# Patient Record
Sex: Female | Born: 1940 | Race: White | Hispanic: No | Marital: Married | Smoking: Never smoker
Health system: Southern US, Community
[De-identification: ages and names within clinical notes are randomized; demographics above are authoritative.]

## PROBLEM LIST (undated history)

## (undated) DIAGNOSIS — I5020 Unspecified systolic (congestive) heart failure: Secondary | ICD-10-CM

## (undated) DIAGNOSIS — I350 Nonrheumatic aortic (valve) stenosis: Secondary | ICD-10-CM

## (undated) DIAGNOSIS — E46 Unspecified protein-calorie malnutrition: Secondary | ICD-10-CM

## (undated) DIAGNOSIS — E7800 Pure hypercholesterolemia, unspecified: Secondary | ICD-10-CM

## (undated) DIAGNOSIS — I340 Nonrheumatic mitral (valve) insufficiency: Secondary | ICD-10-CM

## (undated) DIAGNOSIS — I480 Paroxysmal atrial fibrillation: Secondary | ICD-10-CM

## (undated) DIAGNOSIS — C50919 Malignant neoplasm of unspecified site of unspecified female breast: Secondary | ICD-10-CM

## (undated) HISTORY — DX: Nonrheumatic aortic (valve) stenosis: I35.0

## (undated) HISTORY — DX: Pure hypercholesterolemia, unspecified: E78.00

## (undated) HISTORY — PX: NO PAST SURGERIES: SHX2092

---

## 1998-01-06 ENCOUNTER — Other Ambulatory Visit: Admission: RE | Admit: 1998-01-06 | Discharge: 1998-01-06 | Payer: Self-pay | Admitting: *Deleted

## 1998-03-12 ENCOUNTER — Ambulatory Visit: Admission: RE | Admit: 1998-03-12 | Discharge: 1998-03-12 | Payer: Self-pay | Admitting: General Surgery

## 1998-03-12 ENCOUNTER — Encounter: Payer: Self-pay | Admitting: General Surgery

## 1998-03-19 ENCOUNTER — Ambulatory Visit (HOSPITAL_COMMUNITY): Admission: RE | Admit: 1998-03-19 | Discharge: 1998-03-19 | Payer: Self-pay | Admitting: General Surgery

## 1999-01-07 ENCOUNTER — Other Ambulatory Visit: Admission: RE | Admit: 1999-01-07 | Discharge: 1999-01-07 | Payer: Self-pay | Admitting: *Deleted

## 2000-02-07 ENCOUNTER — Other Ambulatory Visit: Admission: RE | Admit: 2000-02-07 | Discharge: 2000-02-07 | Payer: Self-pay | Admitting: *Deleted

## 2001-03-21 ENCOUNTER — Other Ambulatory Visit: Admission: RE | Admit: 2001-03-21 | Discharge: 2001-03-21 | Payer: Self-pay | Admitting: *Deleted

## 2011-10-04 ENCOUNTER — Other Ambulatory Visit: Payer: Self-pay | Admitting: Family Medicine

## 2011-10-04 DIAGNOSIS — Z1211 Encounter for screening for malignant neoplasm of colon: Secondary | ICD-10-CM

## 2011-10-13 ENCOUNTER — Ambulatory Visit
Admission: RE | Admit: 2011-10-13 | Discharge: 2011-10-13 | Disposition: A | Discharge status: Home / Self Care | Payer: Federal, State, Local not specified - PPO | Source: Ambulatory Visit | Attending: Family Medicine | Admitting: Family Medicine

## 2011-10-13 DIAGNOSIS — Z1211 Encounter for screening for malignant neoplasm of colon: Secondary | ICD-10-CM

## 2017-05-26 ENCOUNTER — Other Ambulatory Visit: Payer: Self-pay | Admitting: Gastroenterology

## 2017-05-26 DIAGNOSIS — K921 Melena: Secondary | ICD-10-CM

## 2017-05-31 ENCOUNTER — Other Ambulatory Visit: Payer: Self-pay

## 2017-07-12 ENCOUNTER — Other Ambulatory Visit: Payer: Federal, State, Local not specified - PPO

## 2017-08-07 ENCOUNTER — Other Ambulatory Visit: Payer: Federal, State, Local not specified - PPO

## 2017-10-02 ENCOUNTER — Other Ambulatory Visit: Payer: Federal, State, Local not specified - PPO

## 2017-10-26 ENCOUNTER — Other Ambulatory Visit: Payer: Federal, State, Local not specified - PPO

## 2017-11-14 ENCOUNTER — Other Ambulatory Visit: Payer: Federal, State, Local not specified - PPO

## 2017-11-29 ENCOUNTER — Other Ambulatory Visit: Payer: Federal, State, Local not specified - PPO

## 2018-01-07 ENCOUNTER — Other Ambulatory Visit: Payer: Federal, State, Local not specified - PPO

## 2018-01-30 ENCOUNTER — Inpatient Hospital Stay
Admission: RE | Admit: 2018-01-30 | Discharge: 2018-01-30 | Disposition: A | Discharge status: Home / Self Care | Payer: Federal, State, Local not specified - PPO | Source: Ambulatory Visit | Attending: Gastroenterology | Admitting: Gastroenterology

## 2018-02-01 ENCOUNTER — Ambulatory Visit
Admission: RE | Admit: 2018-02-01 | Discharge: 2018-02-01 | Disposition: A | Discharge status: Home / Self Care | Payer: Federal, State, Local not specified - PPO | Source: Ambulatory Visit | Attending: Gastroenterology | Admitting: Gastroenterology

## 2018-02-01 DIAGNOSIS — K921 Melena: Secondary | ICD-10-CM

## 2019-05-29 IMAGING — CT CT VIRTUAL COLONOSCOPY DIAGNOSTIC
3 of 9 series · 15 of 46 positions shown, 17 images · non-contrast
Comparison: 10/13/2011

CLINICAL DATA: Blood in stool.

EXAM:
CT VIRTUAL COLONOSCOPY DIAGNOSTIC
TECHNIQUE: The patient was given a standard bowel preparation with Gastrografin
and barium for fluid and stool tagging respectively. The quality of
the bowel preparation is moderate. Automated CO2 insufflation of the
colon was performed prior to image acquisition and colonic
distention is moderate. Image post processing was used to generate a
3D endoluminal fly-through projection of the colon and to
electronically subtract stool/fluid as appropriate.

[Series 4: supine colon 1.50 br40 s3 supine thins · axial · 0.68mm/px · z∈[+1332,+1377]mm · 2 of 447 slices shown]
[im 45/447  soft-tissue]
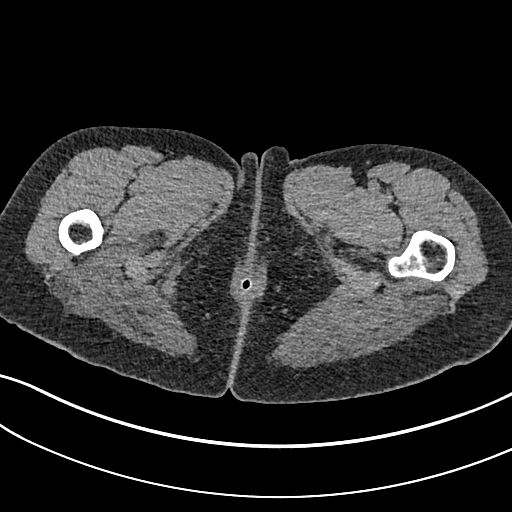
[im 90/447  soft-tissue]
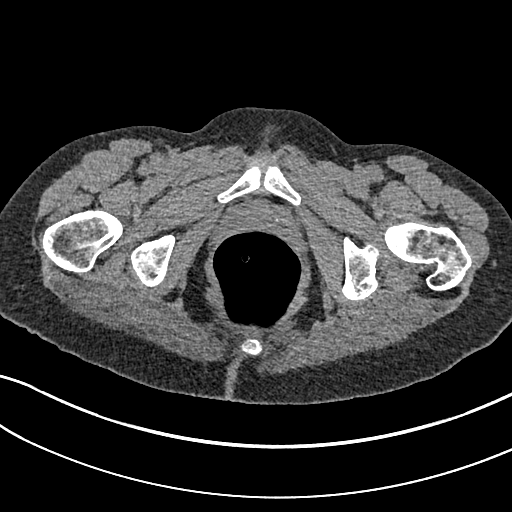

[Series 6: supine colon 3.00 br40 s3 cor cor supine · coronal · 0.68mm/px · 3 of 115 slices shown]
[im 29/115  soft-tissue]
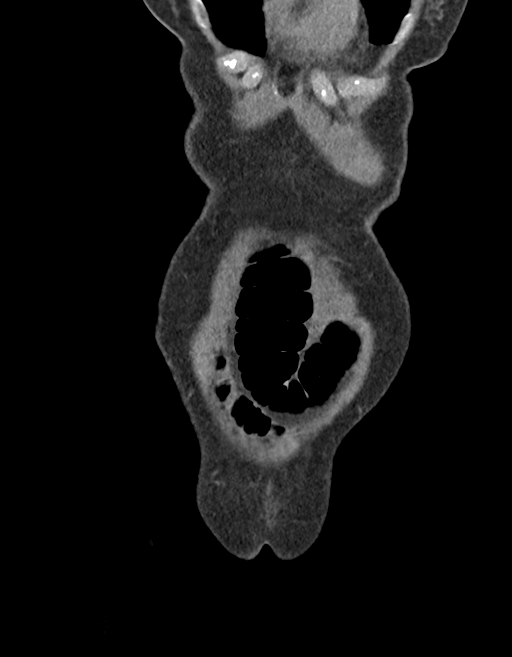
[im 58/115  soft-tissue]
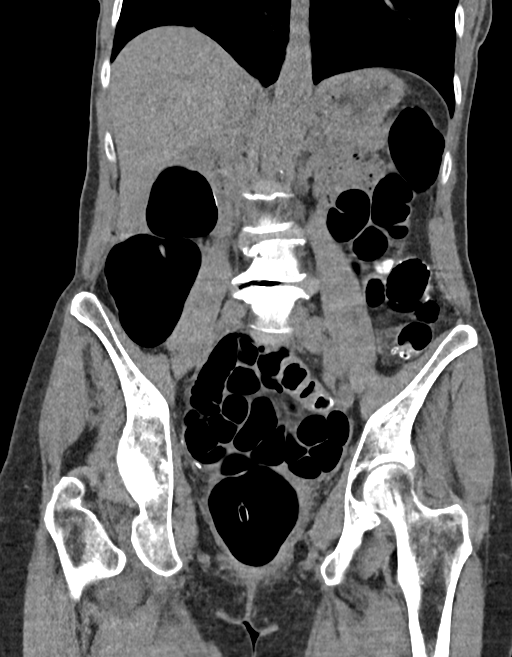
[im 86/115  soft-tissue]
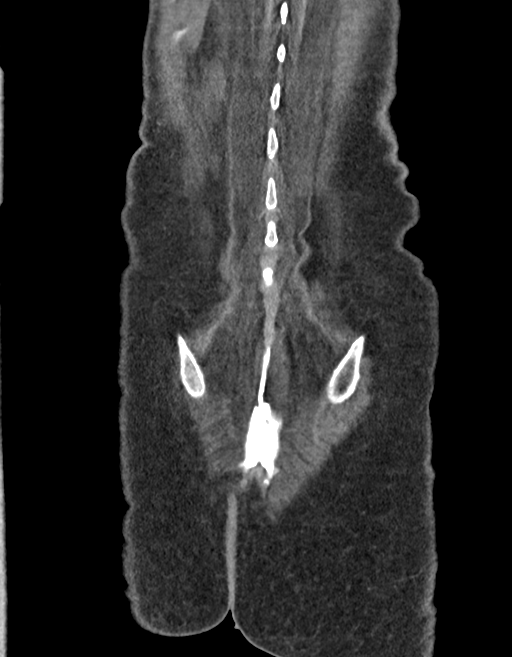

[Series 11: prone colon 1.50 br40 s3 prone thin · axial · 0.62mm/px · z∈[+1509,+1882]mm · 10 of 457 slices shown, 12 images]
[im 42/457  soft-tissue]
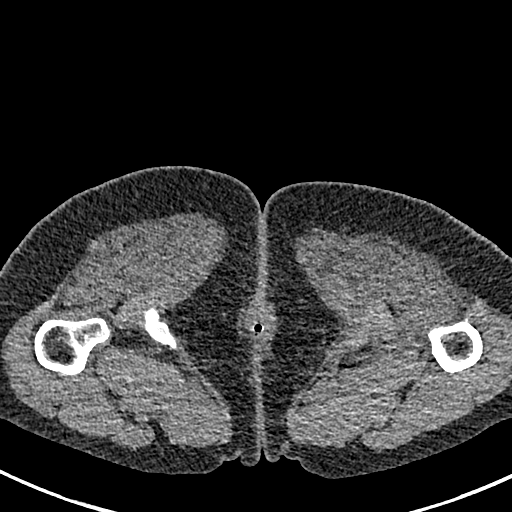
[im 42/457  bone]
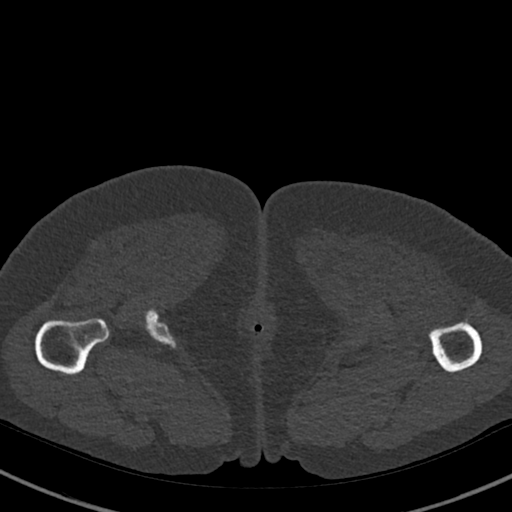
[im 83/457  soft-tissue]
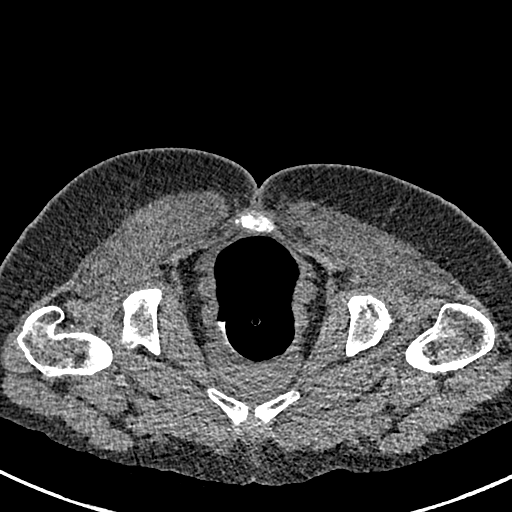
[im 125/457  soft-tissue]
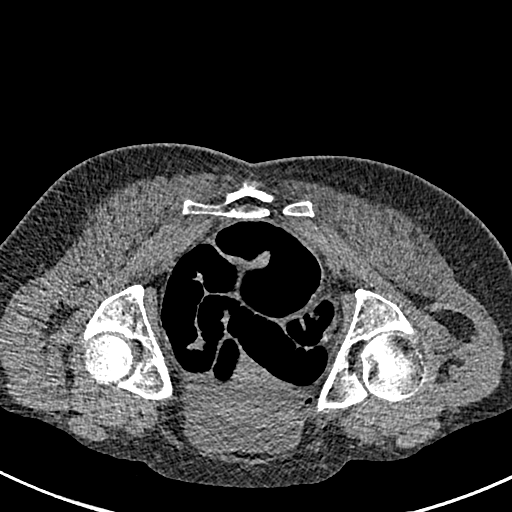
[im 166/457  soft-tissue]
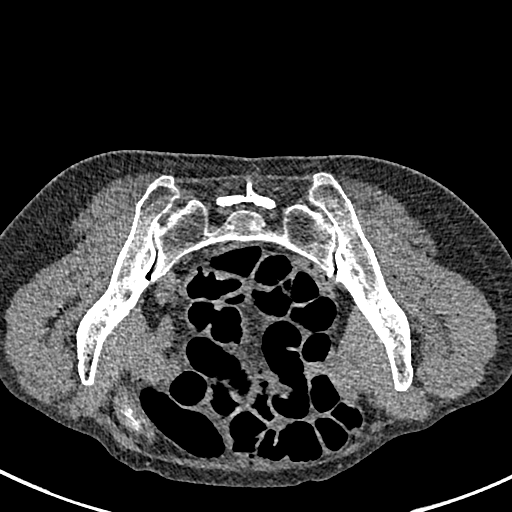
[im 208/457  soft-tissue]
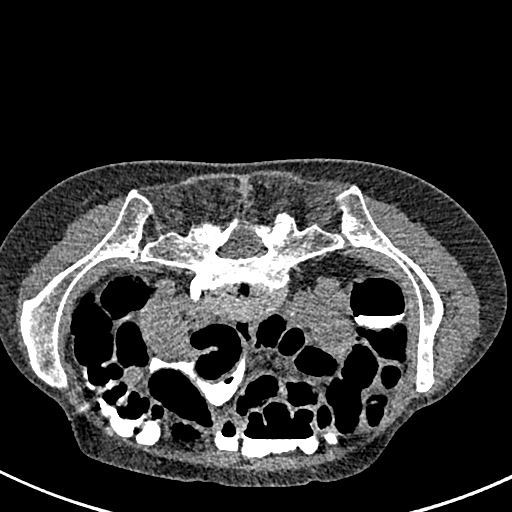
[im 249/457  soft-tissue]
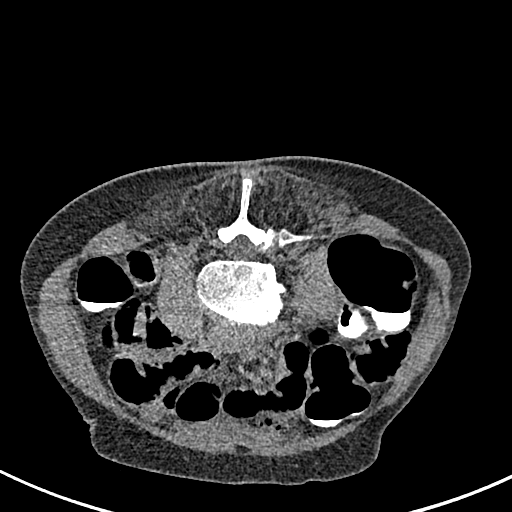
[im 291/457  soft-tissue]
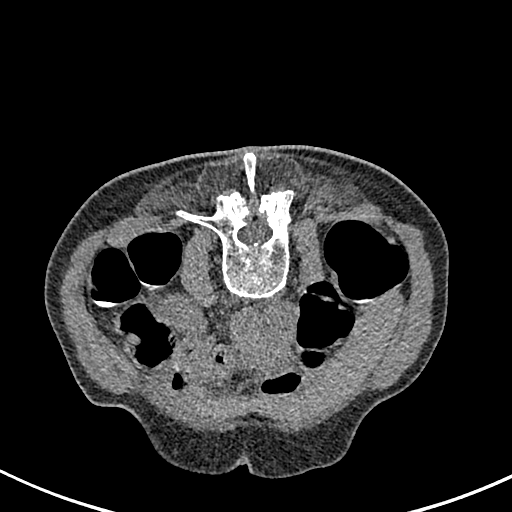
[im 332/457  soft-tissue]
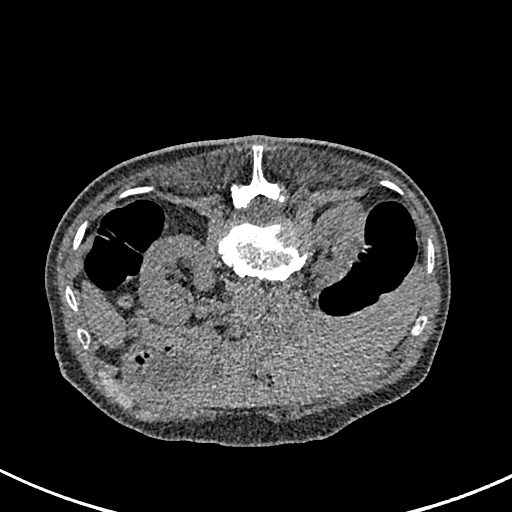
[im 374/457  soft-tissue]
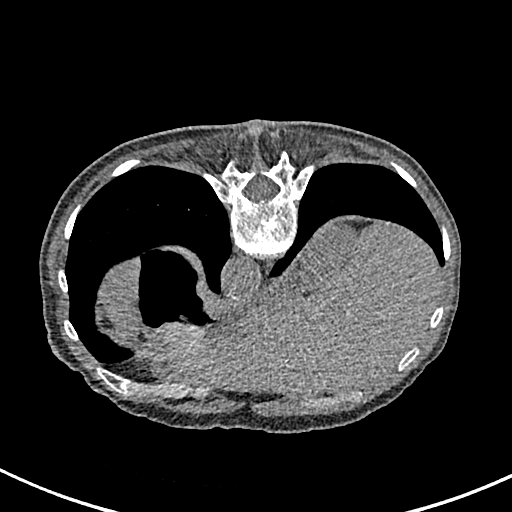
[im 374/457  bone]
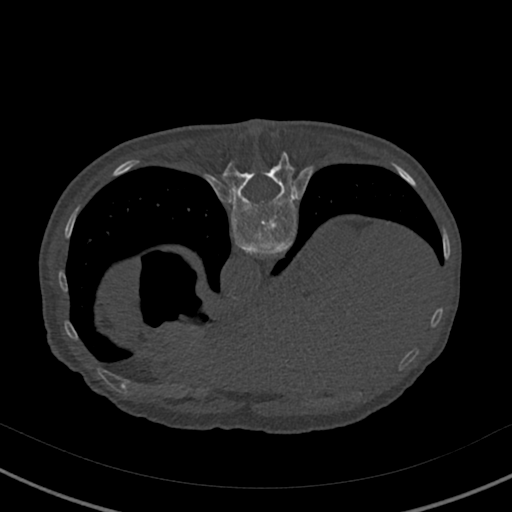
[im 415/457  soft-tissue]
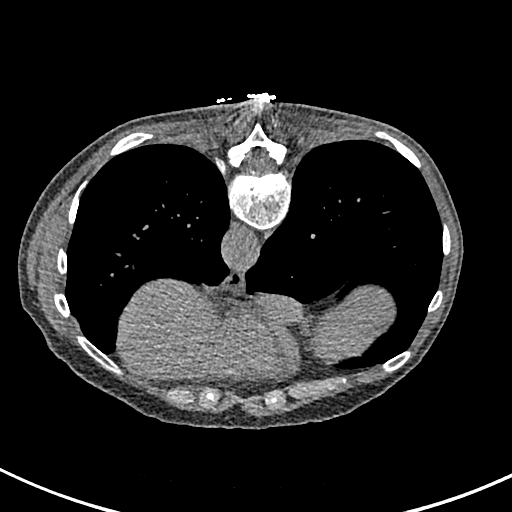

[15 of 46 positions shown; findings below may reference images not displayed]

FINDINGS: VIRTUAL COLONOSCOPY

On prone imaging, the sigmoid and distal most descending colon are
underdistended, without circumferential/annular mass. On prone
imaging, the similar segments are underdistended. In addition,
portions of the mid and distal transverse colon are underdistended
and fluid-filled. Given these limitations, no evidence of clinically
significant colonic polyp or mass. Scattered colonic diverticula.

Virtual colonoscopy is not designed to detect diminutive polyps
(i.e., less than or equal to 5 mm), the presence or absence of which
may not affect clinical management.

CT ABDOMEN AND PELVIS WITHOUT CONTRAST

Lower chest: Clear lung bases. Normal heart size without pericardial
or pleural effusion.

Hepatobiliary: Normal liver. Gallbladder poorly visualized, likely
identified on image 37/5. No biliary duct dilatation.

Pancreas: Normal, without mass or ductal dilatation.

Spleen: Normal in size, without focal abnormality.

Adrenals/Urinary Tract: Normal adrenal glands. No renal calculi or
hydronephrosis. No bladder calculi.

Stomach/Bowel: Normal stomach, without wall thickening. Normal
appendix. Normal small bowel caliber.

Vascular/Lymphatic: Aortic atherosclerosis. No abdominopelvic
adenopathy.

Reproductive: Hysterectomy versus uterine atrophy.  No adnexal mass.

Other: No significant free fluid.  No free intraperitoneal air.

Musculoskeletal: Advanced degenerate disc disease at L4-5. S-shaped
lumbar spine curvature.
IMPRESSION: 1. Suboptimal colonic distension, especially involving the sigmoid.
Given this limitation, no evidence of colonic polyp or mass.
2.  No acute process in the abdomen or pelvis.

## 2020-05-12 ENCOUNTER — Ambulatory Visit: Payer: Federal, State, Local not specified - PPO | Admitting: Cardiology

## 2020-05-28 ENCOUNTER — Ambulatory Visit: Payer: Federal, State, Local not specified - PPO | Admitting: Cardiology

## 2020-07-21 ENCOUNTER — Encounter: Payer: Self-pay | Admitting: Cardiology

## 2020-07-21 ENCOUNTER — Other Ambulatory Visit: Payer: Self-pay

## 2020-07-21 ENCOUNTER — Ambulatory Visit: Payer: Federal, State, Local not specified - PPO | Admitting: Cardiology

## 2020-07-21 DIAGNOSIS — I071 Rheumatic tricuspid insufficiency: Secondary | ICD-10-CM | POA: Insufficient documentation

## 2020-07-21 DIAGNOSIS — R0600 Dyspnea, unspecified: Secondary | ICD-10-CM

## 2020-07-21 DIAGNOSIS — E785 Hyperlipidemia, unspecified: Secondary | ICD-10-CM

## 2020-07-21 DIAGNOSIS — R0609 Other forms of dyspnea: Secondary | ICD-10-CM | POA: Insufficient documentation

## 2020-07-21 DIAGNOSIS — I361 Nonrheumatic tricuspid (valve) insufficiency: Secondary | ICD-10-CM

## 2020-07-21 HISTORY — DX: Dyspnea, unspecified: R06.00

## 2020-07-21 HISTORY — DX: Nonrheumatic tricuspid (valve) insufficiency: I36.1

## 2020-07-21 HISTORY — DX: Hyperlipidemia, unspecified: E78.5

## 2020-07-21 HISTORY — DX: Other forms of dyspnea: R06.09

## 2020-07-21 NOTE — Progress Notes (Signed)
Cardiology Consultation:    Date:  07/21/2020   ID:  Molli Posey, DOB 12-21-1940, MRN [id number]  PCP:  Carol Ada, MD  Cardiologist:  Jenne Campus, MD   Referring MD: Carol Ada, MD   Chief Complaint  Patient presents with  . Moderate to severe tricuspid regurgitation    History of Present Illness:    Tara Hardin is a 80 y.o. female who is being seen today for the evaluation of the cuspid regurgitation at the request of Carol Ada, MD.  She is a nice lady who was referred to [place] because of tricuspid regurgitation which being reported to be moderate to severe.  It is completely surprised by this findings and actually she tells me she is not sure exactly why she is here.  Overall she seems to be doing well.  She said she was always very athletic shoes to exercise and regular basis have no difficulty doing this but within last few years cannot get tired and taking care of her husband and that is what keep her occupied.  Denies having any swelling of lower extremities there is no proximal nocturnal dyspnea there is no distention or pain in the abdomen.  She still trying to be active and take activity of daily living with not much difficulties.  Past Medical History:  Diagnosis Date  . High cholesterol     Past Surgical History:  Procedure Laterality Date  . NO PAST SURGERIES      Current Medications: Current Meds  Medication Sig  . amoxicillin-clavulanate (AUGMENTIN) 875-125 MG tablet Take 1 tablet by mouth 2 (two) times daily. For 7 days  . aspirin 81 MG tablet Take 81 mg by mouth daily.  . Multiple Vitamin (MULTIVITAMIN) tablet Take 1 tablet by mouth daily.  . Omega-3 Fatty Acids (FISH OIL) 1000 MG CAPS Take by mouth.  . Progesterone Micronized (PROGESTERONE PO) Take by mouth.  . Red Yeast Rice Extract (RED YEAST RICE PO) Take by mouth.     Allergies:   Patient has no known allergies.   Social History   Socioeconomic History  . Marital status: Married     Spouse name: Not on file  . Number of children: Not on file  . Years of education: Not on file  . Highest education level: Not on file  Occupational History  . Not on file  Tobacco Use  . Smoking status: Never Smoker  . Smokeless tobacco: Never Used  Substance and Sexual Activity  . Alcohol use: Not on file  . Drug use: Not on file  . Sexual activity: Not on file  Other Topics Concern  . Not on file  Social History Narrative  . Not on file   Social Determinants of Health   Financial Resource Strain: Not on file  Food Insecurity: Not on file  Transportation Needs: Not on file  Physical Activity: Not on file  Stress: Not on file  Social Connections: Not on file     Family History: The patient's family history includes Atrial fibrillation in her brother; Heart attack in her brother and sister; Hypertension in her brother and sister; Irregular heart beat in her brother. ROS:   Please see the history of present illness.    All 14 point review of systems negative except as described per history of present illness.  EKGs/Labs/Other Studies Reviewed:    The following studies were reviewed today:   EKG:  EKG is  ordered today.  The ekg ordered today demonstrates normal sinus  rhythm first-degree AV block, nonspecific intraventricular conduction delay  Recent Labs: No results found for requested labs within last 8760 hours.  Recent Lipid Panel No results found for: CHOL, TRIG, HDL, CHOLHDL, VLDL, LDLCALC, LDLDIRECT  Physical Exam:    VS:  BP 106/78 (BP Location: Right Arm, Patient Position: Sitting)   Pulse 82   Ht 5\' 4"  (1.626 m)   Wt 119 lb (54 kg)   SpO2 96%   BMI 20.43 kg/m     Wt Readings from Last 3 Encounters:  07/21/20 119 lb (54 kg)     GEN:  Well nourished, well developed in no acute distress HEENT: Normal NECK: No JVD; No carotid bruits LYMPHATICS: No lymphadenopathy CARDIAC: RRR, holosystolic murmur grade 1/6 to 2/6 best heard right lower portion  of the sternum as well as apex with some radiation towards axilla., no rubs, no gallops RESPIRATORY:  Clear to auscultation without rales, wheezing or rhonchi  ABDOMEN: Soft, non-tender, non-distended MUSCULOSKELETAL:  No edema; No deformity  SKIN: Warm and dry NEUROLOGIC:  Alert and oriented x 3 PSYCHIATRIC:  Normal affect   ASSESSMENT:    1. Nonrheumatic tricuspid valve regurgitation   2. Dyslipidemia   3. Dyspnea on exertion    PLAN:    In order of problems listed above:  1. Nonrheumatic tricuspid valve regurgitation.  I do not have her echocardiogram I requested a copy of it.  It was done apparently in October.  I suspect we will have to repeat echocardiogram to reassess the degree of tricuspid regurgitation.  At least on the physical examination she does have any signs and symptoms of right side failure.  There is no swelling of lower extremities there is JVD only when she is lying flat.  There is hepatojugular reflux however when she is sitting 45 degree angle.  I do not see an indication for medications at this moment but again assessment of tricuspid valve need to happen I will start for reviewing echocardiogram done by primary care physician we may be forced to repeat echocardiogram or go to more advanced technique try to assess degree of tricuspid regurgitation. 2. Dyslipidemia she is taking red yeast rice.  Will call primary care physician to get her fasting lipid profile. 3. Dyspnea on exertion multifactorial.  Deconditioning may play some role here but again the key is to review her echocardiogram which I will do.  I explained to her we will try to get a copy of her echocardiogram based on that we will decide course of action which may include repeating echocardiogram.  I will see her back within next few weeks or sooner if she has a problem.   Medication Adjustments/Labs and Tests Ordered: Current medicines are reviewed at length with the patient today.  Concerns regarding  medicines are outlined above.  No orders of the defined types were placed in this encounter.  No orders of the defined types were placed in this encounter.   Signed, Park Liter, MD, [facility]. 07/21/2020 [place]

## 2020-07-21 NOTE — Patient Instructions (Signed)

## 2020-07-26 ENCOUNTER — Telehealth: Payer: Self-pay | Admitting: Cardiology

## 2020-07-26 NOTE — Telephone Encounter (Signed)
Patient states she had an ultrasound with Dr. Duwaine Maxin Smith's office in November. She states the results have been faxed to the office and she would like to ensure that we received them. She is hoping Dr. Agustin Cree can review them and return her call to discuss.

## 2020-07-26 NOTE — Telephone Encounter (Signed)
Called patient informed her we do not have she will request another copy be sent to [place].

## 2020-08-30 ENCOUNTER — Other Ambulatory Visit: Payer: Self-pay

## 2020-08-30 DIAGNOSIS — I358 Other nonrheumatic aortic valve disorders: Secondary | ICD-10-CM | POA: Insufficient documentation

## 2020-08-30 DIAGNOSIS — E785 Hyperlipidemia, unspecified: Secondary | ICD-10-CM | POA: Insufficient documentation

## 2020-08-30 DIAGNOSIS — N6042 Mammary duct ectasia of left breast: Secondary | ICD-10-CM | POA: Insufficient documentation

## 2020-08-30 DIAGNOSIS — R011 Cardiac murmur, unspecified: Secondary | ICD-10-CM | POA: Insufficient documentation

## 2020-08-30 DIAGNOSIS — K921 Melena: Secondary | ICD-10-CM

## 2020-08-30 DIAGNOSIS — R6883 Chills (without fever): Secondary | ICD-10-CM | POA: Insufficient documentation

## 2020-08-30 DIAGNOSIS — R7303 Prediabetes: Secondary | ICD-10-CM

## 2020-08-30 HISTORY — DX: Cardiac murmur, unspecified: R01.1

## 2020-08-30 HISTORY — DX: Mammary duct ectasia of left breast: N60.42

## 2020-08-30 HISTORY — DX: Prediabetes: R73.03

## 2020-08-30 HISTORY — DX: Other nonrheumatic aortic valve disorders: I35.8

## 2020-08-30 HISTORY — DX: Melena: K92.1

## 2020-08-30 HISTORY — DX: Chills (without fever): R68.83

## 2020-09-01 ENCOUNTER — Ambulatory Visit: Payer: Federal, State, Local not specified - PPO | Admitting: Cardiology

## 2020-09-15 ENCOUNTER — Telehealth: Payer: Self-pay | Admitting: Cardiology

## 2020-09-15 NOTE — Telephone Encounter (Signed)
Patient called and wants Transfer of Care from Dr. Agustin Cree to Dr. Angelena Form. Please confirm transfer

## 2020-09-16 NOTE — Telephone Encounter (Signed)
OK with me Chris  

## 2020-09-17 NOTE — Telephone Encounter (Signed)
I spoke with patient. She is feeling fine at current time.  I told her Dr Camillia Herter nurse would contact her next week regarding an appointment.

## 2020-09-17 NOTE — Telephone Encounter (Signed)
Patient is a previous patient of Dr. Agustin Cree. Got confirmation of Transfer of Care to be a patient of Dr. Angelena Form. Patient called to schedule appt for [facility] but no availability is current. Patient was made aware. Still would like for nurse to give her a call.

## 2020-09-22 NOTE — Telephone Encounter (Signed)
The patient was seen in 07/2020 w plan to see her back 10/06/20.  This appointment has been cancelled and the pt now plans to f/u with Dr. Angelena Form.  I called PCP office and requested copy of recent echo.  This was done 03/2020.  PCP office will fax copy to me at [phone].

## 2020-09-22 NOTE — Telephone Encounter (Signed)
Follow Up:    Pt was calling back to see what was decided about getting a sooner appointment with Dr Angelena Form.

## 2020-10-06 ENCOUNTER — Ambulatory Visit: Payer: Federal, State, Local not specified - PPO | Admitting: Cardiology

## 2020-12-09 ENCOUNTER — Ambulatory Visit: Payer: Federal, State, Local not specified - PPO | Admitting: Cardiovascular Disease

## 2020-12-09 ENCOUNTER — Other Ambulatory Visit: Payer: Self-pay

## 2020-12-09 ENCOUNTER — Encounter: Payer: Self-pay | Admitting: Cardiovascular Disease

## 2020-12-09 VITALS — BP 136/70 | HR 81 | Ht 64.0 in | Wt 120.0 lb

## 2020-12-09 DIAGNOSIS — I071 Rheumatic tricuspid insufficiency: Secondary | ICD-10-CM | POA: Diagnosis not present

## 2020-12-09 NOTE — Patient Instructions (Addendum)
Medication Instructions:  *If you need a refill on your cardiac medications before your next appointment, please call your pharmacy*  Lab Work: If you have labs (blood work) drawn today and your tests are completely normal, you will receive your results only by: [place] (if you have MyChart) OR A paper copy in the mail If you have any lab test that is abnormal or we need to change your treatment, we will call you to review the results.  Testing/Procedures: Your physician has requested that you have an echocardiogram. Echocardiography is a painless test that uses sound waves to create images of your heart. It provides your doctor with information about the size and shape of your heart and how well your heart's chambers and valves are working. This procedure takes approximately one hour. There are no restrictions for this procedure.  Follow-Up: At [facility] you and your health needs are our priority.  As part of our continuing mission to provide you with exceptional heart care, we have created designated Provider Care Teams.  These Care Teams include your primary Cardiologist (physician) and Advanced Practice Providers (APPs -  Physician Assistants and Nurse Practitioners) who all work together to provide you with the care you need, when you need it.  We recommend signing up for the patient portal called "MyChart".  Sign up information is provided on this After Visit Summary.  MyChart is used to connect with patients for Virtual Visits (Telemedicine).  Patients are able to view lab/test results, encounter notes, upcoming appointments, etc.  Non-urgent messages can be sent to your provider as well.   To learn more about what you can do with MyChart, go to [email or web address].    Your next appointment:   6 month(s)  The format for your next appointment:   In Person  Provider:   You may see Dr. Angelena Form or one of the following Advanced Practice Providers on your designated Care  Team:   Melina Copa, PA-C Ermalinda Barrios, PA-C

## 2020-12-09 NOTE — Progress Notes (Signed)
Chief Complaint  Patient presents with   Follow-up    Tricuspid regurgitation    History of Present Illness: 80 yo female with history of hyperlipidemia, tricuspid valve regurgitation here today for follow up. I am meeting her for the first time today. She has been evaluated in March 2022 by my partner in [place], [place] Dr. Agustin Cree. Echo in outside facility in October 2021 with moderate to severe TR. No echo in our system. She was asymptomatic when seen by Dr. Agustin Cree in March 2022.   She is here today for follow up. The patient denies any chest pain, dyspnea, palpitations, lower extremity edema, orthopnea, PND, dizziness, near syncope or syncope.    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     fluticasone (FLONASE) 50 MCG/ACT nasal spray Place 2 sprays into both nostrils as needed.     Multiple Vitamin (MULTIVITAMIN) tablet Take 1 tablet by mouth daily.     Omega-3 Fatty Acids (FISH OIL) 1000 MG CAPS Take by mouth.     Progesterone Micronized (PROGESTERONE PO) Take by mouth.     Red Yeast Rice Extract (RED YEAST RICE PO) Take by mouth.     triamcinolone ointment (KENALOG) 0.1 % Apply 1 application topically 2 (two) times daily.     rosuvastatin (CRESTOR) 5 MG tablet Take 1 tablet by mouth daily. (Patient not taking: Reported on 12/09/2020)     No current facility-administered medications for this visit.    No Known  Allergies  Social History   Socioeconomic History   Marital status: Married    Spouse name: Not on file   Number of children: 3   Years of education: Not on file   Highest education level: Not on file  Occupational History   Occupation: Retired Nurse, adult  Tobacco Use   Smoking status: Never   Smokeless tobacco: Never  Substance and Sexual Activity   Alcohol use: Not on file   Drug use: Not on file   Sexual activity: Not on file  Other Topics Concern   Not on file  Social History Narrative   Not on file   Social Determinants of Health   Financial Resource Strain: Not on file  Food Insecurity: Not on file  Transportation Needs: Not on file  Physical Activity: Not on file  Stress: Not on file  Social Connections: Not on file  Intimate Partner Violence: Not on file    Family History  Problem Relation Age of Onset   Heart attack Sister    Atrial fibrillation Brother    Hypertension Brother    Heart attack Brother    Irregular heart beat Brother    Hypertension Sister     Review of Systems:  As stated in the HPI and otherwise negative.   BP 136/70   Pulse 81   Ht '5\' 4"'$  (1.626 m)   Wt 120 lb (54.4 kg)  SpO2 97%   BMI 20.60 kg/m   Physical Examination: General: Well developed, well nourished, NAD  HEENT: OP clear, mucus membranes moist  SKIN: warm, dry. No rashes. Neuro: No focal deficits  Musculoskeletal: Muscle strength 5/5 all ext  Psychiatric: Mood and affect normal  Neck: No JVD, no carotid bruits, no thyromegaly, no lymphadenopathy.  Lungs:Clear bilaterally, no wheezes, rhonci, crackles Cardiovascular: Regular rate and rhythm. Systolic murmurs, gallops or rubs. Abdomen:Soft. Bowel sounds present. Non-tender.  Extremities: No lower extremity edema.   EKG:  EKG is not ordered today. The ekg ordered today demonstrates   Recent Labs: No results found for requested labs within last 8760 hours.   Lipid Panel No results found for: CHOL, TRIG, HDL,  CHOLHDL, VLDL, LDLCALC, LDLDIRECT   Wt Readings from Last 3 Encounters:  12/09/20 120 lb (54.4 kg)  07/21/20 119 lb (54 kg)    Assessment and Plan:   1. Tricuspid regurgitation: She has no evidence of right heart failure of volume overload. I will plan to repeat her echo. No other intervention at this time.   Current medicines are reviewed at length with the patient today.  The patient does not have concerns regarding medicines.  The following changes have been made:  no change  Labs/ tests ordered today include:   Orders Placed This Encounter  Procedures   ECHOCARDIOGRAM COMPLETE      Disposition:   F/U with me in 6 months.    Signed, Lauree Chandler, MD 12/09/2020 4:12 PM    [facility] [address], [place], [place]  [address] Phone: [phone]; Fax: [phone]

## 2020-12-23 ENCOUNTER — Ambulatory Visit (HOSPITAL_COMMUNITY): Payer: Federal, State, Local not specified - PPO | Attending: Cardiology

## 2020-12-23 ENCOUNTER — Encounter (HOSPITAL_COMMUNITY): Payer: Self-pay

## 2021-01-13 ENCOUNTER — Other Ambulatory Visit (HOSPITAL_COMMUNITY): Payer: Federal, State, Local not specified - PPO

## 2021-01-27 ENCOUNTER — Ambulatory Visit (HOSPITAL_COMMUNITY): Payer: Federal, State, Local not specified - PPO

## 2021-02-14 ENCOUNTER — Ambulatory Visit (HOSPITAL_COMMUNITY): Payer: Federal, State, Local not specified - PPO

## 2021-02-24 ENCOUNTER — Ambulatory Visit (HOSPITAL_COMMUNITY): Payer: Federal, State, Local not specified - PPO | Attending: Cardiology

## 2021-04-04 ENCOUNTER — Other Ambulatory Visit: Payer: Self-pay

## 2021-04-04 ENCOUNTER — Other Ambulatory Visit (HOSPITAL_COMMUNITY): Payer: Federal, State, Local not specified - PPO

## 2021-04-04 ENCOUNTER — Ambulatory Visit (HOSPITAL_BASED_OUTPATIENT_CLINIC_OR_DEPARTMENT_OTHER)
Admission: RE | Admit: 2021-04-04 | Discharge: 2021-04-04 | Disposition: A | Discharge status: Home / Self Care | Payer: Federal, State, Local not specified - PPO | Source: Ambulatory Visit | Attending: Cardiovascular Disease | Admitting: Cardiovascular Disease

## 2021-04-04 DIAGNOSIS — I071 Rheumatic tricuspid insufficiency: Secondary | ICD-10-CM | POA: Diagnosis not present

## 2021-04-04 LAB — ECHOCARDIOGRAM COMPLETE
AR max vel: 1.08 cm2
AV Area VTI: 0.93 cm2
AV Area mean vel: 0.94 cm2
AV Mean grad: 23.7 mmHg
AV Peak grad: 39.7 mmHg
Ao pk vel: 3.15 m/s
Area-P 1/2: 4.15 cm2
Calc EF: 59.4 %
P 1/2 time: 612 msec
S' Lateral: 3.2 cm
Single Plane A2C EF: 65.9 %
Single Plane A4C EF: 59.9 %

## 2021-04-11 ENCOUNTER — Other Ambulatory Visit (HOSPITAL_BASED_OUTPATIENT_CLINIC_OR_DEPARTMENT_OTHER): Payer: Federal, State, Local not specified - PPO

## 2021-06-01 ENCOUNTER — Other Ambulatory Visit: Payer: Self-pay

## 2021-06-01 ENCOUNTER — Encounter: Payer: Self-pay | Admitting: Cardiovascular Disease

## 2021-06-01 ENCOUNTER — Ambulatory Visit: Payer: Federal, State, Local not specified - PPO | Admitting: Cardiovascular Disease

## 2021-06-01 VITALS — BP 142/72 | HR 89 | Ht 64.0 in | Wt 118.2 lb

## 2021-06-01 DIAGNOSIS — I350 Nonrheumatic aortic (valve) stenosis: Secondary | ICD-10-CM

## 2021-06-01 NOTE — Patient Instructions (Signed)
Medication Instructions:  No changes *If you need a refill on your cardiac medications before your next appointment, please call your pharmacy*   Lab Work: none If you have labs (blood work) drawn today and your tests are completely normal, you will receive your results only by: [place] (if you have MyChart) OR A paper copy in the mail If you have any lab test that is abnormal or we need to change your treatment, we will call you to review the results.   Testing/Procedures: ECHO DUE NOV 2023 Your physician has requested that you have an echocardiogram. Echocardiography is a painless test that uses sound waves to create images of your heart. It provides your doctor with information about the size and shape of your heart and how well your hearts chambers and valves are working. This procedure takes approximately one hour. There are no restrictions for this procedure.   Follow-Up: At [facility], you and your health needs are our priority.  As part of our continuing mission to provide you with exceptional heart care, we have created designated Provider Care Teams.  These Care Teams include your primary Cardiologist (physician) and Advanced Practice Providers (APPs -  Physician Assistants and Nurse Practitioners) who all work together to provide you with the care you need, when you need it.  We recommend signing up for the patient portal called "MyChart".  Sign up information is provided on this After Visit Summary.  MyChart is used to connect with patients for Virtual Visits (Telemedicine).  Patients are able to view lab/test results, encounter notes, upcoming appointments, etc.  Non-urgent messages can be sent to your provider as well.   To learn more about what you can do with MyChart, go to [email or web address].    Your next appointment:   12 month(s)  The format for your next appointment:   In Person  Provider:   Lauree Chandler, MD

## 2021-06-01 NOTE — Progress Notes (Signed)
Chief Complaint  Patient presents with   Follow-up    Aortic valve disease     History of Present Illness: 81 yo female with history of hyperlipidemia, aortic stenosis and tricuspid valve regurgitation here today for follow up. I met her in July 2022.  She had been evaluated in March 2022 by Dr. Agustin Cree. Echo in outside facility in October 2021 with mention of moderate to severe TR. I could not see these images. She was asymptomatic when I saw her in July 2022. Echo November 2022 with LVEF=60-65%, mild AI, moderate aortic stenosis (mean gradient 23.7 mmHg). Mild tricuspid regurgitation.   She is here today for follow up. The patient denies any chest pain, dyspnea, palpitations, lower extremity edema, orthopnea, PND, dizziness, near syncope or syncope.     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     fluticasone (FLONASE) 50 MCG/ACT nasal spray Place 2 sprays into both nostrils as needed.     Multiple Vitamin (MULTIVITAMIN) tablet Take 1 tablet by mouth daily.     Omega-3 Fatty Acids (FISH OIL) 1000 MG CAPS Take by mouth.     Progesterone Micronized (PROGESTERONE PO) Take by mouth.     Red Yeast Rice Extract (RED YEAST RICE PO) Take by mouth.     triamcinolone ointment (KENALOG) 0.1 % Apply 1 application topically 2 (two) times daily.     No current facility-administered medications for this visit.    No Known  Allergies  Social History   Socioeconomic History   Marital status: Married    Spouse name: Not on file   Number of children: 3   Years of education: Not on file   Highest education level: Not on file  Occupational History   Occupation: Retired Nurse, adult  Tobacco Use   Smoking status: Never   Smokeless tobacco: Never  Substance and Sexual Activity   Alcohol use: Not on file   Drug use: Not on file   Sexual activity: Not on file  Other Topics Concern   Not on file  Social History Narrative   Not on file   Social Determinants of Health   Financial Resource Strain: Not on file  Food Insecurity: Not on file  Transportation Needs: Not on file  Physical Activity: Not on file  Stress: Not on file  Social Connections: Not on file  Intimate Partner Violence: Not on file    Family History  Problem Relation Age of Onset   Heart attack Sister    Atrial fibrillation Brother    Hypertension Brother    Heart attack Brother    Irregular heart beat Brother    Hypertension Sister     Review of Systems:  As stated in the HPI and otherwise negative.   BP (!) 142/72    Pulse 89    Ht $R'5\' 4"'gL$  (1.626 m)    Wt 118 lb 3.2 oz (  53.6 kg)    SpO2 95%    BMI 20.29 kg/m   Physical Examination: General: Well developed, well nourished, NAD  HEENT: OP clear, mucus membranes moist  SKIN: warm, dry. No rashes. Neuro: No focal deficits  Musculoskeletal: Muscle strength 5/5 all ext  Psychiatric: Mood and affect normal  Neck: No JVD, no carotid bruits, no thyromegaly, no lymphadenopathy.  Lungs:Clear bilaterally, no wheezes, rhonci, crackles Cardiovascular: Regular rate and rhythm. Systolic murmur.  Abdomen:Soft. Bowel sounds present. Non-tender.  Extremities: No lower extremity edema. Pulses are 2 + in the bilateral DP/PT.    EKG:  EKG is ordered today. The ekg ordered today demonstrates sinus with PACs, PVCs  Echo 04/04/21:  1. Left ventricular ejection fraction, by estimation, is 60  to 65%. The  left ventricle has normal function. The left ventricle has no regional  wall motion abnormalities. Left ventricular diastolic parameters are  consistent with Grade I diastolic  dysfunction (impaired relaxation).   2. Right ventricular systolic function is normal. The right ventricular  size is normal.   3. Left atrial size was moderately dilated.   4. The mitral valve is normal in structure. No evidence of mitral valve  regurgitation. No evidence of mitral stenosis.   5. The aortic valve is normal in structure. Aortic valve regurgitation is  mild. Moderate aortic valve stenosis.   6. The inferior vena cava is normal in size with greater than 50%  respiratory variability, suggesting right atrial pressure of 3 mmHg.   Recent Labs: No results found for requested labs within last 8760 hours.   Lipid Panel No results found for: CHOL, TRIG, HDL, CHOLHDL, VLDL, LDLCALC, LDLDIRECT   Wt Readings from Last 3 Encounters:  06/01/21 118 lb 3.2 oz (53.6 kg)  12/09/20 120 lb (54.4 kg)  07/21/20 119 lb (54 kg)    Assessment and Plan:   1. Tricuspid regurgitation: Mild TR by echo November 2022  2. Aortic stenosis: Moderate by echo November 2022. Repeat echo November 2023.   3. PVCs/PACs: Noted on EKG today  Current medicines are reviewed at length with the patient today.  The patient does not have concerns regarding medicines.  The following changes have been made:  no change  Labs/ tests ordered today include:   Orders Placed This Encounter  Procedures   EKG 12-Lead   ECHOCARDIOGRAM COMPLETE    Disposition:   F/U with me in 12 months.    Signed, Lauree Chandler, MD 06/02/2021 7:27 AM    [facility] [address], [place], [place]  [address] Phone: [phone]; Fax: [phone]

## 2021-06-15 DIAGNOSIS — C801 Malignant (primary) neoplasm, unspecified: Secondary | ICD-10-CM

## 2021-06-15 HISTORY — DX: Malignant (primary) neoplasm, unspecified: C80.1

## 2021-07-13 ENCOUNTER — Encounter: Payer: Self-pay | Admitting: Family Medicine

## 2021-07-13 ENCOUNTER — Telehealth: Payer: Self-pay | Admitting: Hematology and Oncology

## 2021-07-13 NOTE — Telephone Encounter (Signed)
Patient returned call to confirm afternoon clinic appointment for 3/8, [facility] will send packet ?

## 2021-07-15 ENCOUNTER — Encounter: Payer: Self-pay | Admitting: *Deleted

## 2021-07-18 ENCOUNTER — Other Ambulatory Visit: Payer: Self-pay | Admitting: *Deleted

## 2021-07-18 DIAGNOSIS — Z170 Estrogen receptor positive status [ER+]: Secondary | ICD-10-CM

## 2021-07-18 DIAGNOSIS — C50212 Malignant neoplasm of upper-inner quadrant of left female breast: Secondary | ICD-10-CM | POA: Insufficient documentation

## 2021-07-19 NOTE — Progress Notes (Signed)
?[facility] ?CONSULT NOTE ? ?Patient Care Team: ?Carol Ada, MD as PCP - General (Family Medicine) ?Mauro Kaufmann, RN as Oncology Nurse Navigator ?Rockwell Germany, RN as Oncology Nurse Navigator ?Jovita Kussmaul, MD as Consulting Physician (General Surgery) ?Nicholas Lose, MD as Consulting Physician (Hematology and Oncology) ?Kyung Rudd, MD as Consulting Physician (Radiation Oncology) ? ?CHIEF COMPLAINTS/PURPOSE OF CONSULTATION:  ?Newly diagnosed left breast mass ? ?HISTORY OF PRESENTING ILLNESS:  ?Tara Hardin 81 y.o. female is here because of recent diagnosis of mass of the left breast. Screening mammogram on 02/21/2021 showed 7 mm possible asymmetry at 8:00 in the left breast. Diagnostic mammogram and [place] on 05/11/2021 showed 0.6 x 0.7 cm round mass at 8:00 in the left breast. She presents to the clinic today for initial evaluation and discussion of treatment options.  ? ?I reviewed her records extensively and collaborated the history with the patient. ? ?SUMMARY OF ONCOLOGIC HISTORY: ?Oncology History  ?Malignant neoplasm of upper-inner quadrant of left breast in female, estrogen receptor positive ([place])  ?07/18/2021 Initial Diagnosis  ? Screening mammogram detected mass lower medial quadrant, 2 adjacent masses 1 cm and 0.9 cm (total 1.9 cm) at 9 o'clock position: Biopsy: Grade 2-3 IDC with DCIS, ER 50%, PR 50%, HER2 equivocal, FISH pending, Ki-67 1% ?  ?07/20/2021 Cancer Staging  ? Staging form: Breast, AJCC 8th Edition ?- Clinical stage from 07/20/2021: Stage IA (cT1b, cN0, cM0, G3, ER+, PR+, HER2-) - Signed by Hayden Pedro, PA-C on 07/20/2021 ?Stage prefix: Initial diagnosis ?Method of lymph node assessment: Clinical ?Histologic grading system: 3 grade system ? ?  ? ? ?MEDICAL HISTORY:  ?Past Medical History:  ?Diagnosis Date  ? 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 Cardiac murmur, unspecified 08/30/2020  ? Chill 08/30/2020  ? Dyslipidemia 07/21/2020  ? Dyspnea on exertion 07/21/2020  ?  Hematochezia 08/30/2020  ? High cholesterol   ? 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 Prediabetes 08/30/2020  ? ? ?SURGICAL HISTORY: ?Past Surgical History:  ?Procedure Laterality Date  ? NO PAST SURGERIES    ? ? ?SOCIAL HISTORY: ?Social History  ? ?Socioeconomic History  ? Marital status: Married  ?  Spouse name: Not on file  ? Number of children: 3  ? Years of education: Not on file  ? Highest education level: Not on file  ?Occupational History  ? Occupation: Retired Nurse, adult  ?Tobacco Use  ? Smoking status: Never  ? Smokeless tobacco: Never  ?Substance and Sexual Activity  ? Alcohol use: Not Currently  ? Drug use: Not on file  ? Sexual activity: Not on file  ?Other Topics Concern  ? Not on file  ?Social History Narrative  ? Not on file  ? ?Social Determinants of Health  ? ?Financial Resource Strain: Not on file  ?Food Insecurity: Not on file  ?Transportation Needs: Not on file  ?Physical Activity: Not on file  ?Stress: Not on file  ?Social Connections: Not on file  ?Intimate Partner Violence: Not on file  ? ? ?FAMILY HISTORY: ?Family History  ?Problem Relation Age of Onset  ? Heart attack Sister   ? Atrial fibrillation Brother   ? Hypertension Brother   ? Heart attack Brother   ? Irregular heart beat Brother   ? Hypertension Sister   ? ? ?ALLERGIES:  has No Known Allergies. ? ?MEDICATIONS:  ?Current Outpatient Medications  ?Medication Sig Dispense Refill  ? fluticasone (FLONASE) 50 MCG/ACT nasal  spray Place 2 sprays into both nostrils as needed.    ? Multiple Vitamin (MULTIVITAMIN) tablet Take 1 tablet by mouth daily.    ? Omega-3 Fatty Acids (FISH OIL) 1000 MG CAPS Take by mouth.    ? OVER THE COUNTER MEDICATION Take 5,000 mcg by mouth. Garlinase    ? Progesterone Micronized (PROGESTERONE PO) Take by mouth.    ? Red Yeast Rice Extract (RED YEAST RICE PO) Take by mouth.    ? triamcinolone ointment (KENALOG) 0.1 % Apply 1 application topically 2  (two) times daily.    ? ?No current facility-administered medications for this visit.  ? ? ?REVIEW OF SYSTEMS:   ?Constitutional: Denies fevers, chills or abnormal night sweats ?  ?All other systems were reviewed with the patient and are negative. ? ?PHYSICAL EXAMINATION: ?ECOG PERFORMANCE STATUS: 1 - Symptomatic but completely ambulatory ? ?Vitals:  ? 07/20/21 1317  ?BP: (!) 142/52  ?Pulse: 89  ?Resp: 18  ?Temp: 97.7 ?F (36.5 ?C)  ?SpO2: 100%  ? ?Filed Weights  ? 07/20/21 1317  ?Weight: 119 lb 6.4 oz (54.2 kg)  ?  ? ?LABORATORY DATA:  ?I have reviewed the data as listed ?Lab Results  ?Component Value Date  ? WBC 6.1 07/20/2021  ? HGB 12.1 07/20/2021  ? HCT 37.4 07/20/2021  ? MCV 94.0 07/20/2021  ? PLT 226 07/20/2021  ? ?Lab Results  ?Component Value Date  ? NA 142 07/20/2021  ? K 4.0 07/20/2021  ? CL 106 07/20/2021  ? CO2 29 07/20/2021  ? ? ?RADIOGRAPHIC STUDIES: ?I have personally reviewed the radiological reports and agreed with the findings in the report. ? ?ASSESSMENT AND PLAN:  ?Malignant neoplasm of upper-inner quadrant of left breast in female, estrogen receptor positive ([place]) ?Screening mammogram detected mass lower medial quadrant, 2 adjacent masses 1 cm and 0.9 cm (total 1.9 cm) at 9 o'clock position: Biopsy: Grade 2-3 IDC with DCIS, ER 50%, PR 50%, HER2 equivocal, FISH pending, Ki-67 1% ? ?Pathology and radiology counseling:Discussed with the patient, the details of pathology including the type of breast cancer,the clinical staging, the significance of ER, PR and HER-2/neu receptors and the implications for treatment. After reviewing the pathology in detail, we proceeded to discuss the different treatment options between surgery, radiation, chemotherapy, antiestrogen therapies. ? ?Recommendations: ?1. Breast conserving surgery followed by ?2. Adjuvant radiation therapy followed by ?3. Adjuvant antiestrogen therapy ? ?Return to clinic after surgery to discuss final pathology report  ? ?All questions were  answered. The patient knows to call the clinic with any problems, questions or concerns. ?  ?Rulon Eisenmenger, MD, MPH ?07/20/2021  ? ? I, Thana Ates, am acting as scribe for Nicholas Lose, MD. ? ?I have reviewed the above documentation for accuracy and completeness, and I agree with the above. ? ? ? ? ?

## 2021-07-20 ENCOUNTER — Encounter: Payer: Self-pay | Admitting: *Deleted

## 2021-07-20 ENCOUNTER — Ambulatory Visit
Admission: RE | Admit: 2021-07-20 | Discharge: 2021-07-20 | Disposition: A | Discharge status: Home / Self Care | Payer: Federal, State, Local not specified - PPO | Source: Ambulatory Visit | Attending: Radiation Oncology | Admitting: Radiation Oncology

## 2021-07-20 ENCOUNTER — Inpatient Hospital Stay (HOSPITAL_BASED_OUTPATIENT_CLINIC_OR_DEPARTMENT_OTHER): Payer: Federal, State, Local not specified - PPO | Admitting: Hematology and Oncology

## 2021-07-20 ENCOUNTER — Other Ambulatory Visit: Payer: Self-pay

## 2021-07-20 ENCOUNTER — Inpatient Hospital Stay: Payer: Federal, State, Local not specified - PPO | Attending: Hematology and Oncology

## 2021-07-20 ENCOUNTER — Ambulatory Visit (HOSPITAL_BASED_OUTPATIENT_CLINIC_OR_DEPARTMENT_OTHER): Payer: Federal, State, Local not specified - PPO | Admitting: Genetic Counselor

## 2021-07-20 ENCOUNTER — Encounter: Payer: Self-pay | Admitting: General Practice

## 2021-07-20 ENCOUNTER — Ambulatory Visit: Payer: Self-pay | Admitting: General Surgery

## 2021-07-20 DIAGNOSIS — C50212 Malignant neoplasm of upper-inner quadrant of left female breast: Secondary | ICD-10-CM

## 2021-07-20 DIAGNOSIS — Z170 Estrogen receptor positive status [ER+]: Secondary | ICD-10-CM | POA: Diagnosis present

## 2021-07-20 DIAGNOSIS — Z803 Family history of malignant neoplasm of breast: Secondary | ICD-10-CM

## 2021-07-20 LAB — CBC WITH DIFFERENTIAL (CANCER CENTER ONLY)
Abs Immature Granulocytes: 0.01 K/uL (ref 0.00–0.07)
Basophils Absolute: 0.1 K/uL (ref 0.0–0.1)
Basophils Relative: 1 %
Eosinophils Absolute: 0.1 K/uL (ref 0.0–0.5)
Eosinophils Relative: 2 %
HCT: 37.4 % (ref 36.0–46.0)
Hemoglobin: 12.1 g/dL (ref 12.0–15.0)
Immature Granulocytes: 0 %
Lymphocytes Relative: 22 %
Lymphs Abs: 1.3 K/uL (ref 0.7–4.0)
MCH: 30.4 pg (ref 26.0–34.0)
MCHC: 32.4 g/dL (ref 30.0–36.0)
MCV: 94 fL (ref 80.0–100.0)
Monocytes Absolute: 0.6 K/uL (ref 0.1–1.0)
Monocytes Relative: 10 %
Neutro Abs: 3.9 K/uL (ref 1.7–7.7)
Neutrophils Relative %: 65 %
Platelet Count: 226 K/uL (ref 150–400)
RBC: 3.98 MIL/uL (ref 3.87–5.11)
RDW: 14.4 % (ref 11.5–15.5)
WBC Count: 6.1 K/uL (ref 4.0–10.5)
nRBC: 0 % (ref 0.0–0.2)

## 2021-07-20 LAB — CMP (CANCER CENTER ONLY)
ALT: 18 U/L (ref 0–44)
AST: 24 U/L (ref 15–41)
Albumin: 4 g/dL (ref 3.5–5.0)
Alkaline Phosphatase: 79 U/L (ref 38–126)
Anion gap: 7 (ref 5–15)
BUN: 14 mg/dL (ref 8–23)
CO2: 29 mmol/L (ref 22–32)
Calcium: 9.3 mg/dL (ref 8.9–10.3)
Chloride: 106 mmol/L (ref 98–111)
Creatinine: 0.82 mg/dL (ref 0.44–1.00)
GFR, Estimated: >60 mL/min
Glucose, Bld: 96 mg/dL (ref 70–99)
Potassium: 4 mmol/L (ref 3.5–5.1)
Sodium: 142 mmol/L (ref 135–145)
Total Bilirubin: 0.5 mg/dL (ref 0.3–1.2)
Total Protein: 6.8 g/dL (ref 6.5–8.1)

## 2021-07-20 LAB — GENETIC SCREENING ORDER

## 2021-07-20 NOTE — Progress Notes (Signed)
?Radiation Oncology         [phone] ?________________________________ ? ?Name: Tara Hardin        MRN: [id number]  ?Date of Service: 07/20/2021 DOB: March 05, 1941 ? ?AS:NKNLZ, Hal Hope, MD  Jovita Kussmaul, MD    ? ?REFERRING PHYSICIAN: Jovita Kussmaul, MD ? ? ?DIAGNOSIS: The encounter diagnosis was Malignant neoplasm of upper-inner quadrant of left breast in female, estrogen receptor positive ([place]). ? ? ?HISTORY OF PRESENT ILLNESS: Tara Hardin is a 81 y.o. female seen in the multidisciplinary breast clinic for a new diagnosis of left breast cancer. The patient was noted to have screening detected mass in the 9:00 position by diagnostic imaging that measured 1 cm. Her axilla was not scanned and she had a 5 mm satellite lesion. A biopsy showed a grade 2-3 invasive ductal carcinoma with associated DCIS that was ER/PR positive, HER2 is equivocal and pending FISH and Ki 67 was 1%. She's seen today to discuss treatment recommendations of her cancer.  ? ? ? ?PREVIOUS RADIATION THERAPY: No ? ? ?PAST MEDICAL HISTORY:  ?Past Medical History:  ?Diagnosis Date  ? 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 Cardiac murmur, unspecified 08/30/2020  ? Chill 08/30/2020  ? Dyslipidemia 07/21/2020  ? Dyspnea on exertion 07/21/2020  ? Hematochezia 08/30/2020  ? High cholesterol   ? 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 Prediabetes 08/30/2020  ?   ? ? ?PAST SURGICAL HISTORY: ?Past Surgical History:  ?Procedure Laterality Date  ? NO PAST SURGERIES    ? ? ? ?FAMILY HISTORY:  ?Family History  ?Problem Relation Age of Onset  ? Heart attack Sister   ? Atrial fibrillation Brother   ? Hypertension Brother   ? Heart attack Brother   ? Irregular heart beat Brother   ? Hypertension Sister   ? ? ? ?SOCIAL HISTORY:  reports that she has never smoked. She has never used smokeless tobacco. ? ? ?ALLERGIES: Patient has no known allergies. ? ? ?MEDICATIONS:  ?Current Outpatient Medications   ?Medication Sig Dispense Refill  ? aspirin 81 MG tablet Take 81 mg by mouth as needed.    ? fluticasone (FLONASE) 50 MCG/ACT nasal spray Place 2 sprays into both nostrils as needed.    ? Multiple Vitamin (MULTIVITAMIN) tablet Take 1 tablet by mouth daily.    ? Omega-3 Fatty Acids (FISH OIL) 1000 MG CAPS Take by mouth.    ? Progesterone Micronized (PROGESTERONE PO) Take by mouth.    ? Red Yeast Rice Extract (RED YEAST RICE PO) Take by mouth.    ? triamcinolone ointment (KENALOG) 0.1 % Apply 1 application topically 2 (two) times daily.    ? ?No current facility-administered medications for this encounter.  ? ? ? ?REVIEW OF SYSTEMS: On review of systems, the patient reports that she is doing well overall. She's had some soreness of her breast since her biopsy. No other complaints are noted.  ? ?  ? ?PHYSICAL EXAM:  ?Wt Readings from Last 3 Encounters:  ?06/01/21 118 lb 3.2 oz (53.6 kg)  ?12/09/20 120 lb (54.4 kg)  ?07/21/20 119 lb (54 kg)  ? ?Temp Readings from Last 3 Encounters:  ?No data found for Temp  ? ?BP Readings from Last 3 Encounters:  ?06/01/21 (!) 142/72  ?12/09/20 136/70  ?07/21/20 106/78  ? ?Pulse Readings from Last 3 Encounters:  ?06/01/21 89  ?12/09/20 81  ?07/21/20 82  ? ? ?In general this is an elderly appearing  caucasian female in no acute distress. She's alert and oriented x4 and appropriate throughout the examination. Cardiopulmonary assessment is negative for acute distress and she exhibits normal effort. Bilateral breast exam is deferred. ? ? ? ?ECOG = 1 ? ?0 - Asymptomatic (Fully active, able to carry on all predisease activities without restriction) ? ?1 - Symptomatic but completely ambulatory (Restricted in physically strenuous activity but ambulatory and able to carry out work of a light or sedentary nature. For example, light housework, office work) ? ?2 - Symptomatic, <50% in bed during the day (Ambulatory and capable of all self care but unable to carry out any work activities. Up and  about more than 50% of waking hours) ? ?3 - Symptomatic, >50% in bed, but not bedbound (Capable of only limited self-care, confined to bed or chair 50% or more of waking hours) ? ?4 - Bedbound (Completely disabled. Cannot carry on any self-care. Totally confined to bed or chair) ? ?5 - Death ? ? Oken MM, Creech RH, Tormey DC, et al. [phone]). "Toxicity and response criteria of the [facility]". [place] Oncol. 5 (6): 649-55 ? ? ? ?LABORATORY DATA:  ?No results found for: WBC, HGB, HCT, MCV, PLT ?No results found for: NA, K, CL, CO2 ?No results found for: ALT, AST, GGT, ALKPHOS, BILITOT ?  ? ?RADIOGRAPHY: No results found. ?   ? ?IMPRESSION/PLAN: ?1. 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 Dr. Lisbeth Renshaw discusses the pathology findings and reviews the nature of early stage left breast disease. The consensus from the breast conference includes axillary ultrasound to rule out nodal disease followed by breast conservation with lumpectomy. Dr. Lisbeth Renshaw discusses the rationale for external radiotherapy to the breast  to reduce risks of local recurrence followed by antiestrogen therapy. Dr. Lisbeth Renshaw also discusses cases in which radiation may be optional for favorable cases based on final pathology. We discussed the risks, benefits, short, and long term effects of radiotherapy, as well as the curative intent if she proceeds. Dr. Lisbeth Renshaw discusses the delivery and logistics of radiotherapy and anticipates a course of 4  weeks of radiotherapy to the left breast with deep inspiration breath hold technique. We will see her back a few weeks after surgery to discuss the simulation process and anticipate we starting radiotherapy about 4-6 weeks after surgery.  ?2. Possible genetic predisposition to malignancy. The patient is a candidate for genetic testing given her personal  history. She was offered referral and met with genetic testing today but she has decided to  forgo this.  ? ?In a visit lasting 60 minutes, greater than 50% of the time was spent face to face reviewing her case, as well as in preparation of, discussing, and coordinating the patient's care. ? ?The above documentation reflects my direct findings during this shared patient visit. Please see the separate note by Dr. Lisbeth Renshaw on this date for the remainder of the patient's plan of care. ? ? ? ?Carola Rhine, PAC ? ? ? ?**Disclaimer: This note was dictated with voice recognition software. Similar sounding words can inadvertently be transcribed and this note may contain transcription errors which may not have been corrected upon publication of note.** ?

## 2021-07-20 NOTE — Assessment & Plan Note (Signed)
Screening mammogram detected mass lower medial quadrant, 2 adjacent masses 1 cm and 0.9 cm (total 1.9 cm) at 9 o'clock position: Biopsy: Grade 2-3 IDC with DCIS, ER 50%, PR 50%, HER2 equivocal, FISH pending, Ki-67 1% ? ?Pathology and radiology counseling:Discussed with the patient, the details of pathology including the type of breast cancer,the clinical staging, the significance of ER, PR and HER-2/neu receptors and the implications for treatment. After reviewing the pathology in detail, we proceeded to discuss the different treatment options between surgery, radiation, chemotherapy, antiestrogen therapies. ? ?Recommendations: ?1. Breast conserving surgery followed by ?2. Adjuvant radiation therapy followed by ?3. Adjuvant antiestrogen therapy ? ?Return to clinic after surgery to discuss final pathology report and then determine if Oncotype DX testing will need to be sent. ? ? ?

## 2021-07-20 NOTE — Progress Notes (Signed)
[facility] Psychosocial Distress Screening ?Spiritual Care ? ?Met with Tara Hardin and her daughter in [facility] to introduce [facility] team/resources, reviewing distress screen per protocol.  The patient scored a 7 on the Psychosocial Distress Thermometer which indicates severe distress. Also assessed for distress and other psychosocial needs.  ? ?ONCBCN DISTRESS SCREENING 07/20/2021  ?Screening Type Initial Screening  ?Distress experienced in past week (1-10) 7  ?Family Problem type Other (comment)  ?Emotional problem type Nervousness/Anxiety  ?Information Concerns Type Lack of info about complementary therapy choices;Lack of info about treatment  ?Physical Problem type Sleep/insomnia  ?Referral to support programs Yes  ? ?[name] notes that attending [facility] and meeting her treatment team helped reduce her distress to a 1.  ? ?Provided empathic listening, normalization of feelings, and introduction to [facility] team and programming. ? ?[name] is very busy with caregiving for her husband. She reports good support, including several prayer chains, and enjoys conservative [employer] radio programming for encouragement and uplifting messages. She also prefers calls after 3:30 pm due to her caregiving and sleep schedule. ? ?Follow up needed: Yes.  [name] welcomes an Pharmacist, hospital referral and a follow-up chaplain call in ca 2 weeks. ? ? ?Chaplain Lorrin Jackson, MDiv, [facility] ?Pager [phone] ?Voicemail [phone] ? ? ? ? ?  ?

## 2021-07-20 NOTE — Research (Signed)
[employer] 2021-05 - Specimen Collection Study to Evaluate Biomarkers in Subjects with Cancer   ? ?Patient Tara Hardin was identified by Dr. Lindi Adie as a potential candidate for the above listed study.  This Clinical Research Nurse met with Molli Posey, [id number], on 07/20/21 in a manner and location that ensures patient privacy to discuss participation in the above listed research study.  Patient is Accompanied by her daughter .  A copy of the informed consent document with embedded HIPAA language was offered to the patient.  Patient reads, speaks, and understands [place].   ?Patient was offered the business card of this Nurse and encouraged to contact the research team with any questions.  Approximately 15 minutes was spent with the patient reviewing the informed consent documents.  Patient was provided the option of taking informed consent documents home to review.  The pt decided that she was not interested in the study.  The pt was thanked for her time.   ?Brion Aliment RN, BSN, CCRP ?Clinical Research Nurse Lead ?07/20/2021 4:52 PM   ?

## 2021-07-21 ENCOUNTER — Encounter: Payer: Self-pay | Admitting: General Practice

## 2021-07-21 ENCOUNTER — Encounter: Payer: Self-pay | Admitting: Genetic Counselor

## 2021-07-21 DIAGNOSIS — Z803 Family history of malignant neoplasm of breast: Secondary | ICD-10-CM | POA: Insufficient documentation

## 2021-07-21 HISTORY — DX: Family history of malignant neoplasm of breast: Z80.3

## 2021-07-21 NOTE — Progress Notes (Signed)
[facility] Spiritual Care Note ? ?Placed [facility] peer mentor referral per Ms Trochez's request. Will follow up by phone in ca two weeks as we planned together. ? ? ?Chaplain Lorrin Jackson, MDiv, [facility] ?Pager [phone] ?Voicemail [phone]  ?

## 2021-07-21 NOTE — Progress Notes (Signed)
REFERRING PROVIDER: ?Nicholas Lose, MD ?Lead ?[place],  [place] [address] ? ?PRIMARY PROVIDER:  ?Carol Ada, MD ? ?PRIMARY REASON FOR VISIT:  ?1. Malignant neoplasm of upper-inner quadrant of left breast in female, estrogen receptor positive ([place])   ?2. Family history of breast cancer   ? ? ?HISTORY OF PRESENT ILLNESS:   ?Tara Hardin, a 81 y.o. female, was seen for a [facility] cancer genetics consultation at the request of Dr. Lindi Adie due to a personal and family history of breast cancer.  Tara Hardin presents to clinic today to discuss the possibility of a hereditary predisposition to cancer, to discuss genetic testing, and to further clarify her future cancer risks, as well as potential cancer risks for family members.  ? ?In March 2023, at the age of 35, Tara Hardin was diagnosed with invasive ductal carcinoma of the left breast (ER+/PR+/HER2-). The preliminary treatment plan includes breast conserving surgery, adjuvant radiation, and adjuvant anti-estrogens.  ? ?CANCER HISTORY:  ?Oncology History  ?Malignant neoplasm of upper-inner quadrant of left breast in female, estrogen receptor positive ([place])  ?07/18/2021 Initial Diagnosis  ? Screening mammogram detected mass lower medial quadrant, 2 adjacent masses 1 cm and 0.9 cm (total 1.9 cm) at 9 o'clock position: Biopsy: Grade 2-3 IDC with DCIS, ER 50%, PR 50%, HER2 equivocal, FISH pending, Ki-67 1% ?  ?07/20/2021 Cancer Staging  ? Staging form: Breast, AJCC 8th Edition ?- Clinical stage from 07/20/2021: Stage IA (cT1b, cN0, cM0, G3, ER+, PR+, HER2-) - Signed by Hayden Pedro, PA-C on 07/20/2021 ?Stage prefix: Initial diagnosis ?Method of lymph node assessment: Clinical ?Histologic grading system: 3 grade system ? ?  ? ? ?RISK FACTORS:  ?Mammogram within the last year: yes ?Colonoscopy: yes; most recent approximately 2-3 years ago ?Menarche was at age 75.  ?First live birth at age 43.  ?Menopausal status: postmenopausal.  ?OCP use for  approximately 0 years.  ?HRT use: 6 years; stopped 20-25 years ago ? ?Past Medical History:  ?Diagnosis Date  ? 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 Cardiac murmur, unspecified 08/30/2020  ? Chill 08/30/2020  ? Dyslipidemia 07/21/2020  ? Dyspnea on exertion 07/21/2020  ? Family history of breast cancer 07/21/2021  ? Hematochezia 08/30/2020  ? High cholesterol   ? 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 Prediabetes 08/30/2020  ? ? ?Past Surgical History:  ?Procedure Laterality Date  ? NO PAST SURGERIES    ? ? ?FAMILY HISTORY:  ?We obtained a detailed, 4-generation family history.  Significant diagnoses are listed below: ?Family History  ?Problem Relation Age of Onset  ? Lung cancer Mother   ?     d. 87  ? Breast cancer Paternal Aunt   ?     dx after 35  ? Breast cancer Cousin   ?     paternal female cousin  ? ? ?[name] is unaware of previous family history of genetic testing for hereditary cancer risks. There is no reported Ashkenazi Jewish ancestry. There is no known consanguinity. ? ?GENETIC COUNSELING ASSESSMENT: Tara Hardin is a 81 y.o. female with a personal and family history of cancer which is somewhat suggestive of a hereditary cancer syndrome and predisposition to cancer given the presence of related cancers in the family. We, therefore, discussed and recommended the following at today's visit.  ? ?DISCUSSION: We discussed that 5 - 10% of cancer is hereditary.  Most cases of hereditary breast cancer are associated with  mutations in BRCA1/2.  There are other genes that can be associated with hereditary breast cancer syndromes.  We discussed that testing is beneficial for several reasons including knowing how to follow individuals for their cancer risks, identifying whether potential treatment options would be beneficial, and understanding if other family members could be at risk for cancer and allowing them to undergo genetic testing.  ? ?We  reviewed the characteristics, features and inheritance patterns of hereditary cancer syndromes. We also discussed genetic testing, including the appropriate family members to test, the process of testing, insurance coverage and turn-around-time for results. We discussed the implications of a negative, positive, carrier and/or variant of uncertain significant result. We recommended Tara Hardin pursue genetic testing for a panel that includes genes associated with breast cancer.  ? ?Based on Tara Hardin's personal and family history of cancer, she meets medical criteria for genetic testing. Despite that she meets criteria, she may still have an out of pocket cost.  ? ? ?PLAN: [name] did not wish to pursue genetic testing at today's visit. We understand this decision and remain available to coordinate genetic testing at any time in the future. We, therefore, recommend [name] continue to follow the cancer screening guidelines given by her primary healthcare provider. ? ?Lastly, we encouraged Tara Hardin to remain in contact with cancer genetics annually so that we can continuously update the family history and inform her of any changes in cancer genetics and testing that may be of benefit for this family.  ? ?Tara Hardin's questions were answered to her satisfaction today. Our contact information was provided should additional questions or concerns arise. Thank you for the referral and allowing [place] to share in the care of your patient.  ? ?Phil Corti M. Joette Catching, [place], [place] ?Genetic Counselor ?[email or web address] ?(P) [phone] ? ?The patient was seen for a total of 15 minutes in face-to-face genetic counseling.  The patient was accompanied by her daughter, Tara Hardin.  Drs. Lindi Adie and/or Burr Medico were available to discuss this case as needed.  ? ? ?_______________________________________________________________________ ?For Office Staff:  ?Number of people involved in session: 2 ?Was an Intern/ student involved  with case: no ? ?

## 2021-07-28 ENCOUNTER — Telehealth: Payer: Self-pay | Admitting: Radiation Oncology

## 2021-07-28 ENCOUNTER — Encounter: Payer: Self-pay | Admitting: *Deleted

## 2021-07-28 ENCOUNTER — Telehealth: Payer: Self-pay | Admitting: *Deleted

## 2021-07-28 NOTE — Telephone Encounter (Signed)
Left message for a return phone call to follow from [facility] 3/8 and assess navigation needs. ?

## 2021-07-28 NOTE — Telephone Encounter (Signed)
3/16 @ 10:29 am Left voicemail for patient to call our office. ?

## 2021-07-29 ENCOUNTER — Telehealth: Payer: Self-pay | Admitting: Radiation Oncology

## 2021-07-29 NOTE — Telephone Encounter (Signed)
3/17 @ 8:15 am Left voicemail for patient to call the office to confirm appointment date/time.   ?

## 2021-08-02 ENCOUNTER — Encounter (HOSPITAL_BASED_OUTPATIENT_CLINIC_OR_DEPARTMENT_OTHER): Payer: Self-pay | Admitting: General Surgery

## 2021-08-02 ENCOUNTER — Other Ambulatory Visit: Payer: Self-pay

## 2021-08-03 ENCOUNTER — Ambulatory Visit
Admission: RE | Admit: 2021-08-03 | Discharge: 2021-08-03 | Disposition: A | Discharge status: Home / Self Care | Payer: Self-pay | Source: Ambulatory Visit | Attending: Radiation Oncology | Admitting: Radiation Oncology

## 2021-08-03 ENCOUNTER — Inpatient Hospital Stay
Admission: RE | Admit: 2021-08-03 | Discharge: 2021-08-03 | Disposition: A | Discharge status: Home / Self Care | Payer: Self-pay | Source: Ambulatory Visit | Attending: Radiation Oncology | Admitting: Radiation Oncology

## 2021-08-03 ENCOUNTER — Other Ambulatory Visit: Payer: Self-pay | Admitting: Radiation Oncology

## 2021-08-03 DIAGNOSIS — C50212 Malignant neoplasm of upper-inner quadrant of left female breast: Secondary | ICD-10-CM

## 2021-08-04 ENCOUNTER — Telehealth: Payer: Self-pay | Admitting: Hematology and Oncology

## 2021-08-04 NOTE — Telephone Encounter (Signed)
.  Called pt per 3/20 inbasket , Patient was unavailable, a message with appt time and date was left with number on file.   ?

## 2021-08-04 NOTE — Telephone Encounter (Signed)
Cancelled post op appt her 3/23 pt request, urged pt to keep appt and explained it is a follow up from surgery, but pt did not want appt and did not want to r/s at this time. Told pt to call back if she wanted to r/s ?

## 2021-08-08 ENCOUNTER — Encounter: Payer: Self-pay | Admitting: *Deleted

## 2021-08-11 ENCOUNTER — Encounter (HOSPITAL_BASED_OUTPATIENT_CLINIC_OR_DEPARTMENT_OTHER): Payer: Self-pay | Admitting: General Surgery

## 2021-08-11 ENCOUNTER — Ambulatory Visit (HOSPITAL_BASED_OUTPATIENT_CLINIC_OR_DEPARTMENT_OTHER)
Admission: RE | Admit: 2021-08-11 | Discharge: 2021-08-11 | Disposition: A | Discharge status: Home / Self Care | Payer: Federal, State, Local not specified - PPO | Attending: General Surgery | Admitting: General Surgery

## 2021-08-11 ENCOUNTER — Other Ambulatory Visit: Payer: Self-pay

## 2021-08-11 ENCOUNTER — Ambulatory Visit (HOSPITAL_BASED_OUTPATIENT_CLINIC_OR_DEPARTMENT_OTHER): Payer: Federal, State, Local not specified - PPO | Admitting: Anesthesiology

## 2021-08-11 ENCOUNTER — Encounter (HOSPITAL_BASED_OUTPATIENT_CLINIC_OR_DEPARTMENT_OTHER)
Admission: RE | Disposition: A | Discharge status: Home / Self Care | Payer: Self-pay | Source: Home / Self Care | Attending: General Surgery

## 2021-08-11 DIAGNOSIS — I352 Nonrheumatic aortic (valve) stenosis with insufficiency: Secondary | ICD-10-CM | POA: Insufficient documentation

## 2021-08-11 DIAGNOSIS — C50312 Malignant neoplasm of lower-inner quadrant of left female breast: Secondary | ICD-10-CM | POA: Diagnosis not present

## 2021-08-11 DIAGNOSIS — Z170 Estrogen receptor positive status [ER+]: Secondary | ICD-10-CM | POA: Diagnosis not present

## 2021-08-11 HISTORY — PX: BREAST LUMPECTOMY WITH RADIOACTIVE SEED LOCALIZATION: SHX6424

## 2021-08-11 HISTORY — DX: Nonrheumatic aortic (valve) stenosis: I35.0

## 2021-08-11 SURGERY — BREAST LUMPECTOMY WITH RADIOACTIVE SEED LOCALIZATION
Anesthesia: General | Site: Breast | Laterality: Left

## 2021-08-11 MED ORDER — ONDANSETRON HCL 4 MG/2ML IJ SOLN
INTRAMUSCULAR | Status: AC
  Filled 2021-08-11: qty 2

## 2021-08-11 MED ORDER — PROPOFOL 10 MG/ML IV BOLUS
INTRAVENOUS | Status: DC | PRN
  Administered 2021-08-11: 80 mg via INTRAVENOUS

## 2021-08-11 MED ORDER — CEFAZOLIN SODIUM-DEXTROSE 2-4 GM/100ML-% IV SOLN
2.0000 g | INTRAVENOUS | Status: AC
  Administered 2021-08-11: 2 g via INTRAVENOUS

## 2021-08-11 MED ORDER — PHENYLEPHRINE HCL (PRESSORS) 10 MG/ML IV SOLN
INTRAVENOUS | Status: DC | PRN
  Administered 2021-08-11: 80 ug via INTRAVENOUS

## 2021-08-11 MED ORDER — FENTANYL CITRATE (PF) 100 MCG/2ML IJ SOLN
INTRAMUSCULAR | Status: DC | PRN
Start: 2021-08-11 — End: 2021-08-11
  Administered 2021-08-11 (×2): 25 ug via INTRAVENOUS
  Administered 2021-08-11: 50 ug via INTRAVENOUS

## 2021-08-11 MED ORDER — BUPIVACAINE-EPINEPHRINE (PF) 0.25% -1:200000 IJ SOLN
INTRAMUSCULAR | Status: DC | PRN
  Administered 2021-08-11: 17 mL

## 2021-08-11 MED ORDER — DEXAMETHASONE SODIUM PHOSPHATE 4 MG/ML IJ SOLN
INTRAMUSCULAR | Status: DC | PRN
  Administered 2021-08-11: 4 mg via INTRAVENOUS

## 2021-08-11 MED ORDER — EPHEDRINE SULFATE (PRESSORS) 50 MG/ML IJ SOLN
INTRAMUSCULAR | Status: DC | PRN
  Administered 2021-08-11 (×2): 10 mg via INTRAVENOUS

## 2021-08-11 MED ORDER — FENTANYL CITRATE (PF) 100 MCG/2ML IJ SOLN
INTRAMUSCULAR | Status: AC
Start: 2021-08-11 — End: ?
  Filled 2021-08-11: qty 2

## 2021-08-11 MED ORDER — PROPOFOL 10 MG/ML IV BOLUS
INTRAVENOUS | Status: AC
  Filled 2021-08-11: qty 20

## 2021-08-11 MED ORDER — OXYCODONE HCL 5 MG PO TABS: 5.0000 mg | ORAL_TABLET | Freq: Once | ORAL | Status: DC | PRN

## 2021-08-11 MED ORDER — GABAPENTIN 300 MG PO CAPS
ORAL_CAPSULE | ORAL | Status: AC
  Filled 2021-08-11: qty 1

## 2021-08-11 MED ORDER — ONDANSETRON HCL 4 MG/2ML IJ SOLN: 4.0000 mg | Freq: Four times a day (QID) | INTRAMUSCULAR | Status: DC | PRN

## 2021-08-11 MED ORDER — DEXAMETHASONE SODIUM PHOSPHATE 10 MG/ML IJ SOLN
INTRAMUSCULAR | Status: AC
  Filled 2021-08-11: qty 1

## 2021-08-11 MED ORDER — GABAPENTIN 300 MG PO CAPS: 300.0000 mg | ORAL_CAPSULE | ORAL | Status: DC

## 2021-08-11 MED ORDER — CHLORHEXIDINE GLUCONATE CLOTH 2 % EX PADS: 6.0000 | MEDICATED_PAD | Freq: Once | CUTANEOUS | Status: DC

## 2021-08-11 MED ORDER — 0.9 % SODIUM CHLORIDE (POUR BTL) OPTIME
TOPICAL | Status: DC | PRN
  Administered 2021-08-11: 1000 mL

## 2021-08-11 MED ORDER — LIDOCAINE 2% (20 MG/ML) 5 ML SYRINGE
INTRAMUSCULAR | Status: AC
  Filled 2021-08-11: qty 5

## 2021-08-11 MED ORDER — OXYCODONE HCL 5 MG/5ML PO SOLN: 5.0000 mg | Freq: Once | ORAL | Status: DC | PRN

## 2021-08-11 MED ORDER — OXYCODONE HCL 5 MG PO TABS: 5.0000 mg | ORAL_TABLET | Freq: Four times a day (QID) | ORAL | 0 refills | Status: DC | PRN

## 2021-08-11 MED ORDER — FENTANYL CITRATE (PF) 100 MCG/2ML IJ SOLN: 25.0000 ug | INTRAMUSCULAR | Status: DC | PRN

## 2021-08-11 MED ORDER — CELECOXIB 200 MG PO CAPS
200.0000 mg | ORAL_CAPSULE | ORAL | Status: AC
  Administered 2021-08-11: 200 mg via ORAL

## 2021-08-11 MED ORDER — ACETAMINOPHEN 500 MG PO TABS
ORAL_TABLET | ORAL | Status: AC
  Filled 2021-08-11: qty 2

## 2021-08-11 MED ORDER — LACTATED RINGERS IV SOLN: INTRAVENOUS | Status: DC

## 2021-08-11 MED ORDER — ACETAMINOPHEN 500 MG PO TABS
1000.0000 mg | ORAL_TABLET | ORAL | Status: AC
  Administered 2021-08-11: 1000 mg via ORAL

## 2021-08-11 MED ORDER — CELECOXIB 200 MG PO CAPS
ORAL_CAPSULE | ORAL | Status: AC
  Filled 2021-08-11: qty 1

## 2021-08-11 MED ORDER — CEFAZOLIN SODIUM-DEXTROSE 2-4 GM/100ML-% IV SOLN
INTRAVENOUS | Status: AC
  Filled 2021-08-11: qty 100

## 2021-08-11 MED ORDER — LIDOCAINE HCL (CARDIAC) PF 100 MG/5ML IV SOSY
PREFILLED_SYRINGE | INTRAVENOUS | Status: DC | PRN
Start: 2021-08-11 — End: 2021-08-11
  Administered 2021-08-11: 40 mg via INTRAVENOUS

## 2021-08-11 MED ORDER — BUPIVACAINE-EPINEPHRINE (PF) 0.25% -1:200000 IJ SOLN
INTRAMUSCULAR | Status: AC
  Filled 2021-08-11: qty 60

## 2021-08-11 SURGICAL SUPPLY — 44 items
ADH SKN CLS APL DERMABOND .7 (GAUZE/BANDAGES/DRESSINGS) ×1
APL PRP STRL LF DISP 70% ISPRP (MISCELLANEOUS) ×1
APPLIER CLIP 9.375 MED OPEN (MISCELLANEOUS)
APR CLP MED 9.3 20 MLT OPN (MISCELLANEOUS)
BINDER BREAST MEDIUM (GAUZE/BANDAGES/DRESSINGS) ×1 IMPLANT
BLADE SURG 15 STRL LF DISP TIS (BLADE) ×1 IMPLANT
BLADE SURG 15 STRL SS (BLADE) ×2
CANISTER SUC SOCK COL 7IN (MISCELLANEOUS) ×1 IMPLANT
CANISTER SUCT 1200ML W/VALVE (MISCELLANEOUS) ×2 IMPLANT
CHLORAPREP W/TINT 26 (MISCELLANEOUS) ×2 IMPLANT
CLIP APPLIE 9.375 MED OPEN (MISCELLANEOUS) IMPLANT
COVER BACK TABLE 60X90IN (DRAPES) ×2 IMPLANT
COVER MAYO STAND STRL (DRAPES) ×2 IMPLANT
COVER PROBE W GEL 5X96 (DRAPES) ×2 IMPLANT
DERMABOND ADVANCED (GAUZE/BANDAGES/DRESSINGS) ×1
DERMABOND ADVANCED .7 DNX12 (GAUZE/BANDAGES/DRESSINGS) ×1 IMPLANT
DRAPE LAPAROSCOPIC ABDOMINAL (DRAPES) ×2 IMPLANT
DRAPE UTILITY XL STRL (DRAPES) ×2 IMPLANT
ELECT COATED BLADE 2.86 ST (ELECTRODE) ×2 IMPLANT
ELECT REM PT RETURN 9FT ADLT (ELECTROSURGICAL) ×2
ELECTRODE REM PT RTRN 9FT ADLT (ELECTROSURGICAL) ×1 IMPLANT
GAUZE SPONGE 4X4 12PLY STRL (GAUZE/BANDAGES/DRESSINGS) ×1 IMPLANT
GLOVE SURG ENC MOIS LTX SZ7.5 (GLOVE) ×4 IMPLANT
GOWN STRL REUS W/ TWL LRG LVL3 (GOWN DISPOSABLE) ×2 IMPLANT
GOWN STRL REUS W/TWL LRG LVL3 (GOWN DISPOSABLE) ×6
ILLUMINATOR WAVEGUIDE N/F (MISCELLANEOUS) IMPLANT
KIT MARKER MARGIN INK (KITS) ×2 IMPLANT
LIGHT WAVEGUIDE WIDE FLAT (MISCELLANEOUS) IMPLANT
NDL HYPO 25X1 1.5 SAFETY (NEEDLE) IMPLANT
NEEDLE HYPO 25X1 1.5 SAFETY (NEEDLE) ×2 IMPLANT
NS IRRIG 1000ML POUR BTL (IV SOLUTION) ×1 IMPLANT
PACK BASIN DAY SURGERY FS (CUSTOM PROCEDURE TRAY) ×2 IMPLANT
PENCIL SMOKE EVACUATOR (MISCELLANEOUS) ×2 IMPLANT
SLEEVE SCD COMPRESS KNEE MED (STOCKING) ×2 IMPLANT
SPIKE FLUID TRANSFER (MISCELLANEOUS) IMPLANT
SPONGE T-LAP 18X18 ~~LOC~~+RFID (SPONGE) ×2 IMPLANT
SUT MON AB 4-0 PC3 18 (SUTURE) ×3 IMPLANT
SUT SILK 2 0 SH (SUTURE) IMPLANT
SUT VICRYL 3-0 CR8 SH (SUTURE) ×2 IMPLANT
SYR CONTROL 10ML LL (SYRINGE) ×1 IMPLANT
TOWEL GREEN STERILE FF (TOWEL DISPOSABLE) ×2 IMPLANT
TRAY FAXITRON CT DISP (TRAY / TRAY PROCEDURE) ×2 IMPLANT
TUBE CONNECTING 20X1/4 (TUBING) ×2 IMPLANT
YANKAUER SUCT BULB TIP NO VENT (SUCTIONS) IMPLANT

## 2021-08-11 NOTE — Op Note (Signed)
08/11/2021 ? ?1:56 PM ? ?PATIENT:  Tara Hardin  81 y.o. female ? ?PRE-OPERATIVE DIAGNOSIS:  LEFT BREAST CANCER ? ?POST-OPERATIVE DIAGNOSIS:  LEFT BREAST CANCER ? ?PROCEDURE:  Procedure(s): ?LEFT BREAST LUMPECTOMY WITH RADIOACTIVE SEED LOCALIZATION X2 (Left) ? ?SURGEON:  Surgeon(s) and Role: ?   Jovita Kussmaul, MD - Primary ? ?PHYSICIAN ASSISTANT:  ? ?ASSISTANTS: Dr. Fonnie Mu  ? ?ANESTHESIA:   local and general ? ?EBL:  minimal  ? ?BLOOD ADMINISTERED:none ? ?DRAINS: none  ? ?LOCAL MEDICATIONS USED:  MARCAINE    ? ?SPECIMEN:  Source of Specimen:  left breast tissue ? ?DISPOSITION OF SPECIMEN:  PATHOLOGY ? ?COUNTS:  YES ? ?TOURNIQUET:  * No tourniquets in log * ? ?DICTATION: .Dragon Dictation ? ?After informed consent was obtained the patient was brought to the operating room and placed in the supine position on the operating table.  After adequate induction of general anesthesia the patient's left breast was prepped with ChloraPrep, allowed to dry, and draped in usual sterile manner.  An appropriate timeout was performed.  Previously 2 I-125 seeds were placed in the lower inner quadrant of the left breast to mark 2 areas of invasive breast cancer that were adjacent to each other.  The neoprobe was set to I-125 in the area of radioactivity was readily identified.  The area around this was infiltrated with quarter percent Marcaine.  A curvilinear incision was made along the inner edge of the areola of the left breast with a 15 blade knife.  The incision was carried through the skin and subcutaneous tissue sharply with the electrocautery.  Dissection was then carried out throughout the lower inner quadrant of the left breast between the breast tissue and the subcutaneous fat and skin.  Once the dissection was well beyond the area of the cancer I then removed a circular portion of breast tissue sharply with the electrocautery around the 2 radioactive seeds while checking the area of radioactivity frequently.  This  dissection was carried all the way to the muscle of the chest wall.  Once the specimen was removed it was oriented with the appropriate paint colors.  A specimen radiograph was obtained that showed the 2 clips and 2 seeds to be near the center of the specimen.  The specimen was then sent to pathology for further evaluation.  Hemostasis was achieved using the Bovie electrocautery.  The mobilized breast tissue was then closed with interrupted 3-0 Vicryl stitches.  The skin was closed with interrupted 4-0 Monocryl subcuticular stitches.  Dermabond dressings were applied.  The patient tolerated the procedure well.  At the end of the case all needle sponge and instrument counts were correct.  The patient was then awakened and taken to recovery in stable condition.I was personally present during the key and critical portions of this procedure and immediately available throughout the entire procedure, as documented in my operative note.  ? ?PLAN OF CARE: Discharge to home after PACU ? ?PATIENT DISPOSITION:  PACU - hemodynamically stable. ?  ?Delay start of Pharmacological VTE agent (>24hrs) due to surgical blood loss or risk of bleeding: not applicable ? ?

## 2021-08-11 NOTE — Anesthesia Postprocedure Evaluation (Signed)
Anesthesia Post Note ? ?Patient: Tara Hardin ? ?Procedure(s) Performed: LEFT BREAST LUMPECTOMY WITH RADIOACTIVE SEED LOCALIZATION X2 (Left: Breast) ? ?  ? ?Patient location during evaluation: PACU ?Anesthesia Type: General ?Level of consciousness: awake and alert ?Pain management: pain level controlled ?Vital Signs Assessment: post-procedure vital signs reviewed and stable ?Respiratory status: spontaneous breathing, nonlabored ventilation, respiratory function stable and patient connected to nasal cannula oxygen ?Cardiovascular status: blood pressure returned to baseline and stable ?Postop Assessment: no apparent nausea or vomiting ?Anesthetic complications: no ? ? ?No notable events documented. ? ?Last Vitals:  ?Vitals:  ? 08/11/21 1445 08/11/21 1520  ?BP: (!) 116/53 120/68  ?Pulse: 85 78  ?Resp: 13 16  ?Temp:  (!) 36.2 ?C  ?SpO2: 94% 93%  ?  ?Last Pain:  ?Vitals:  ? 08/11/21 1520  ?TempSrc:   ?PainSc: 3   ? ? ?  ?  ?  ?  ?  ?  ? ?[place] ? ? ? ? ?

## 2021-08-11 NOTE — Anesthesia Procedure Notes (Signed)
Procedure Name: LMA Insertion ?Date/Time: 08/11/2021 12:40 PM ?Performed by: Maryella Shivers, CRNA ?Pre-anesthesia Checklist: Patient identified, Emergency Drugs available, Suction available and Patient being monitored ?Patient Re-evaluated:Patient Re-evaluated prior to induction ?Oxygen Delivery Method: Circle system utilized ?Preoxygenation: Pre-oxygenation with 100% oxygen ?Induction Type: IV induction ?Ventilation: Mask ventilation without difficulty ?LMA: LMA inserted ?LMA Size: 4.0 ?Number of attempts: 1 ?Airway Equipment and Method: Bite block ?Placement Confirmation: positive ETCO2 ?Tube secured with: Tape ?Dental Injury: Teeth and Oropharynx as per pre-operative assessment  ? ? ? ? ?

## 2021-08-11 NOTE — Discharge Instructions (Signed)
**  You had 1000 mg of Tylenol at 1158 next dose 6 pm ?**No ibuprofen until after 6 pm ? ? ? ?Post Anesthesia Home Care Instructions ? ?Activity: ?Get plenty of rest for the remainder of the day. A responsible individual must stay with you for 24 hours following the procedure.  ?For the next 24 hours, DO NOT: ?-Drive a car ?-Paediatric nurse ?-Drink alcoholic beverages ?-Take any medication unless instructed by your physician ?-Make any legal decisions or sign important papers. ? ?Meals: ?Start with liquid foods such as gelatin or soup. Progress to regular foods as tolerated. Avoid greasy, spicy, heavy foods. If nausea and/or vomiting occur, drink only clear liquids until the nausea and/or vomiting subsides. Call your physician if vomiting continues. ? ?Special Instructions/Symptoms: ?Your throat may feel dry or sore from the anesthesia or the breathing tube placed in your throat during surgery. If this causes discomfort, gargle with warm salt water. The discomfort should disappear within 24 hours. ? ?If you had a scopolamine patch placed behind your ear for the management of post- operative nausea and/or vomiting: ? ?1. The medication in the patch is effective for 72 hours, after which it should be removed.  Wrap patch in a tissue and discard in the trash. Wash hands thoroughly with soap and water. ?2. You may remove the patch earlier than 72 hours if you experience unpleasant side effects which may include dry mouth, dizziness or visual disturbances. ?3. Avoid touching the patch. Wash your hands with soap and water after contact with the patch. ?    ?

## 2021-08-11 NOTE — Transfer of Care (Signed)
Immediate Anesthesia Transfer of Care Note ? ?Patient: Tara Hardin ? ?Procedure(s) Performed: LEFT BREAST LUMPECTOMY WITH RADIOACTIVE SEED LOCALIZATION X2 (Left: Breast) ? ?Patient Location: PACU ? ?Anesthesia Type:General ? ?Level of Consciousness: sedated ? ?Airway & Oxygen Therapy: Patient Spontanous Breathing and Patient connected to face mask oxygen ? ?Post-op Assessment: Report given to RN and Post -op Vital signs reviewed and stable ? ?Post vital signs: Reviewed and stable ? ?Last Vitals:  ?Vitals Value Taken Time  ?BP 123/62 08/11/21 1403  ?Temp    ?Pulse 76 08/11/21 1406  ?Resp 32 08/11/21 1406  ?SpO2 99 % 08/11/21 1406  ?Vitals shown include unvalidated device data. ? ?Last Pain:  ?Vitals:  ? 08/11/21 1154  ?TempSrc: Oral  ?PainSc: 0-No pain  ?   ? ?Patients Stated Pain Goal: 6 (08/11/21 1154) ? ?Complications: No notable events documented. ?

## 2021-08-11 NOTE — H&P (Signed)
?PROVIDER: Landry Corporal, MD ? ?MRN: [id number] ?DOB: [birth year] ?Subjective  ? ?Chief Complaint: No chief complaint on file. ? ? ?History of Present Illness: ?Tara Hardin is a 81 y.o. female who is seen today as an office consultation for evaluation of No chief complaint on file. ?.  ? ?We are asked to see the patient in consultation by Dr. Carol Ada to evaluate her for a new left breast cancer. The patient is a 81 year old white female who recently went for a routine screening mammogram. At that time she was found to have 2 small masses in the lower inner quadrant of the left breast that together measures 1.9 cm. The mass was biopsied and came back as a grade 3 invasive ductal cancer that was ER and PR positive and HER2 negative with a Ki-67 of 1%. She has no family history of breast cancer. She is otherwise in good health. ? ?Review of Systems: ?A complete review of systems was obtained from the patient. I have reviewed this information and discussed as appropriate with the patient. See HPI as well for other ROS. ? ?ROS  ? ?Medical History: ?Past Medical History:  ?Diagnosis Date  ? Arthritis  ? Hyperlipidemia  ? ?Patient Active Problem List  ?Diagnosis  ? Malignant neoplasm of upper-inner quadrant of left breast in female, estrogen receptor positive (CMS-HCC)  ? ?No past surgical history on file.  ? ?No Known Allergies ? ?No current outpatient medications on file prior to visit.  ? ?No current facility-administered medications on file prior to visit.  ? ?Family History  ?Problem Relation Age of Onset  ? Skin cancer Sister  ? ? ?Social History  ? ?Tobacco Use  ?Smoking Status Never  ?Smokeless Tobacco Never  ? ? ?Social History  ? ?Socioeconomic History  ? Marital status: Unknown  ?Tobacco Use  ? Smoking status: Never  ? Smokeless tobacco: Never  ?Vaping Use  ? Vaping Use: Never used  ?Substance and Sexual Activity  ? Alcohol use: Never  ? Drug use: Never  ? ?Objective:  ?There were no vitals filed for  this visit.  ?There is no height or weight on file to calculate BMI. ? ?Physical Exam ?Vitals reviewed.  ?Constitutional:  ?General: She is not in acute distress. ?Appearance: Normal appearance.  ?HENT:  ?Head: Normocephalic and atraumatic.  ?Right Ear: External ear normal.  ?Left Ear: External ear normal.  ?Nose: Nose normal.  ?Mouth/Throat:  ?Mouth: Mucous membranes are moist.  ?Pharynx: Oropharynx is clear.  ?Eyes:  ?General: No scleral icterus. ?Extraocular Movements: Extraocular movements intact.  ?Conjunctiva/sclera: Conjunctivae normal.  ?Pupils: Pupils are equal, round, and reactive to light.  ?Cardiovascular:  ?Rate and Rhythm: Normal rate and regular rhythm.  ?Pulses: Normal pulses.  ?Heart sounds: Murmur heard.  ?Pulmonary:  ?Effort: Pulmonary effort is normal. No respiratory distress.  ?Breath sounds: Normal breath sounds.  ?Abdominal:  ?General: Bowel sounds are normal.  ?Palpations: Abdomen is soft.  ?Tenderness: There is no abdominal tenderness.  ?Musculoskeletal:  ?General: No swelling, tenderness or deformity. Normal range of motion.  ?Cervical back: Normal range of motion and neck supple.  ?Skin: ?General: Skin is warm and dry.  ?Coloration: Skin is not jaundiced.  ?Neurological:  ?General: No focal deficit present.  ?Mental Status: She is alert and oriented to person, place, and time.  ?Psychiatric:  ?Mood and Affect: Mood normal.  ?Behavior: Behavior normal.  ? ? ? ?Breast: There is no palpable mass in either breast. There is no palpable axillary, supraclavicular, or  cervical lymphadenopathy. ? ?Labs, Imaging and Diagnostic Testing: ? ?Assessment and Plan:  ? ?Diagnoses and all orders for this visit: ? ?Malignant neoplasm of upper-inner quadrant of left breast in female, estrogen receptor positive (CMS-HCC) ? ? ? ?The patient appears to have 2 small masses measuring 1.9 cm in the lower inner quadrant of the left breast with clinically negative nodes. I have discussed with her in detail the  different options for treatment and at this point she favors breast conservation which I feel is very reasonable. Given her age and the favorable markers she will not need a node evaluation. I have discussed with her in detail the risks and benefits of the operation as well as some of the technical aspects including the use of a radioactive seed for localization and she understands and wishes to proceed. She will meet with medical and radiation oncology to discuss adjuvant therapy. She does see Dr. Angelena Form in cardiology for a heart murmur but is not on any cardiac medicines so she will not likely need cardiac clearance. ?

## 2021-08-11 NOTE — Anesthesia Preprocedure Evaluation (Signed)
Anesthesia Evaluation  ?Patient identified by MRN, date of birth, ID band ?Patient awake ? ? ? ?Reviewed: ?Allergy & Precautions, H&P , NPO status , Patient's Chart, lab work & pertinent test results ? ?Airway ?Mallampati: II ? ? ?Neck ROM: full ? ? ? Dental ?  ?Pulmonary ? ?  ?breath sounds clear to auscultation ? ? ? ? ? ? Cardiovascular ?+ Valvular Problems/Murmurs AS and AI  ?Rhythm:regular Rate:Normal ? ?Moderate AS with mean PG 23 mmHg.  ?  ?Neuro/Psych ?  ? GI/Hepatic ?  ?Endo/Other  ? ? Renal/GU ?  ? ?  ?Musculoskeletal ? ? Abdominal ?  ?Peds ? Hematology ?  ?Anesthesia Other Findings ? ? Reproductive/Obstetrics ?Breast CA ? ?  ? ? ? ? ? ? ? ? ? ? ? ? ? ?  ?  ? ? ? ? ? ? ? ? ?Anesthesia Physical ?Anesthesia Plan ? ?ASA: 3 ? ?Anesthesia Plan: General  ? ?Post-op Pain Management:   ? ?Induction: Intravenous ? ?PONV Risk Score and Plan: 3 and Ondansetron, Dexamethasone and Treatment may vary due to age or medical condition ? ?Airway Management Planned: LMA ? ?Additional Equipment:  ? ?Intra-op Plan:  ? ?Post-operative Plan: Extubation in OR ? ?Informed Consent: I have reviewed the patients History and Physical, chart, labs and discussed the procedure including the risks, benefits and alternatives for the proposed anesthesia with the patient or authorized representative who has indicated his/her understanding and acceptance.  ? ? ? ?Dental advisory given ? ?Plan Discussed with: CRNA, Anesthesiologist and Surgeon ? ?Anesthesia Plan Comments:   ? ? ? ? ? ? ?Anesthesia Quick Evaluation ? ?

## 2021-08-11 NOTE — Interval H&P Note (Signed)
History and Physical Interval Note: ? ?08/11/2021 ?11:43 AM ? ?Tara Hardin  has presented today for surgery, with the diagnosis of LEFT BREAST CANCER.  The various methods of treatment have been discussed with the patient and family. After consideration of risks, benefits and other options for treatment, the patient has consented to  Procedure(s): ?LEFT BREAST LUMPECTOMY WITH RADIOACTIVE SEED LOCALIZATION X2 (Left) as a surgical intervention.  The patient's history has been reviewed, patient examined, no change in status, stable for surgery.  I have reviewed the patient's chart and labs.  Questions were answered to the patient's satisfaction.   ? ? ?[name] ? ? ?

## 2021-08-12 ENCOUNTER — Encounter (HOSPITAL_BASED_OUTPATIENT_CLINIC_OR_DEPARTMENT_OTHER): Payer: Self-pay | Admitting: General Surgery

## 2021-08-12 NOTE — Progress Notes (Signed)
Left message stating courtesy call and if any questions or concerns please call the doctors office.  

## 2021-08-17 ENCOUNTER — Encounter (HOSPITAL_COMMUNITY): Payer: Self-pay

## 2021-08-18 ENCOUNTER — Encounter: Payer: Self-pay | Admitting: *Deleted

## 2021-08-22 ENCOUNTER — Encounter: Payer: Self-pay | Admitting: *Deleted

## 2021-08-22 LAB — SURGICAL PATHOLOGY

## 2021-09-02 ENCOUNTER — Ambulatory Visit: Payer: Federal, State, Local not specified - PPO | Admitting: Hematology and Oncology

## 2021-09-06 ENCOUNTER — Ambulatory Visit: Payer: Federal, State, Local not specified - PPO | Admitting: Radiation Oncology

## 2021-09-06 ENCOUNTER — Ambulatory Visit: Payer: Federal, State, Local not specified - PPO

## 2021-09-09 ENCOUNTER — Ambulatory Visit: Payer: Self-pay | Admitting: General Surgery

## 2021-09-13 ENCOUNTER — Encounter: Payer: Self-pay | Admitting: *Deleted

## 2021-09-14 ENCOUNTER — Telehealth: Payer: Self-pay | Admitting: Radiation Oncology

## 2021-09-14 NOTE — Telephone Encounter (Signed)
5/3 @ 9:42 am Left voicemail for patient to call our office to be reschedule for consult.   ?

## 2021-09-15 ENCOUNTER — Telehealth: Payer: Self-pay | Admitting: Radiation Oncology

## 2021-09-15 NOTE — Telephone Encounter (Signed)
5/4 @ 2:34 pm pt only receive calls after 2:30 pm.   ?

## 2021-09-15 NOTE — Telephone Encounter (Signed)
5/4 @ 9:02 AM Left voicemail with patient and patient daughter [name] [phone], for patient to call our office to r/s her consult.   ?

## 2021-09-20 ENCOUNTER — Encounter: Payer: Self-pay | Admitting: *Deleted

## 2021-09-27 ENCOUNTER — Ambulatory Visit: Payer: Federal, State, Local not specified - PPO | Admitting: Radiation Oncology

## 2021-09-27 ENCOUNTER — Ambulatory Visit: Payer: Federal, State, Local not specified - PPO

## 2021-09-30 ENCOUNTER — Encounter (HOSPITAL_BASED_OUTPATIENT_CLINIC_OR_DEPARTMENT_OTHER): Payer: Self-pay | Admitting: General Surgery

## 2021-09-30 ENCOUNTER — Other Ambulatory Visit: Payer: Self-pay

## 2021-09-30 NOTE — Progress Notes (Signed)

## 2021-10-07 ENCOUNTER — Ambulatory Visit (HOSPITAL_COMMUNITY)
Admission: RE | Admit: 2021-10-07 | Discharge: 2021-10-07 | Disposition: A | Discharge status: Home / Self Care | Payer: Federal, State, Local not specified - PPO | Attending: General Surgery | Admitting: General Surgery

## 2021-10-07 ENCOUNTER — Ambulatory Visit (HOSPITAL_BASED_OUTPATIENT_CLINIC_OR_DEPARTMENT_OTHER): Payer: Federal, State, Local not specified - PPO | Admitting: Certified Registered"

## 2021-10-07 ENCOUNTER — Other Ambulatory Visit: Payer: Self-pay

## 2021-10-07 ENCOUNTER — Encounter (HOSPITAL_BASED_OUTPATIENT_CLINIC_OR_DEPARTMENT_OTHER)
Admission: RE | Disposition: A | Discharge status: Home / Self Care | Payer: Self-pay | Source: Home / Self Care | Attending: General Surgery

## 2021-10-07 ENCOUNTER — Encounter (HOSPITAL_BASED_OUTPATIENT_CLINIC_OR_DEPARTMENT_OTHER): Payer: Self-pay | Admitting: General Surgery

## 2021-10-07 DIAGNOSIS — Z170 Estrogen receptor positive status [ER+]: Secondary | ICD-10-CM | POA: Insufficient documentation

## 2021-10-07 DIAGNOSIS — I350 Nonrheumatic aortic (valve) stenosis: Secondary | ICD-10-CM | POA: Insufficient documentation

## 2021-10-07 DIAGNOSIS — C50312 Malignant neoplasm of lower-inner quadrant of left female breast: Secondary | ICD-10-CM | POA: Insufficient documentation

## 2021-10-07 DIAGNOSIS — Z803 Family history of malignant neoplasm of breast: Secondary | ICD-10-CM

## 2021-10-07 HISTORY — PX: RE-EXCISION OF BREAST CANCER,SUPERIOR MARGINS: SHX6047

## 2021-10-07 SURGERY — RE-EXCISION OF BREAST CANCER,SUPERIOR MARGINS
Anesthesia: General | Site: Breast | Laterality: Left

## 2021-10-07 MED ORDER — LACTATED RINGERS IV SOLN: INTRAVENOUS | Status: DC

## 2021-10-07 MED ORDER — ONDANSETRON HCL 4 MG/2ML IJ SOLN
INTRAMUSCULAR | Status: DC | PRN
  Administered 2021-10-07: 4 mg via INTRAVENOUS

## 2021-10-07 MED ORDER — PHENYLEPHRINE HCL (PRESSORS) 10 MG/ML IV SOLN
INTRAVENOUS | Status: DC | PRN
  Administered 2021-10-07: 80 ug via INTRAVENOUS

## 2021-10-07 MED ORDER — CEFAZOLIN SODIUM-DEXTROSE 2-4 GM/100ML-% IV SOLN
2.0000 g | INTRAVENOUS | Status: AC
  Administered 2021-10-07: 2 g via INTRAVENOUS

## 2021-10-07 MED ORDER — FENTANYL CITRATE (PF) 100 MCG/2ML IJ SOLN
INTRAMUSCULAR | Status: AC
  Filled 2021-10-07: qty 2

## 2021-10-07 MED ORDER — ONDANSETRON HCL 4 MG/2ML IJ SOLN: 4.0000 mg | Freq: Once | INTRAMUSCULAR | Status: DC | PRN

## 2021-10-07 MED ORDER — GABAPENTIN 300 MG PO CAPS
ORAL_CAPSULE | ORAL | Status: AC
  Filled 2021-10-07: qty 1

## 2021-10-07 MED ORDER — ACETAMINOPHEN 500 MG PO TABS
ORAL_TABLET | ORAL | Status: AC
  Filled 2021-10-07: qty 2

## 2021-10-07 MED ORDER — BUPIVACAINE-EPINEPHRINE 0.25% -1:200000 IJ SOLN
INTRAMUSCULAR | Status: DC | PRN
  Administered 2021-10-07: 6 mL

## 2021-10-07 MED ORDER — CELECOXIB 200 MG PO CAPS
ORAL_CAPSULE | ORAL | Status: AC
  Filled 2021-10-07: qty 1

## 2021-10-07 MED ORDER — CELECOXIB 200 MG PO CAPS
200.0000 mg | ORAL_CAPSULE | ORAL | Status: AC
  Administered 2021-10-07: 200 mg via ORAL

## 2021-10-07 MED ORDER — EPHEDRINE 5 MG/ML INJ
INTRAVENOUS | Status: AC
  Filled 2021-10-07: qty 10

## 2021-10-07 MED ORDER — PHENYLEPHRINE 80 MCG/ML (10ML) SYRINGE FOR IV PUSH (FOR BLOOD PRESSURE SUPPORT)
PREFILLED_SYRINGE | INTRAVENOUS | Status: AC
  Filled 2021-10-07: qty 10

## 2021-10-07 MED ORDER — OXYCODONE HCL 5 MG PO TABS: 5.0000 mg | ORAL_TABLET | Freq: Once | ORAL | Status: DC | PRN

## 2021-10-07 MED ORDER — CEFAZOLIN SODIUM-DEXTROSE 2-4 GM/100ML-% IV SOLN
INTRAVENOUS | Status: AC
  Filled 2021-10-07: qty 100

## 2021-10-07 MED ORDER — PROPOFOL 10 MG/ML IV BOLUS
INTRAVENOUS | Status: DC | PRN
  Administered 2021-10-07: 150 mg via INTRAVENOUS

## 2021-10-07 MED ORDER — GABAPENTIN 300 MG PO CAPS: 300.0000 mg | ORAL_CAPSULE | ORAL | Status: DC

## 2021-10-07 MED ORDER — DEXAMETHASONE SODIUM PHOSPHATE 10 MG/ML IJ SOLN
INTRAMUSCULAR | Status: AC
  Filled 2021-10-07: qty 1

## 2021-10-07 MED ORDER — ACETAMINOPHEN 500 MG PO TABS
1000.0000 mg | ORAL_TABLET | ORAL | Status: AC
  Administered 2021-10-07: 1000 mg via ORAL

## 2021-10-07 MED ORDER — CHLORHEXIDINE GLUCONATE CLOTH 2 % EX PADS: 6.0000 | MEDICATED_PAD | Freq: Once | CUTANEOUS | Status: DC

## 2021-10-07 MED ORDER — LIDOCAINE 2% (20 MG/ML) 5 ML SYRINGE
INTRAMUSCULAR | Status: AC
  Filled 2021-10-07: qty 5

## 2021-10-07 MED ORDER — ONDANSETRON HCL 4 MG/2ML IJ SOLN
INTRAMUSCULAR | Status: AC
  Filled 2021-10-07: qty 2

## 2021-10-07 MED ORDER — OXYCODONE HCL 5 MG PO TABS: 5.0000 mg | ORAL_TABLET | Freq: Four times a day (QID) | ORAL | 0 refills | Status: DC | PRN

## 2021-10-07 MED ORDER — FENTANYL CITRATE (PF) 100 MCG/2ML IJ SOLN: 25.0000 ug | INTRAMUSCULAR | Status: DC | PRN

## 2021-10-07 MED ORDER — EPHEDRINE SULFATE (PRESSORS) 50 MG/ML IJ SOLN
INTRAMUSCULAR | Status: DC | PRN
Start: 2021-10-07 — End: 2021-10-07
  Administered 2021-10-07: 10 mg via INTRAVENOUS
  Administered 2021-10-07 (×3): 5 mg via INTRAVENOUS

## 2021-10-07 MED ORDER — OXYCODONE HCL 5 MG/5ML PO SOLN: 5.0000 mg | Freq: Once | ORAL | Status: DC | PRN

## 2021-10-07 MED ORDER — DEXAMETHASONE SODIUM PHOSPHATE 10 MG/ML IJ SOLN
INTRAMUSCULAR | Status: DC | PRN
  Administered 2021-10-07: 5 mg via INTRAVENOUS

## 2021-10-07 MED ORDER — FENTANYL CITRATE (PF) 100 MCG/2ML IJ SOLN
INTRAMUSCULAR | Status: DC | PRN
  Administered 2021-10-07: 25 ug via INTRAVENOUS

## 2021-10-07 MED ORDER — LIDOCAINE HCL (CARDIAC) PF 100 MG/5ML IV SOSY
PREFILLED_SYRINGE | INTRAVENOUS | Status: DC | PRN
  Administered 2021-10-07: 60 mg via INTRAVENOUS

## 2021-10-07 SURGICAL SUPPLY — 41 items
ADH SKN CLS APL DERMABOND .7 (GAUZE/BANDAGES/DRESSINGS) ×1
APL PRP STRL LF DISP 70% ISPRP (MISCELLANEOUS) ×1
BLADE SURG 15 STRL LF DISP TIS (BLADE) ×1 IMPLANT
BLADE SURG 15 STRL SS (BLADE) ×2
CANISTER SUCT 1200ML W/VALVE (MISCELLANEOUS) ×2 IMPLANT
CHLORAPREP W/TINT 26 (MISCELLANEOUS) ×2 IMPLANT
CLIP TI WIDE RED SMALL 6 (CLIP) IMPLANT
COVER BACK TABLE 60X90IN (DRAPES) ×2 IMPLANT
COVER MAYO STAND STRL (DRAPES) ×2 IMPLANT
DERMABOND ADVANCED (GAUZE/BANDAGES/DRESSINGS) ×1
DERMABOND ADVANCED .7 DNX12 (GAUZE/BANDAGES/DRESSINGS) ×1 IMPLANT
DRAPE LAPAROSCOPIC ABDOMINAL (DRAPES) ×2 IMPLANT
DRAPE UTILITY XL STRL (DRAPES) ×2 IMPLANT
ELECT COATED BLADE 2.86 ST (ELECTRODE) ×2 IMPLANT
ELECT REM PT RETURN 9FT ADLT (ELECTROSURGICAL) ×2
ELECTRODE REM PT RTRN 9FT ADLT (ELECTROSURGICAL) ×1 IMPLANT
GLOVE BIO SURGEON STRL SZ7.5 (GLOVE) ×3 IMPLANT
GLOVE BIOGEL PI IND STRL 7.5 (GLOVE) IMPLANT
GLOVE BIOGEL PI INDICATOR 7.5 (GLOVE) ×1
GLOVE SURG SS PI 7.0 STRL IVOR (GLOVE) ×1 IMPLANT
GOWN STRL REUS W/ TWL LRG LVL3 (GOWN DISPOSABLE) ×2 IMPLANT
GOWN STRL REUS W/TWL LRG LVL3 (GOWN DISPOSABLE) ×4
ILLUMINATOR WAVEGUIDE N/F (MISCELLANEOUS) IMPLANT
LIGHT WAVEGUIDE WIDE FLAT (MISCELLANEOUS) IMPLANT
NDL HYPO 25X1 1.5 SAFETY (NEEDLE) ×1 IMPLANT
NEEDLE HYPO 25X1 1.5 SAFETY (NEEDLE) ×2 IMPLANT
NS IRRIG 1000ML POUR BTL (IV SOLUTION) IMPLANT
PACK BASIN DAY SURGERY FS (CUSTOM PROCEDURE TRAY) ×2 IMPLANT
PENCIL SMOKE EVACUATOR (MISCELLANEOUS) ×2 IMPLANT
SLEEVE SCD COMPRESS KNEE MED (STOCKING) ×2 IMPLANT
SPIKE FLUID TRANSFER (MISCELLANEOUS) ×2 IMPLANT
SPONGE T-LAP 18X18 ~~LOC~~+RFID (SPONGE) ×2 IMPLANT
STAPLER VISISTAT 35W (STAPLE) IMPLANT
SUT MON AB 4-0 PC3 18 (SUTURE) ×2 IMPLANT
SUT SILK 2 0 SH (SUTURE) IMPLANT
SUT VICRYL 3-0 CR8 SH (SUTURE) ×2 IMPLANT
SYR CONTROL 10ML LL (SYRINGE) ×2 IMPLANT
TOWEL GREEN STERILE FF (TOWEL DISPOSABLE) ×2 IMPLANT
TRAY FAXITRON CT DISP (TRAY / TRAY PROCEDURE) IMPLANT
TUBE CONNECTING 20X1/4 (TUBING) ×2 IMPLANT
YANKAUER SUCT BULB TIP NO VENT (SUCTIONS) ×2 IMPLANT

## 2021-10-07 NOTE — Anesthesia Postprocedure Evaluation (Signed)
Anesthesia Post Note  Patient: Ermine Stebbins  Procedure(s) Performed: RE-EXCISION OF LEFT BREAST INFERIOR MARGIN (Left: Breast)     Patient location during evaluation: PACU Anesthesia Type: General Level of consciousness: awake and alert Pain management: pain level controlled Vital Signs Assessment: post-procedure vital signs reviewed and stable Respiratory status: spontaneous breathing, nonlabored ventilation, respiratory function stable and patient connected to nasal cannula oxygen Cardiovascular status: blood pressure returned to baseline and stable Postop Assessment: no apparent nausea or vomiting Anesthetic complications: no   No notable events documented.  Last Vitals:  Vitals:   10/07/21 1455 10/07/21 1501  BP: 121/61 (!) 142/88  Pulse: 81 81  Resp: 20 16  Temp:  36.6 C  SpO2: 97% 96%    Last Pain:  Vitals:   10/07/21 1501  TempSrc:   PainSc: 0-No pain                 Ruqaya Strauss S

## 2021-10-07 NOTE — Transfer of Care (Signed)
Immediate Anesthesia Transfer of Care Note  Patient: Tara Hardin  Procedure(s) Performed: RE-EXCISION OF LEFT BREAST INFERIOR MARGIN (Left: Breast)  Patient Location: PACU  Anesthesia Type:General  Level of Consciousness: drowsy  Airway & Oxygen Therapy: Patient Spontanous Breathing and Patient connected to face mask oxygen  Post-op Assessment: Report given to RN and Post -op Vital signs reviewed and stable  Post vital signs: Reviewed and stable  Last Vitals:  Vitals Value Taken Time  BP 121/55 10/07/21 1445  Temp 36.5 C 10/07/21 1442  Pulse 78 10/07/21 1445  Resp 18 10/07/21 1445  SpO2 100 % 10/07/21 1445  Vitals shown include unvalidated device data.  Last Pain:  Vitals:   10/07/21 1442  TempSrc:   PainSc: Asleep         Complications: No notable events documented.

## 2021-10-07 NOTE — Op Note (Signed)
10/07/2021  2:35 PM  PATIENT:  Tara Hardin  81 y.o. female  PRE-OPERATIVE DIAGNOSIS:  LEFT BREAST CANCER  POST-OPERATIVE DIAGNOSIS:  LEFT BREAST CANCER  PROCEDURE:  Procedure(s): RE-EXCISION OF LEFT BREAST INFERIOR MARGIN (Left)  SURGEON:  Surgeon(s) and Role:    * Jovita Kussmaul, MD - Primary  PHYSICIAN ASSISTANT:   ASSISTANTS: none   ANESTHESIA:   local and general  EBL:  5 mL   BLOOD ADMINISTERED:none  DRAINS: none   LOCAL MEDICATIONS USED:  MARCAINE     SPECIMEN:  Source of Specimen:  left breast inferior margin  DISPOSITION OF SPECIMEN:  PATHOLOGY  COUNTS:  YES  TOURNIQUET:  * No tourniquets in log *  DICTATION: .Dragon Dictation  After informed consent was obtained the patient was brought to the operating room and placed in the supine position on the operating table.  After adequate induction of general anesthesia the patient's left breast was prepped with ChloraPrep, allowed to dry, and draped in usual sterile manner.  An appropriate timeout was performed.  The area around the previous incision was infiltrated with quarter percent Marcaine.  A curvilinear incision was made through her previous incision along the inner edge of the areola of the left breast.  The incision was carried through the subcutaneous tissue bluntly with a hemostat until the previous lumpectomy site was identified.  The lumpectomy cavity was opened bluntly.  The inferior edge of the lumpectomy cavity was reexcised sharply with the electrocautery.  Once the specimen was removed it was oriented with the appropriate paint color.  The margin was then sent to pathology for further evaluation.  The previous lumpectomy had been taken all the way to the chest wall muscle so there was no more deep margin to removed.  The breast tissue around the lumpectomy cavity was then mobilized sharply with the electrocautery so that it could be brought back together.  Hemostasis was achieved using the Bovie  electrocautery.  The lumpectomy cavity was then closed with layers of interrupted 3-0 Vicryl stitches.  The skin was then closed with interrupted 4-0 Monocryl subcuticular stitches.  Dermabond dressings were applied.  The patient tolerated the procedure well.  At the end of the case all needle sponge and instrument counts were correct.  The patient was then awakened and taken to recovery in stable condition.  PLAN OF CARE: Discharge to home after PACU  PATIENT DISPOSITION:  PACU - hemodynamically stable.   Delay start of Pharmacological VTE agent (>24hrs) due to surgical blood loss or risk of bleeding: not applicable

## 2021-10-07 NOTE — Discharge Instructions (Signed)
May take Tylenol after 7:45pm, if needed. May take NSAIDS (ibuprofen, motrin) after 7:45pm, if needed   Post Anesthesia Home Care Instructions  Activity: Get plenty of rest for the remainder of the day. A responsible individual must stay with you for 24 hours following the procedure.  For the next 24 hours, DO NOT: -Drive a car -Paediatric nurse -Drink alcoholic beverages -Take any medication unless instructed by your physician -Make any legal decisions or sign important papers.  Meals: Start with liquid foods such as gelatin or soup. Progress to regular foods as tolerated. Avoid greasy, spicy, heavy foods. If nausea and/or vomiting occur, drink only clear liquids until the nausea and/or vomiting subsides. Call your physician if vomiting continues.  Special Instructions/Symptoms: Your throat may feel dry or sore from the anesthesia or the breathing tube placed in your throat during surgery. If this causes discomfort, gargle with warm salt water. The discomfort should disappear within 24 hours.  If you had a scopolamine patch placed behind your ear for the management of post- operative nausea and/or vomiting:  1. The medication in the patch is effective for 72 hours, after which it should be removed.  Wrap patch in a tissue and discard in the trash. Wash hands thoroughly with soap and water. 2. You may remove the patch earlier than 72 hours if you experience unpleasant side effects which may include dry mouth, dizziness or visual disturbances. 3. Avoid touching the patch. Wash your hands with soap and water after contact with the patch.

## 2021-10-07 NOTE — H&P (Signed)
PROVIDER: Landry Corporal, MD  MRN: [id number] DOB: [birth year] Subjective   Chief Complaint: Follow-up   History of Present Illness: Tara Hardin is a 81 y.o. female who is seen today for left breast cancer. 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 She tolerated the surgery well. 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 The posterior margin was the muscle of the chest wall.    Review of Systems: A complete review of systems was obtained from the patient. I have reviewed this information and discussed as appropriate with the patient. See HPI as well for other ROS.  ROS   Medical History: Past Medical History:  Diagnosis Date   Arthritis   Hyperlipidemia   Patient Active Problem List  Diagnosis   Malignant neoplasm of upper-inner quadrant of left breast in female, estrogen receptor positive (CMS-HCC)   Malignant neoplasm of lower-inner quadrant of left breast in female, estrogen receptor positive (CMS-HCC)   History reviewed. No pertinent surgical history.   No Known Allergies  No current outpatient medications on file prior to visit.   No current facility-administered medications on file prior to visit.   Family History  Problem Relation Age of Onset   Skin cancer Sister    Social History   Tobacco Use  Smoking Status Never  Smokeless Tobacco Never    Social History   Socioeconomic History   Marital status: Unknown  Tobacco Use   Smoking status: Never   Smokeless tobacco: Never  Vaping Use   Vaping Use: Never used  Substance and Sexual Activity   Alcohol use: Never   Drug use: Never   Objective:   There were no vitals filed for this visit.  There is no height or weight on file to calculate BMI.  Physical Exam Vitals  reviewed.  Constitutional:  General: She is not in acute distress. Appearance: Normal appearance.  HENT:  Head: Normocephalic and atraumatic.  Right Ear: External ear normal.  Left Ear: External ear normal.  Nose: Nose normal.  Mouth/Throat:  Mouth: Mucous membranes are moist.  Pharynx: Oropharynx is clear.  Eyes:  General: No scleral icterus. Extraocular Movements: Extraocular movements intact.  Conjunctiva/sclera: Conjunctivae normal.  Pupils: Pupils are equal, round, and reactive to light.  Cardiovascular:  Rate and Rhythm: Normal rate and regular rhythm.  Pulses: Normal pulses.  Heart sounds: Normal heart sounds.  Pulmonary:  Effort: Pulmonary effort is normal. No respiratory distress.  Breath sounds: Normal breath sounds.  Abdominal:  General: Bowel sounds are normal.  Palpations: Abdomen is soft.  Tenderness: There is no abdominal tenderness.  Musculoskeletal:  General: No swelling, tenderness or deformity. Normal range of motion.  Cervical back: Normal range of motion and neck supple.  Skin: General: Skin is warm and dry.  Coloration: Skin is not jaundiced.  Neurological:  General: No focal deficit present.  Mental Status: She is alert and oriented to person, place, and time.  Psychiatric:  Mood and Affect: Mood normal.  Behavior: Behavior normal.     Breast: The left breast periareolar incision is healing nicely with no sign of infection or seroma.  Labs, Imaging and Diagnostic Testing:  Assessment and Plan:   Diagnoses and all orders for this visit:  Malignant neoplasm of lower-inner quadrant of  left breast in female, estrogen receptor positive (CMS-HCC)  Other orders - EXTERNAL RADIOLOGY RESULT - OTHER - EXTERNAL RADIOLOGY RESULT - OTHER    The patient is about 1 month status post left breast lumpectomy for breast cancer. She tolerated the surgery well. She did have a positive inferior and posterior margin for DCIS. The posterior margin was the  chest wall. To make sure that the cancer has been completely removed she will need reexcision of her inferior margin. I have discussed with her in detail the risks and benefits of the operation as well as some of the technical aspects and she understands and wishes to proceed.

## 2021-10-07 NOTE — Anesthesia Preprocedure Evaluation (Signed)
Anesthesia Evaluation  Patient identified by MRN, date of birth, ID band Patient awake    Reviewed: Allergy & Precautions, NPO status , Patient's Chart, lab work & pertinent test results  Airway Mallampati: II  TM Distance: >3 FB Neck ROM: Full    Dental no notable dental hx.    Pulmonary neg pulmonary ROS,    Pulmonary exam normal breath sounds clear to auscultation       Cardiovascular + Valvular Problems/Murmurs AS  Rhythm:Regular Rate:Normal + Systolic murmurs Left Ventricle: Left ventricular ejection fraction, by estimation, is 60  to 65%. The left ventricle has normal function. The left ventricle has no  regional wall motion abnormalities. The left ventricular internal cavity  size was normal in size. There is  no left ventricular hypertrophy. Left ventricular diastolic parameters  are consistent with Grade I diastolic dysfunction (impaired relaxation).   Right Ventricle: The right ventricular size is normal. No increase in  right ventricular wall thickness. Right ventricular systolic function is  normal.   Left Atrium: Left atrial size was moderately dilated.   Right Atrium: Right atrial size was normal in size.   Pericardium: There is no evidence of pericardial effusion.   Mitral Valve: The mitral valve is normal in structure. No evidence of  mitral valve regurgitation. No evidence of mitral valve stenosis.   Tricuspid Valve: The tricuspid valve is normal in structure. Tricuspid  valve regurgitation is mild . No evidence of tricuspid stenosis.   Aortic Valve: The aortic valve is normal in structure. Aortic valve  regurgitation is mild. Aortic regurgitation PHT measures 612 msec.  Moderate aortic stenosis is present. Aortic valve mean gradient measures  23.7 mmHg. Aortic valve peak gradient measures  39.7 mmHg. Aortic valve area, by VTI measures 0.93 cm.    Neuro/Psych negative neurological ROS  negative  psych ROS   GI/Hepatic negative GI ROS, Neg liver ROS,   Endo/Other  negative endocrine ROS  Renal/GU negative Renal ROS  negative genitourinary   Musculoskeletal negative musculoskeletal ROS (+)   Abdominal   Peds negative pediatric ROS (+)  Hematology negative hematology ROS (+)   Anesthesia Other Findings   Reproductive/Obstetrics negative OB ROS                             Anesthesia Physical Anesthesia Plan  ASA: 3  Anesthesia Plan: General   Post-op Pain Management: Minimal or no pain anticipated   Induction: Intravenous  PONV Risk Score and Plan: 3 and Ondansetron, Dexamethasone and Treatment may vary due to age or medical condition  Airway Management Planned: LMA  Additional Equipment:   Intra-op Plan:   Post-operative Plan: Extubation in OR  Informed Consent: I have reviewed the patients History and Physical, chart, labs and discussed the procedure including the risks, benefits and alternatives for the proposed anesthesia with the patient or authorized representative who has indicated his/her understanding and acceptance.     Dental advisory given  Plan Discussed with: CRNA and Surgeon  Anesthesia Plan Comments:         Anesthesia Quick Evaluation

## 2021-10-07 NOTE — Anesthesia Procedure Notes (Signed)
Procedure Name: LMA Insertion Date/Time: 10/07/2021 2:01 PM Performed by: Lavonia Dana, CRNA Pre-anesthesia Checklist: Patient identified, Emergency Drugs available, Suction available and Patient being monitored Patient Re-evaluated:Patient Re-evaluated prior to induction Oxygen Delivery Method: Circle system utilized Preoxygenation: Pre-oxygenation with 100% oxygen Induction Type: IV induction Ventilation: Mask ventilation without difficulty LMA: LMA inserted LMA Size: 3.0 Number of attempts: 1 Airway Equipment and Method: Bite block Placement Confirmation: positive ETCO2 Tube secured with: Tape Dental Injury: Teeth and Oropharynx as per pre-operative assessment

## 2021-10-11 ENCOUNTER — Encounter (HOSPITAL_BASED_OUTPATIENT_CLINIC_OR_DEPARTMENT_OTHER): Payer: Self-pay | Admitting: General Surgery

## 2021-10-11 LAB — SURGICAL PATHOLOGY

## 2021-10-11 NOTE — Progress Notes (Signed)
Left message stating courtesy call and if any questions or concerns please call the doctors office.  

## 2021-10-12 ENCOUNTER — Encounter: Payer: Self-pay | Admitting: *Deleted

## 2021-10-19 NOTE — Progress Notes (Incomplete)
New Breast Cancer Diagnosis: Left Breast LIQ  Did patient present with symptoms (if so, please note symptoms) or screening mammography?: Screening mammogram found 2 small masses in the lower inner quadrant of the left breast.   Location and Extent of disease :left breast. Located at 9 o'clock position, measured 1.9 cm in greatest dimension. Adenopathy no.  Re-Excision of Left Breast 10/07/2021   Histology per Pathology Report: grade 2, Invasive Ductal Carcinoma and DCIS 08/11/2021  Receptor Status: ER(positive), PR (positive), Her2-neu (negative), Ki-(1%)  Surgeon and surgical plan, if any: Dr. Marlou Starks  -Re-excision of left breast inferior margin 10/07/2021 -Left Breast Lumpectomy with radioactive seed localization x2 08/11/2021    Medical oncologist, treatment if any:      Family History of Breast/Ovarian/Prostate Cancer:   Lymphedema issues, if any:      Pain issues, if any:     SAFETY ISSUES: Prior radiation?  Pacemaker/ICD?  Possible current pregnancy? Postmenopausal Is the patient on methotrexate?   Current Complaints / other details:

## 2021-10-31 NOTE — Progress Notes (Signed)
Radiation Oncology         [phone] ________________________________  Name: Tara Hardin        MRN: [id number]  Date of Service: 11/02/2021 DOB: February 02, 1941  MM:HWKGS, Hal Hope, MD  Tara Lose, MD     REFERRING PHYSICIAN: Nicholas Lose, MD   DIAGNOSIS: There were no encounter diagnoses.   HISTORY OF PRESENT ILLNESS: Tara Hardin is a 81 y.o. female originally seen in the multidisciplinary breast clinic for a new diagnosis of left breast cancer. The patient was noted to have screening detected mass in the 9:00 position by diagnostic imaging that measured 1 cm. Her axilla was not scanned and she had a 5 mm satellite lesion. 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   Since her last visit, she underwent a left breast lumpectomy showing a grade 2 invasive ductal carcinoma measuring 2 cm with associated DCIS that was intermediate grade.  Resection margins were negative with the closest being 1 mm to the superior margin and DCIS involving the posterior and inferior margins.  Her pathology confirmed HER2 negative features by FISH. She underwent reexcision of her margins on 10/07/2021 which showed benign breast parenchyma with postprocedural changes negative for invasive or in situ disease.  She is seen today to discuss adjuvant radiotherapy.    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   Nonrheumatic tricuspid (valve) insufficiency 07/21/2020   Prediabetes  08/30/2020       PAST SURGICAL HISTORY: Past Surgical History:  Procedure Laterality Date   BREAST LUMPECTOMY WITH RADIOACTIVE SEED LOCALIZATION Left 08/11/2021   Procedure: LEFT BREAST LUMPECTOMY WITH RADIOACTIVE SEED LOCALIZATION X2;  Surgeon: Jovita Kussmaul, MD;  Location: [place];  Service: General;  Laterality: Left;   RE-EXCISION OF BREAST CANCER,SUPERIOR MARGINS Left 10/07/2021   Procedure: RE-EXCISION OF LEFT BREAST INFERIOR MARGIN;  Surgeon: Jovita Kussmaul, MD;  Location: [place];  Service: General;  Laterality: Left;     FAMILY HISTORY:  Family History  Problem Relation Age of Onset   Lung cancer Mother        d. 17   Heart attack Sister    Hypertension Sister    Atrial fibrillation Brother    Hypertension Brother    Heart attack Brother    Irregular heart beat Brother    Breast cancer Paternal Aunt        dx after 9   Breast cancer Cousin        paternal female cousin     SOCIAL HISTORY:  reports that she has never smoked. She has never used smokeless tobacco. She reports that she does not currently use alcohol. She reports that she does not use drugs. The patient is married and lives in [place].    ALLERGIES: Patient has no known allergies.   MEDICATIONS:  Current Outpatient Medications  Medication Sig Dispense Refill   Biotin 1 MG CAPS Take by mouth.  Multiple Vitamin (MULTIVITAMIN) tablet Take 1 tablet by mouth daily.     Omega-3 Fatty Acids (FISH OIL) 1000 MG CAPS Take by mouth.     OVER THE COUNTER MEDICATION Take 5,000 mcg by mouth. Garlinase     oxyCODONE (ROXICODONE) 5 MG immediate release tablet Take 1 tablet (5 mg total) by mouth every 6 (six) hours as needed for severe pain. 10 tablet 0   Progesterone Micronized (PROGESTERONE PO) Take by mouth.     Red Yeast Rice Extract (RED YEAST RICE PO) Take by mouth.     triamcinolone ointment (KENALOG) 0.1 % Apply 1 application topically 2 (two) times daily.     No  current facility-administered medications for this visit.     REVIEW OF SYSTEMS: On review of systems, the patient reports that she is doing ***     PHYSICAL EXAM:  Wt Readings from Last 3 Encounters:  10/07/21 115 lb 15.4 oz (52.6 kg)  08/11/21 118 lb 6.2 oz (53.7 kg)  07/20/21 119 lb 6.4 oz (54.2 kg)   Temp Readings from Last 3 Encounters:  10/07/21 97.9 F (36.6 C)  08/11/21 (!) 97.1 F (36.2 C)  07/20/21 97.7 F (36.5 C) (Tympanic)   BP Readings from Last 3 Encounters:  10/07/21 (!) 142/88  08/11/21 120/68  07/20/21 (!) 142/52   Pulse Readings from Last 3 Encounters:  10/07/21 81  08/11/21 78  07/20/21 89    In general this is an elderly appearing caucasian female in no acute distress. She's alert and oriented x4 and appropriate throughout the examination. Cardiopulmonary assessment is negative for acute distress and she exhibits normal effort.  Her left breast incision site is well-healed without erythema, separation or drainage.***  ECOG = ***  0 - Asymptomatic (Fully active, able to carry on all predisease activities without restriction)  1 - Symptomatic but completely ambulatory (Restricted in physically strenuous activity but ambulatory and able to carry out work of a light or sedentary nature. For example, light housework, office work)  2 - Symptomatic, <50% in bed during the day (Ambulatory and capable of all self care but unable to carry out any work activities. Up and about more than 50% of waking hours)  3 - Symptomatic, >50% in bed, but not bedbound (Capable of only limited self-care, confined to bed or chair 50% or more of waking hours)  4 - Bedbound (Completely disabled. Cannot carry on any self-care. Totally confined to bed or chair)  5 - Death   Eustace Pen MM, Creech RH, Tormey DC, et al. [phone]). "Toxicity and response criteria of the [facility]". Mobile Oncol. 5 (6): 649-55    LABORATORY DATA:  Lab Results  Component  Value Date   WBC 6.1 07/20/2021   HGB 12.1 07/20/2021   HCT 37.4 07/20/2021   MCV 94.0 07/20/2021   PLT 226 07/20/2021   Lab Results  Component Value Date   NA 142 07/20/2021   K 4.0 07/20/2021   CL 106 07/20/2021   CO2 29 07/20/2021   Lab Results  Component Value Date   ALT 18 07/20/2021   AST 24 07/20/2021   ALKPHOS 79 07/20/2021   BILITOT 0.5 07/20/2021      RADIOGRAPHY: No results found.     IMPRESSION/PLAN: 1. Stage IA, pT2, cN0M0, grade 2  ER/PR positive, invasive ductal carcinoma of the left breast. I reviewed the patient's final pathology results, and we discussed the rationale for external radiotherapy to the breast  to reduce  risks of local recurrence followed by antiestrogen therapy.  I also reviewed cases in which radiation may be optional for favorable cases based on final pathology.*** We discussed the risks, benefits, short, and long term effects of radiotherapy, as well as the curative intent if she proceeds.  I discussed the delivery and logistics of radiotherapy and recommend a course of 4  weeks of radiotherapy to the left breast with deep inspiration breath hold technique.      In a visit lasting *** minutes, greater than 50% of the time was spent face to face reviewing her case, as well as in preparation of, discussing, and coordinating the patient's care.   ________________________________   Jodelle Gross, MD, PhD     **Disclaimer: This note was dictated with voice recognition software. Similar sounding words can inadvertently be transcribed and this note may contain transcription errors which may not have been corrected upon publication of note.**

## 2021-11-01 ENCOUNTER — Ambulatory Visit: Payer: Federal, State, Local not specified - PPO | Admitting: Radiation Oncology

## 2021-11-01 ENCOUNTER — Ambulatory Visit: Payer: Federal, State, Local not specified - PPO

## 2021-11-01 NOTE — Progress Notes (Signed)
Location of Breast Cancer:  Malignant neoplasm of lower-inner quadrant of left breast in female, estrogen receptor positive  Histology per Pathology Report:  10/07/2021 FINAL MICROSCOPIC DIAGNOSIS:  A. BREAST, LEFT INFERIOR MARGIN, RE-EXCISION:  - Benign breast parenchyma with previous procedure-related changes  - Negative for in situ or invasive carcinoma   08/11/2021 FINAL MICROSCOPIC DIAGNOSIS:  A. BREAST, LEFT, LUMPECTOMY:  - Invasive ductal carcinoma, grade 2, spanning an area of 2 cm.  See comment  - Ductal carcinoma in situ, intermediate grade  - Resection margins are negative for invasive carcinoma - closest is the superior margin at 0.1 cm  - DCIS focally involves posterior and inferior margins  - Biopsy site changes  - See oncology table  COMMENT:  Immunostains for p63, calponin and SMM-1 confirm presence of invasive carcinoma.  Receptor Status: ER(50%), PR (50%), Her2-neu (Negative via FISH), Ki-67(1%)  Did patient present with symptoms (if so, please note symptoms) or was this found on screening mammography?: Screening mammogram on 02/21/2021 showed 7 mm possible asymmetry at 8:00 in the left breast. Diagnostic mammogram and [place] on 05/11/2021 showed 0.6 x 0.7 cm round mass at 8:00 in the left breast  Past/Anticipated interventions by surgeon, if any:  10/07/2021 --Dr. Autumn Messing RE-EXCISION [place]  08/11/2021 --Dr. Autumn Messing LEFT BREAST LUMPECTOMY WITH RADIOACTIVE SEED LOCALIZATION X2  Past/Anticipated interventions by medical oncology, if any:  Under care of Dr. Nicholas Lose 07/20/2021 (Breast Clinic) --Recommendations: 1. Breast conserving surgery followed by 2. Adjuvant radiation therapy followed by 3. Adjuvant antiestrogen therapy --Return to clinic after surgery to discuss final pathology report   Lymphedema issues, if any:  No    Pain issues, if any:  no   SAFETY ISSUES: Prior radiation? No Pacemaker/ICD? No Possible current  pregnancy? No--postmenopausal Is the patient on methotrexate? No  Current Complaints / other details:  No further complaints. Voices concerns over taking care of husband

## 2021-11-02 ENCOUNTER — Ambulatory Visit
Admission: RE | Admit: 2021-11-02 | Discharge: 2021-11-02 | Disposition: A | Discharge status: Home / Self Care | Payer: Federal, State, Local not specified - PPO | Source: Ambulatory Visit | Attending: Radiation Oncology | Admitting: Radiation Oncology

## 2021-11-02 ENCOUNTER — Ambulatory Visit: Payer: Federal, State, Local not specified - PPO | Admitting: Radiation Oncology

## 2021-11-02 ENCOUNTER — Other Ambulatory Visit: Payer: Self-pay

## 2021-11-02 ENCOUNTER — Encounter: Payer: Self-pay | Admitting: Radiation Oncology

## 2021-11-02 VITALS — BP 120/49 | HR 73 | Temp 98.6°F | Resp 18 | Ht 64.0 in | Wt 116.1 lb

## 2021-11-02 DIAGNOSIS — C50212 Malignant neoplasm of upper-inner quadrant of left female breast: Secondary | ICD-10-CM | POA: Insufficient documentation

## 2021-11-02 DIAGNOSIS — Z170 Estrogen receptor positive status [ER+]: Secondary | ICD-10-CM | POA: Insufficient documentation

## 2021-11-04 ENCOUNTER — Encounter: Payer: Self-pay | Admitting: *Deleted

## 2021-11-07 ENCOUNTER — Other Ambulatory Visit: Payer: Self-pay

## 2021-11-07 ENCOUNTER — Ambulatory Visit
Admission: RE | Admit: 2021-11-07 | Discharge: 2021-11-07 | Disposition: A | Discharge status: Home / Self Care | Payer: Federal, State, Local not specified - PPO | Source: Ambulatory Visit | Attending: Radiation Oncology | Admitting: Radiation Oncology

## 2021-11-07 ENCOUNTER — Telehealth: Payer: Self-pay | Admitting: Hematology and Oncology

## 2021-11-07 DIAGNOSIS — C50212 Malignant neoplasm of upper-inner quadrant of left female breast: Secondary | ICD-10-CM | POA: Diagnosis present

## 2021-11-07 DIAGNOSIS — Z170 Estrogen receptor positive status [ER+]: Secondary | ICD-10-CM | POA: Diagnosis present

## 2021-11-07 NOTE — Telephone Encounter (Signed)
.  Called patient to schedule appointment per 6/24 inbasket, patient is aware of date and time.

## 2021-11-16 DIAGNOSIS — C50212 Malignant neoplasm of upper-inner quadrant of left female breast: Secondary | ICD-10-CM | POA: Insufficient documentation

## 2021-11-16 DIAGNOSIS — Z170 Estrogen receptor positive status [ER+]: Secondary | ICD-10-CM | POA: Insufficient documentation

## 2021-11-17 ENCOUNTER — Other Ambulatory Visit: Payer: Self-pay

## 2021-11-17 ENCOUNTER — Ambulatory Visit
Admission: RE | Admit: 2021-11-17 | Discharge: 2021-11-17 | Disposition: A | Discharge status: Home / Self Care | Payer: Federal, State, Local not specified - PPO | Source: Ambulatory Visit | Attending: Radiation Oncology | Admitting: Radiation Oncology

## 2021-11-17 DIAGNOSIS — C50212 Malignant neoplasm of upper-inner quadrant of left female breast: Secondary | ICD-10-CM | POA: Diagnosis not present

## 2021-11-17 LAB — RAD ONC ARIA SESSION SUMMARY
Course Elapsed Days: 0
Plan Fractions Treated to Date: 1
Plan Prescribed Dose Per Fraction: 5.7 Gy
Plan Total Fractions Prescribed: 5
Plan Total Prescribed Dose: 28.5 Gy
Reference Point Dosage Given to Date: 5.7 Gy
Reference Point Session Dosage Given: 5.7 Gy
Session Number: 1

## 2021-11-18 ENCOUNTER — Ambulatory Visit: Payer: Federal, State, Local not specified - PPO

## 2021-11-21 ENCOUNTER — Ambulatory Visit: Payer: Federal, State, Local not specified - PPO

## 2021-11-22 ENCOUNTER — Ambulatory Visit: Payer: Federal, State, Local not specified - PPO

## 2021-11-22 ENCOUNTER — Encounter: Payer: Self-pay | Admitting: *Deleted

## 2021-11-23 ENCOUNTER — Ambulatory Visit: Payer: Federal, State, Local not specified - PPO

## 2021-11-24 ENCOUNTER — Other Ambulatory Visit: Payer: Self-pay

## 2021-11-24 ENCOUNTER — Ambulatory Visit
Admission: RE | Admit: 2021-11-24 | Discharge: 2021-11-24 | Disposition: A | Discharge status: Home / Self Care | Payer: Federal, State, Local not specified - PPO | Source: Ambulatory Visit | Attending: Radiation Oncology | Admitting: Radiation Oncology

## 2021-11-24 DIAGNOSIS — C50212 Malignant neoplasm of upper-inner quadrant of left female breast: Secondary | ICD-10-CM | POA: Diagnosis not present

## 2021-11-24 LAB — RAD ONC ARIA SESSION SUMMARY
Course Elapsed Days: 7
Plan Fractions Treated to Date: 2
Plan Prescribed Dose Per Fraction: 5.7 Gy
Plan Total Fractions Prescribed: 5
Plan Total Prescribed Dose: 28.5 Gy
Reference Point Dosage Given to Date: 11.4 Gy
Reference Point Session Dosage Given: 5.7 Gy
Session Number: 2

## 2021-12-01 ENCOUNTER — Ambulatory Visit
Admission: RE | Admit: 2021-12-01 | Discharge: 2021-12-01 | Disposition: A | Discharge status: Home / Self Care | Payer: Federal, State, Local not specified - PPO | Source: Ambulatory Visit | Attending: Radiation Oncology | Admitting: Radiation Oncology

## 2021-12-01 ENCOUNTER — Other Ambulatory Visit: Payer: Self-pay

## 2021-12-01 DIAGNOSIS — C50212 Malignant neoplasm of upper-inner quadrant of left female breast: Secondary | ICD-10-CM | POA: Diagnosis not present

## 2021-12-01 LAB — RAD ONC ARIA SESSION SUMMARY
Course Elapsed Days: 14
Plan Fractions Treated to Date: 3
Plan Prescribed Dose Per Fraction: 5.7 Gy
Plan Total Fractions Prescribed: 5
Plan Total Prescribed Dose: 28.5 Gy
Reference Point Dosage Given to Date: 17.1 Gy
Reference Point Session Dosage Given: 5.7 Gy
Session Number: 3

## 2021-12-08 ENCOUNTER — Other Ambulatory Visit: Payer: Self-pay

## 2021-12-08 ENCOUNTER — Ambulatory Visit
Admission: RE | Admit: 2021-12-08 | Discharge: 2021-12-08 | Disposition: A | Discharge status: Home / Self Care | Payer: Federal, State, Local not specified - PPO | Source: Ambulatory Visit | Attending: Radiation Oncology | Admitting: Radiation Oncology

## 2021-12-08 DIAGNOSIS — C50212 Malignant neoplasm of upper-inner quadrant of left female breast: Secondary | ICD-10-CM | POA: Diagnosis not present

## 2021-12-08 LAB — RAD ONC ARIA SESSION SUMMARY
Course Elapsed Days: 21
Plan Fractions Treated to Date: 4
Plan Prescribed Dose Per Fraction: 5.7 Gy
Plan Total Fractions Prescribed: 5
Plan Total Prescribed Dose: 28.5 Gy
Reference Point Dosage Given to Date: 22.8 Gy
Reference Point Session Dosage Given: 5.7 Gy
Session Number: 4

## 2021-12-09 NOTE — Progress Notes (Signed)
 Patient Care Team: Smith, Candace, MD as PCP - General (Family Medicine) Stuart, Dawn C, RN as Oncology Nurse Navigator Martini, Keisha N, RN as Oncology Nurse Navigator Toth, Paul III, MD as Consulting Physician (General Surgery) Gudena, Vinay, MD as Consulting Physician (Hematology and Oncology) Moody, John, MD as Consulting Physician (Radiation Oncology)  DIAGNOSIS: No diagnosis found.  SUMMARY OF ONCOLOGIC HISTORY: Oncology History  Malignant neoplasm of upper-inner quadrant of left breast in female, estrogen receptor positive (HCC)  07/18/2021 Initial Diagnosis   Screening mammogram detected mass lower medial quadrant, 2 adjacent masses 1 cm and 0.9 cm (total 1.9 cm) at 9 o'clock position: Biopsy: Grade 2-3 IDC with DCIS, ER 50%, PR 50%, HER2 equivocal, FISH pending, Ki-67 1%   07/20/2021 Cancer Staging   Staging form: Breast, AJCC 8th Edition - Clinical stage from 07/20/2021: Stage IA (cT1b, cN0, cM0, G3, ER+, PR+, HER2-) - Signed by Perkins, Alison Claire, PA-C on 07/20/2021 Stage prefix: Initial diagnosis Method of lymph node assessment: Clinical Histologic grading system: 3 grade system     CHIEF COMPLIANT: Follow-up after surgery to discuss final pathology report.    INTERVAL HISTORY: Tara Hardin is a 80 y.o. female is here because of recent diagnosis of mass of the left breast. She presents to the clinic today for a follow-up.   ALLERGIES:  has No Known Allergies.  MEDICATIONS:  Current Outpatient Medications  Medication Sig Dispense Refill   Biotin 1 MG CAPS Take by mouth.     Multiple Vitamin (MULTIVITAMIN) tablet Take 1 tablet by mouth daily.     Omega-3 Fatty Acids (FISH OIL) 1000 MG CAPS Take by mouth.     OVER THE COUNTER MEDICATION Take 5,000 mcg by mouth. Garlinase     oxyCODONE (ROXICODONE) 5 MG immediate release tablet Take 1 tablet (5 mg total) by mouth every 6 (six) hours as needed for severe pain. (Patient not taking: Reported on 11/02/2021) 10 tablet 0    Progesterone Micronized (PROGESTERONE PO) Take by mouth.     Red Yeast Rice Extract (RED YEAST RICE PO) Take by mouth.     triamcinolone ointment (KENALOG) 0.1 % Apply 1 application topically 2 (two) times daily.     No current facility-administered medications for this visit.    PHYSICAL EXAMINATION: ECOG PERFORMANCE STATUS: {CHL ONC ECOG PS:[id number]}  There were no vitals filed for this visit. There were no vitals filed for this visit.  BREAST:*** No palpable masses or nodules in either right or left breasts. No palpable axillary supraclavicular or infraclavicular adenopathy no breast tenderness or nipple discharge. (exam performed in the presence of a chaperone)  LABORATORY DATA:  I have reviewed the data as listed    Latest Ref Rng & Units 07/20/2021    1:06 PM  CMP  Glucose 70 - 99 mg/dL 96   BUN 8 - 23 mg/dL 14   Creatinine 0.44 - 1.00 mg/dL 0.82   Sodium 135 - 145 mmol/L 142   Potassium 3.5 - 5.1 mmol/L 4.0   Chloride 98 - 111 mmol/L 106   CO2 22 - 32 mmol/L 29   Calcium 8.9 - 10.3 mg/dL 9.3   Total Protein 6.5 - 8.1 g/dL 6.8   Total Bilirubin 0.3 - 1.2 mg/dL 0.5   Alkaline Phos 38 - 126 U/L 79   AST 15 - 41 U/L 24   ALT 0 - 44 U/L 18     Lab Results  Component Value Date   WBC 6.1 07/20/2021   HGB   12.1 07/20/2021   HCT 37.4 07/20/2021   MCV 94.0 07/20/2021   PLT 226 07/20/2021   NEUTROABS 3.9 07/20/2021    ASSESSMENT & PLAN:  No problem-specific Assessment & Plan notes found for this encounter.    No orders of the defined types were placed in this encounter.  The patient has a good understanding of the overall plan. she agrees with it. she will call with any problems that may develop before the next visit here. Total time spent: 30 mins including face to face time and time spent for planning, charting and co-ordination of care   Tara Hardin, [place] 12/09/21    I Gardiner Coins am scribing for Dr. Lindi Adie  ***

## 2021-12-15 ENCOUNTER — Ambulatory Visit: Payer: Federal, State, Local not specified - PPO

## 2021-12-15 ENCOUNTER — Encounter: Payer: Self-pay | Admitting: Radiation Oncology

## 2021-12-15 ENCOUNTER — Ambulatory Visit
Admission: RE | Admit: 2021-12-15 | Discharge: 2021-12-15 | Disposition: A | Discharge status: Home / Self Care | Payer: Federal, State, Local not specified - PPO | Source: Ambulatory Visit | Attending: Radiation Oncology | Admitting: Radiation Oncology

## 2021-12-15 ENCOUNTER — Encounter: Payer: Self-pay | Admitting: *Deleted

## 2021-12-15 ENCOUNTER — Inpatient Hospital Stay (HOSPITAL_BASED_OUTPATIENT_CLINIC_OR_DEPARTMENT_OTHER): Payer: Federal, State, Local not specified - PPO | Admitting: Hematology and Oncology

## 2021-12-15 ENCOUNTER — Other Ambulatory Visit: Payer: Self-pay

## 2021-12-15 VITALS — BP 128/50 | HR 74 | Temp 97.5°F | Resp 18 | Ht 64.0 in | Wt 119.8 lb

## 2021-12-15 DIAGNOSIS — Z170 Estrogen receptor positive status [ER+]: Secondary | ICD-10-CM | POA: Insufficient documentation

## 2021-12-15 DIAGNOSIS — Z923 Personal history of irradiation: Secondary | ICD-10-CM | POA: Insufficient documentation

## 2021-12-15 DIAGNOSIS — Z79811 Long term (current) use of aromatase inhibitors: Secondary | ICD-10-CM | POA: Insufficient documentation

## 2021-12-15 DIAGNOSIS — C50212 Malignant neoplasm of upper-inner quadrant of left female breast: Secondary | ICD-10-CM

## 2021-12-15 LAB — RAD ONC ARIA SESSION SUMMARY
Course Elapsed Days: 28
Plan Fractions Treated to Date: 5
Plan Prescribed Dose Per Fraction: 5.7 Gy
Plan Total Fractions Prescribed: 5
Plan Total Prescribed Dose: 28.5 Gy
Reference Point Dosage Given to Date: 28.5 Gy
Reference Point Session Dosage Given: 5.7 Gy
Session Number: 5

## 2021-12-15 MED ORDER — LETROZOLE 2.5 MG PO TABS: 2.5000 mg | ORAL_TABLET | Freq: Every day | ORAL | 3 refills | Status: DC

## 2021-12-15 NOTE — Assessment & Plan Note (Signed)
08/11/2021:Left lumpectomy: Grade 2 IDC 2 cm with intermediate grade DCIS, superior margin close of 0.1 cm, DCIS focally involves posterior and inferior margins ER 50%, PR 50%, HER2 negative, Ki-67 1% margin reexcision 10/07/2021: Benign  11/24/2021-12/15/2021: Adjuvant radiation  Treatment plan: Adjuvant antiestrogen therapy with letrozole 2.5 mg daily x5 years Letrozole counseling: We discussed the risks and benefits of anti-estrogen therapy with aromatase inhibitors. These include but not limited to insomnia, hot flashes, mood changes, vaginal dryness, bone density loss, and weight gain. We strongly believe that the benefits far outweigh the risks. Patient understands these risks and consented to starting treatment. Planned treatment duration is 5 years.  Return to clinic in 3 months for survivorship care plan visit

## 2021-12-16 ENCOUNTER — Telehealth: Payer: Self-pay | Admitting: Hematology and Oncology

## 2021-12-16 NOTE — Telephone Encounter (Signed)
Scheduled appointment per 8/3 los. Left voicemail.

## 2021-12-26 ENCOUNTER — Encounter: Payer: Self-pay | Admitting: *Deleted

## 2021-12-26 NOTE — Progress Notes (Signed)
                                                                                                                                                             Patient Name: Tara Hardin MRN: [id number] DOB: 07/26/1940 Referring Physician: Carol Ada (Profile Not Attached) Date of Service: 12/15/2021  [facility] [place]                                                        End Of Treatment Note  Diagnoses: C50.212-Malignant neoplasm of upper-inner quadrant of left female breast  Cancer Staging: Stage IA, pT2, cN0M0, grade 2  ER/PR positive, invasive ductal carcinoma of the left breast.  Intent: Curative  Radiation Treatment Dates: 11/17/2021 through 12/15/2021 Site Technique Total Dose (Gy) Dose per Fx (Gy) Completed Fx Beam Energies  Breast, Left: Breast_L 3D 28.5/28.5 5.7 5/5 6XFFF   Narrative: The patient tolerated radiation therapy relatively well. She developed fatigue and anticipated skin changes in the treatment field.   Plan: The patient will receive a call in about one month from the radiation oncology department. She will continue follow up with Dr. Lindi Adie as well.   ________________________________________________    Carola Rhine, [facility]

## 2021-12-26 NOTE — Progress Notes (Signed)
RN successfully faxed bone density request to [facility] [phone]).

## 2021-12-28 ENCOUNTER — Encounter: Payer: Self-pay | Admitting: *Deleted

## 2021-12-28 NOTE — Progress Notes (Signed)
RN successfully faxed dental clearance to [facility] [phone]).

## 2022-01-06 ENCOUNTER — Telehealth: Payer: Self-pay | Admitting: *Deleted

## 2022-01-06 NOTE — Telephone Encounter (Signed)
RN placed call to pt regarding recent bone density showing t score -2.2.  pt states she is currently taking calcium 1200 mg daily as well as vitamin D 25 mcg daily.  RN encourage pt to preform weight bearing exercises and continue calcium and vitamin D.  Pt verbalized understanding.

## 2022-01-09 NOTE — Progress Notes (Signed)
  Radiation Oncology         [phone] ________________________________  Name: Tara Hardin MRN: [id number]  Date of Service: 01/17/2022  DOB: [birth year]  Post Treatment Telephone Note  Diagnosis:   Stage IA, pT2, cN0M0, grade 2  ER/PR positive, invasive ductal carcinoma of the left breast.  Intent: Curative  Radiation Treatment Dates:  11/17/2021 through 12/15/2021 Ultrahypofractionated Radiotherapy Site Technique Total Dose (Gy) Dose per Fx (Gy) Completed Fx Beam Energies  Breast, Left: Breast_L 3D 28.5/28.5 5.7 5/5 6XFFF   Narrative: The patient tolerated radiation therapy relatively well. She developed fatigue and anticipated skin changes in the treatment field.   Impression/Plan: 1. Stage IA, pT2, cN0M0, grade 2  ER/PR positive, invasive ductal carcinoma of the left breast.  I was unable to reach the patient but left a voicemail and on the message, I discussed that we would be happy to continue to follow her as needed, but she will also continue to follow up with Dr. Lindi Adie in medical oncology. She was counseled to call if she had questions about skin care or measures to avoid sun exposure to this area.       Carola Rhine, PAC

## 2022-01-17 ENCOUNTER — Ambulatory Visit
Admission: RE | Admit: 2022-01-17 | Discharge: 2022-01-17 | Disposition: A | Discharge status: Home / Self Care | Payer: Federal, State, Local not specified - PPO | Source: Ambulatory Visit | Attending: Radiation Oncology | Admitting: Radiation Oncology

## 2022-01-17 DIAGNOSIS — Z170 Estrogen receptor positive status [ER+]: Secondary | ICD-10-CM | POA: Insufficient documentation

## 2022-01-17 DIAGNOSIS — C50212 Malignant neoplasm of upper-inner quadrant of left female breast: Secondary | ICD-10-CM | POA: Insufficient documentation

## 2022-01-19 ENCOUNTER — Telehealth: Payer: Self-pay

## 2022-01-19 NOTE — Telephone Encounter (Signed)
Called to check on patient. Identity verified. Patient reports mild LT breast/ axilla tenderness 1/10, w/ some redness and pigment change, that is tan in color. Reviewed sun exposure education and precautions at patient request. All patient questions addressed. No other issues reported at this time. I left my extension [phone] in case patient needs anything. Patient verbalized understanding.  SN: Patient does not wish to speak to a nurse. Would like to only speak to Shona Simpson PA-C directly.

## 2022-02-20 ENCOUNTER — Telehealth: Payer: Self-pay | Admitting: *Deleted

## 2022-02-20 NOTE — Telephone Encounter (Signed)
Received VM from pt.  RN attempt x1 to return call.  No answer, unable to LVM due to VM not being set up.

## 2022-03-21 ENCOUNTER — Other Ambulatory Visit (HOSPITAL_BASED_OUTPATIENT_CLINIC_OR_DEPARTMENT_OTHER): Payer: Federal, State, Local not specified - PPO

## 2022-03-21 ENCOUNTER — Inpatient Hospital Stay: Payer: Federal, State, Local not specified - PPO | Attending: Adult Health | Admitting: Adult Health

## 2022-03-23 ENCOUNTER — Other Ambulatory Visit (HOSPITAL_BASED_OUTPATIENT_CLINIC_OR_DEPARTMENT_OTHER): Payer: Federal, State, Local not specified - PPO

## 2022-04-14 ENCOUNTER — Other Ambulatory Visit: Payer: Self-pay

## 2022-04-14 ENCOUNTER — Inpatient Hospital Stay: Payer: Federal, State, Local not specified - PPO | Admitting: Adult Health

## 2022-04-25 ENCOUNTER — Encounter: Payer: Federal, State, Local not specified - PPO | Admitting: Adult Health

## 2022-04-26 ENCOUNTER — Telehealth: Payer: Self-pay | Admitting: Adult Health

## 2022-04-26 NOTE — Telephone Encounter (Signed)
Patient called to see if she could change her upcoming appointment. Informed patient no later times available before original appointment date. Patient stated leave as is.

## 2022-04-28 ENCOUNTER — Ambulatory Visit (HOSPITAL_BASED_OUTPATIENT_CLINIC_OR_DEPARTMENT_OTHER): Admission: RE | Admit: 2022-04-28 | Payer: Federal, State, Local not specified - PPO | Source: Ambulatory Visit

## 2022-05-09 ENCOUNTER — Ambulatory Visit (HOSPITAL_COMMUNITY)
Admission: RE | Admit: 2022-05-09 | Discharge: 2022-05-09 | Disposition: A | Discharge status: Home / Self Care | Payer: Federal, State, Local not specified - PPO | Source: Ambulatory Visit | Attending: Cardiovascular Disease | Admitting: Cardiovascular Disease

## 2022-05-09 DIAGNOSIS — I350 Nonrheumatic aortic (valve) stenosis: Secondary | ICD-10-CM

## 2022-05-09 DIAGNOSIS — E785 Hyperlipidemia, unspecified: Secondary | ICD-10-CM | POA: Diagnosis not present

## 2022-05-09 DIAGNOSIS — I083 Combined rheumatic disorders of mitral, aortic and tricuspid valves: Secondary | ICD-10-CM | POA: Diagnosis not present

## 2022-05-09 LAB — ECHOCARDIOGRAM COMPLETE
AR max vel: 1.09 cm2
AV Area VTI: 0.94 cm2
AV Area mean vel: 0.95 cm2
AV Mean grad: 28.7 mmHg
AV Peak grad: 46.1 mmHg
Ao pk vel: 3.39 m/s
Area-P 1/2: 4.07 cm2
P 1/2 time: 485 msec
S' Lateral: 3.6 cm

## 2022-05-09 NOTE — Progress Notes (Signed)
  Echocardiogram 2D Echocardiogram has been performed.  Tara Hardin 05/09/2022, 2:30 PM

## 2022-05-18 ENCOUNTER — Other Ambulatory Visit (HOSPITAL_BASED_OUTPATIENT_CLINIC_OR_DEPARTMENT_OTHER): Payer: Federal, State, Local not specified - PPO

## 2022-05-18 ENCOUNTER — Encounter: Payer: Federal, State, Local not specified - PPO | Admitting: Adult Health

## 2022-05-18 ENCOUNTER — Other Ambulatory Visit (HOSPITAL_COMMUNITY): Payer: Federal, State, Local not specified - PPO

## 2022-05-19 ENCOUNTER — Inpatient Hospital Stay: Payer: Federal, State, Local not specified - PPO | Attending: Adult Health | Admitting: Adult Health

## 2022-05-19 NOTE — Progress Notes (Signed)
Pt no show to 1/5 Survivorship appt with Mendel Ryder, came 19 min late. Mendel Ryder unavailable to see Pt. Spoke with Pt in lobby who verbalized she did not want to reschedule appt and had no concerns to be addressed. Encouraged Pt to call with any new questions or concerns.

## 2022-06-12 ENCOUNTER — Other Ambulatory Visit: Payer: Self-pay | Admitting: *Deleted

## 2022-06-12 MED ORDER — LETROZOLE 2.5 MG PO TABS: 2.5000 mg | ORAL_TABLET | Freq: Every day | ORAL | 3 refills | Status: DC

## 2022-09-20 ENCOUNTER — Encounter: Payer: Self-pay | Admitting: Hematology and Oncology

## 2022-10-10 ENCOUNTER — Telehealth: Payer: Self-pay

## 2022-10-10 NOTE — Telephone Encounter (Signed)
Pt called to discuss intermittent feet swelling. Pt states she stopped letrozole 2 weeks ago and has not seen improvement. Pt reports she is more active d/t dependent husband. Pt reports she notices slight swelling in bilateral feet typically worse at end of day and better in the AM. Instructed Pt to restart letrozole, avoid salty foods, proper hydration, use of compression stockings, and foot elevation. Pt no show'd to last Surviriorship appt, sent scheduling message to reschedule. Pt verbalized understanding.

## 2022-10-16 ENCOUNTER — Telehealth: Payer: Self-pay | Admitting: Adult Health

## 2022-10-16 NOTE — Telephone Encounter (Signed)
Scheduled appointment per scheduling message. Patient is aware of the made appointment. 

## 2022-10-31 ENCOUNTER — Other Ambulatory Visit: Payer: Self-pay

## 2022-10-31 ENCOUNTER — Inpatient Hospital Stay: Payer: Federal, State, Local not specified - PPO | Attending: Adult Health | Admitting: Adult Health

## 2022-10-31 ENCOUNTER — Telehealth: Payer: Self-pay

## 2022-10-31 NOTE — Telephone Encounter (Signed)
Attempted to call pt regarding appt she [place]/[facility]. No VM set up to leave a message.

## 2022-11-06 ENCOUNTER — Ambulatory Visit: Payer: Federal, State, Local not specified - PPO | Attending: Cardiovascular Disease | Admitting: Cardiovascular Disease

## 2022-11-06 ENCOUNTER — Encounter: Payer: Self-pay | Admitting: Cardiovascular Disease

## 2022-11-06 VITALS — BP 126/76 | HR 79 | Ht 64.0 in | Wt 116.2 lb

## 2022-11-06 DIAGNOSIS — I4891 Unspecified atrial fibrillation: Secondary | ICD-10-CM | POA: Diagnosis not present

## 2022-11-06 DIAGNOSIS — I350 Nonrheumatic aortic (valve) stenosis: Secondary | ICD-10-CM | POA: Diagnosis not present

## 2022-11-06 DIAGNOSIS — I493 Ventricular premature depolarization: Secondary | ICD-10-CM | POA: Diagnosis not present

## 2022-11-06 DIAGNOSIS — I071 Rheumatic tricuspid insufficiency: Secondary | ICD-10-CM | POA: Diagnosis not present

## 2022-11-06 MED ORDER — APIXABAN 2.5 MG PO TABS: 2.5000 mg | ORAL_TABLET | Freq: Two times a day (BID) | ORAL | 0 refills | Status: DC

## 2022-11-06 MED ORDER — APIXABAN 2.5 MG PO TABS: 2.5000 mg | ORAL_TABLET | Freq: Two times a day (BID) | ORAL | 5 refills | Status: DC

## 2022-11-06 NOTE — Patient Instructions (Signed)
Medication Instructions:  Your physician has recommended you make the following change in your medication:  1.) start apixaban (Eliquis) 2.5 mg - one tablet twice a day  *If you need a refill on your cardiac medications before your next appointment, please call your pharmacy*   Lab Work: Today: cbc, bmet If you have labs (blood work) drawn today and your tests are completely normal, you will receive your results only by: MyChart Message (if you have MyChart) OR A paper copy in the mail If you have any lab test that is abnormal or we need to change your treatment, we will call you to review the results.   Testing/Procedures: ECHO DUE IN DECEMBER 2024 Your physician has requested that you have an echocardiogram. Echocardiography is a painless test that uses sound waves to create images of your heart. It provides your doctor with information about the size and shape of your heart and how well your heart's chambers and valves are working. This procedure takes approximately one hour. There are no restrictions for this procedure. Please do NOT wear cologne, perfume, aftershave, or lotions (deodorant is allowed). Please arrive 15 minutes prior to your appointment time.    Follow-Up: At [facility], you and your health needs are our priority.  As part of our continuing mission to provide you with exceptional heart care, we have created designated Provider Care Teams.  These Care Teams include your primary Cardiologist (physician) and Advanced Practice Providers (APPs -  Physician Assistants and Nurse Practitioners) who all work together to provide you with the care you need, when you need it.  We recommend signing up for the patient portal called "MyChart".  Sign up information is provided on this After Visit Summary.  MyChart is used to connect with patients for Virtual Visits (Telemedicine).  Patients are able to view lab/test results, encounter notes, upcoming appointments, etc.   Non-urgent messages can be sent to your provider as well.   To learn more about what you can do with MyChart, go to [email or web address].    Your next appointment:   3 month(s)  Provider:   Advanced Practice Provider (NP or PA-C0

## 2022-11-06 NOTE — Progress Notes (Unsigned)
Chief Complaint  Patient presents with   Follow-up    Aortic stenosis    History of Present Illness:82 yo Hardin with history of breast cancer, hyperlipidemia, aortic stenosis and tricuspid valve regurgitation here today for follow up. I met her in July 2022.  She had been evaluated in March 2022 by Dr. Bing Matter. Echo in outside facility in October 2021 with mention of moderate to severe TR. I could not see these images. She was asymptomatic when I saw her in July 2022. Echo December 2023 with LVEF=50%. Mild MR. Mild to moderate TR. Moderate to severe AS with mean gradient 28 mmHg, AVA 0.94 cm2, SVI 47, DI 0.25.    She is here today for follow up. The patient denies any chest pain, dyspnea, palpitations, lower extremity edema, orthopnea, PND, dizziness, near syncope or syncope. She has some fatigue from being the constant caregiver for her husband.     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   Nonrheumatic tricuspid (valve) insufficiency 07/21/2020   Prediabetes 08/30/2020    Past Surgical History:  Procedure Laterality Date   BREAST LUMPECTOMY WITH RADIOACTIVE SEED LOCALIZATION Left 08/11/2021   Procedure: LEFT BREAST LUMPECTOMY WITH RADIOACTIVE SEED LOCALIZATION X2;  Surgeon: Griselda Miner, MD;  Location: [facility];  Service: General;  Laterality: Left;   RE-EXCISION OF BREAST CANCER,SUPERIOR MARGINS Left 10/07/2021   Procedure: RE-EXCISION OF LEFT BREAST INFERIOR MARGIN;  Surgeon: Chevis Pretty III, MD;  Location: [facility];  Service:  General;  Laterality: Left;    Current Outpatient Medications  Medication Sig Dispense Refill   apixaban (ELIQUIS) 2.5 MG TABS tablet Take 1 tablet (2.5 mg total) by mouth 2 (two) times daily. 60 tablet 5   apixaban (ELIQUIS) 2.5 MG TABS tablet Take 1 tablet (2.5 mg total) by mouth 2 (two) times daily. 56 tablet 0   Biotin 1 MG CAPS Take by mouth.     letrozole (FEMARA) 2.5 MG tablet Take 1 tablet (2.5 mg total) by mouth daily. 90 tablet 3   Multiple Vitamin (MULTIVITAMIN) tablet Take 1 tablet by mouth daily.     Omega-3 Fatty Acids (FISH OIL) 1000 MG CAPS Take by mouth.     OVER THE COUNTER MEDICATION Take 5,000 mcg by mouth. Garlinase     Red Yeast Rice Extract (RED YEAST RICE PO) Take by mouth.     triamcinolone ointment (KENALOG) 0.1 % Apply 1 application topically 2 (two) times daily.     No current facility-administered medications for this visit.    No Known Allergies  Social History   Socioeconomic History   Marital status: Married    Spouse name: Not on file   Number of children: 3   Years of education: Not on file   Highest education level: Not on file  Occupational History   Occupation: Retired Information systems manager  Tobacco Use   Smoking status: Never   Smokeless tobacco: Never  Vaping Use   Vaping Use: Never used  Substance and Sexual Activity   Alcohol use: Not Currently  Drug use: Never   Sexual activity: Not Currently    Birth control/protection: Post-menopausal  Other Topics Concern   Not on file  Social History Narrative   Not on file   Social Determinants of Health   Financial Resource Strain: Not on file  Food Insecurity: Not on file  Transportation Needs: Not on file  Physical Activity: Not on file  Stress: Not on file  Social Connections: Not on file  Intimate Partner Violence: Not on file    Family History  Problem Relation Age of Onset   Lung cancer Mother        d. 72   Heart attack Sister    Hypertension Sister    Atrial fibrillation  Brother    Hypertension Brother    Heart attack Brother    Irregular heart beat Brother    Breast cancer Paternal Aunt        dx after 10   Breast cancer Cousin        paternal Hardin cousin    Review of Systems:  As stated in the HPI and otherwise negative.   BP 126/76   Pulse 79   Ht 5\' 4"  (1.626 m)   Wt 52.7 kg   SpO2 97%   BMI 19.95 kg/m   Physical Examination: General: Well developed, well nourished, NAD  HEENT: OP clear, mucus membranes moist  SKIN: warm, dry. No rashes. Neuro: No focal deficits  Musculoskeletal: Muscle strength 5/5 all ext  Psychiatric: Mood and affect normal  Neck: No JVD, no carotid bruits, no thyromegaly, no lymphadenopathy.  Lungs:Clear bilaterally, no wheezes, rhonci, crackles Cardiovascular: Irreg irreg. Harsh systolic murmur.  Abdomen:Soft. Bowel sounds present. Non-tender.  Extremities: No lower extremity edema. Pulses are 2 + in the bilateral DP/PT.   EKG:  EKG is ordered today. The ekg ordered today demonstrates  EKG Interpretation  Date/Time:  Monday November 06 2022 16:22:30 EDT Ventricular Rate:  79 PR Interval:    QRS Duration: 150 QT Interval:  428 QTC Calculation: 490 R Axis:   -84 Text Interpretation: Atrial fibrillation Left axis deviation Left bundle branch block No previous ECGs available Confirmed by Verne Carrow [id number]) on 11/06/2022 4:30:36 PM   Echo 04/04/21:   1. Left ventricular ejection fraction, by estimation, is 45 to 50%. Left  ventricular ejection fraction by 3D volume is 45 %. The left ventricle has  mildly decreased function. The left ventricle demonstrates global  hypokinesis. There is mild asymmetric  left ventricular hypertrophy of the basal-septal segment. Left ventricular  diastolic parameters are consistent with Grade I diastolic dysfunction  (impaired relaxation).   2. Right ventricular systolic function is normal. The right ventricular  size is mildly enlarged. There is normal pulmonary artery  systolic  pressure.   3. Left atrial size was moderately dilated.   4. Right atrial size was moderately dilated.   5. The mitral valve is degenerative. Mild mitral valve regurgitation. No  evidence of mitral stenosis.   6. Tricuspid valve regurgitation is mild to moderate.   7. The aortic valve is calcified. Aortic valve regurgitation is mild.  Moderate to severe aortic valve stenosis. Aortic valve area, by VTI  measures 0.94 cm. Aortic valve mean gradient measures 28.7 mmHg. Aortic  valve Vmax measures 3.39 m/s.   8. Abdominal aorta is normal sized.   9. The inferior vena cava is dilated in size with >50% respiratory  variability, suggesting right atrial pressure of 8 mmHg.   Recent Labs: 11/06/2022: BUN 21; Creatinine,  Ser 1.05; Hemoglobin 12.1; Platelets 197; Potassium 4.7; Sodium 142   Lipid Panel No results found for: "CHOL", "TRIG", "HDL", "CHOLHDL", "VLDL", "LDLCALC", "LDLDIRECT"   Wt Readings from Last 3 Encounters:  11/06/22 52.7 kg  12/15/21 54.3 kg  11/02/21 52.7 kg    Assessment and Plan:   1. Tricuspid regurgitation: Mild to moderate by echo in December 2023.   2. Aortic stenosis: Moderately severe by echo December 2023. No symptoms. Will plan to repeat her echo in December 2024.   3. PVCs/PACs: No palpitations  4. New onset atrial fibrillation: She is not aware of any irregularity of her heart. First time that atrial fib has been noted. Heart rate is ok. CHADS VASC score 3. Will start Eliquis 2.5 mg po BID. (Age Tara, 52 kg). BMET and CBC today  Labs/ tests ordered today include:   Orders Placed This Encounter  Procedures   Basic metabolic panel   CBC   EKG 12-Lead   ECHOCARDIOGRAM COMPLETE    Disposition:   F/U with me in 12 months.    Signed, Verne Carrow, MD 11/07/2022 10:13 AM    [facility] [address] [place], [place], [place]  [address] Phone: [phone]; Fax: [phone]

## 2022-11-07 LAB — CBC
Hematocrit: 36.8 % (ref 34.0–46.6)
Hemoglobin: 12.1 g/dL (ref 11.1–15.9)
MCH: 30.9 pg (ref 26.6–33.0)
MCHC: 32.9 g/dL (ref 31.5–35.7)
MCV: 94 fL (ref 79–97)
Platelets: 197 10*3/uL (ref 150–450)
RBC: 3.91 x10E6/uL (ref 3.77–5.28)
RDW: 13.3 % (ref 11.7–15.4)
WBC: 7.6 10*3/uL (ref 3.4–10.8)

## 2022-11-07 LAB — BASIC METABOLIC PANEL
BUN/Creatinine Ratio: 20 (ref 12–28)
BUN: 21 mg/dL (ref 8–27)
CO2: 23 mmol/L (ref 20–29)
Calcium: 9.4 mg/dL (ref 8.7–10.3)
Chloride: 105 mmol/L (ref 96–106)
Creatinine, Ser: 1.05 mg/dL — ABNORMAL HIGH (ref 0.57–1.00)
Glucose: 69 mg/dL — ABNORMAL LOW (ref 70–99)
Potassium: 4.7 mmol/L (ref 3.5–5.2)
Sodium: 142 mmol/L (ref 134–144)
eGFR: 54 mL/min/{1.73_m2} — ABNORMAL LOW (ref >59)

## 2022-11-13 ENCOUNTER — Encounter: Payer: Self-pay | Admitting: Adult Health

## 2022-11-13 ENCOUNTER — Inpatient Hospital Stay: Payer: Federal, State, Local not specified - PPO | Attending: Adult Health | Admitting: Adult Health

## 2022-11-13 DIAGNOSIS — Z170 Estrogen receptor positive status [ER+]: Secondary | ICD-10-CM

## 2022-11-13 DIAGNOSIS — C50212 Malignant neoplasm of upper-inner quadrant of left female breast: Secondary | ICD-10-CM | POA: Diagnosis not present

## 2022-11-13 NOTE — Assessment & Plan Note (Signed)
Tara Hardin is an 82 year old woman with stage IA left breast invasive ductal carcinoma, ER/PR+, diagnosed in 07/2021 s/p lumpectomy, adjuvant radiation, and antiestrogen therapy with Letrozole that began in 12/2021.    Left breast cancer: She has no clinical or radiographic signs of breast cancer recurrence.  She will continue on letrozole as she is tolerating it well.  I sent Dr. Billey Chang office a message to coordinate follow-up with them in January 2025. Bone health: her most recent bone density was completed on January 05, 2022 demonstrating osteopenia with a T-score -2.2 in the left femoral neck.  She is optimizing her calcium, vitamin D, and weightbearing exercises.  Repeat testing is due in August 2025. Health maintenance: I suggested she continue to follow-up with her primary care provider and maintain healthy diet and activity.  She was prescribed Eliquis by her cardiologist and was considering not starting it until after her dermatology appointment.  I suggested that she call her cardiologist office to talk to them about this further.  We will see Tara Hardin back in a year for f/u.

## 2022-11-13 NOTE — Progress Notes (Signed)
[facility] Cancer Follow up:    Tara Brunette, MD [phone] [address] [place] [address]   DIAGNOSIS:  Cancer Staging  Malignant neoplasm of upper-inner quadrant of left breast in female, estrogen receptor positive (HCC) Staging form: Breast, AJCC 8th Edition - Clinical stage from 07/20/2021: Stage IA (cT1b, cN0, cM0, G3, ER+, PR+, HER2-) - Signed by Ronny Bacon, PA-C on 07/20/2021 Stage prefix: Initial diagnosis Method of lymph node assessment: Clinical Histologic grading system: 3 grade system - Pathologic: Stage IA (pT2, pN0, cM0, G2, ER+, PR+, HER2-) - Unsigned Stage prefix: Initial diagnosis Method of lymph node assessment: Clinical Histologic grading system: 3 grade system  I connected with Tara Hardin on 11/13/22 at  3:45 PM EDT by telephone and verified that I am speaking with the correct person using two identifiers.I discussed the limitations, risks, security and privacy concerns of performing an evaluation and management service by telephone and the availability of in person appointments.  I also discussed with the patient that there may be a patient responsible charge related to this service. The patient expressed understanding and agreed to proceed.  Patient location: home Provider location: [facility] office  SUMMARY OF ONCOLOGIC HISTORY: Oncology History  Malignant neoplasm of upper-inner quadrant of left breast in female, estrogen receptor positive (HCC)  07/18/2021 Initial Diagnosis   Screening mammogram detected mass lower medial quadrant, 2 adjacent masses 1 cm and 0.9 cm (total 1.9 cm) at 9 o'clock position: Biopsy: Grade 2-3 IDC with DCIS, ER 50%, PR 50%, HER2 equivocal, FISH pending, Ki-67 1%   07/20/2021 Cancer Staging   Staging form: Breast, AJCC 8th Edition - Clinical stage from 07/20/2021: Stage IA (cT1b, cN0, cM0, G3, ER+, PR+, HER2-) - Signed by Ronny Bacon, PA-C on 07/20/2021 Stage prefix: Initial diagnosis Method of  lymph node assessment: Clinical Histologic grading system: 3 grade system   08/11/2021 Surgery   Left lumpectomy: Grade 2 IDC 2 cm with intermediate grade DCIS, superior margin close of 0.1 cm, DCIS focally involves posterior and inferior margins ER 50%, PR 50%, HER2 negative, Ki-67 1%    10/07/2021 Surgery   Margin reexcision left inferior: Benign   11/24/2021 - 12/15/2021 Radiation Therapy   Adjuvant radiation   12/15/2021 -  Anti-estrogen oral therapy   2.5 mg Letrozole x 5 years     CURRENT THERAPY: Letrozole daily  INTERVAL HISTORY: Tara Hardin 82 y.o. female returns for f/u of her h/o breast cancer.  She has some stiffness in her shoulders and does ROM exercises that helps.  She has occasional arthralgias.  She is taking letrozole daily.  She has some occasional hot flashes.  She denies vaginal dryness.  She says her hot flashes are tolerable.    Her most recent bilateral breast mammogram occurred on 09/19/2022 and showed no mammographic evidence of malignancy, and breast density category C. She went to the heart doctor and was prescribed Eliquis.  She hasn't yet started, but wants my opinion on it today.     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   Nonrheumatic tricuspid (valve) insufficiency 07/21/2020   Prediabetes 08/30/2020    SURGICAL HISTORY: Past Surgical History:  Procedure Laterality Date   BREAST LUMPECTOMY WITH RADIOACTIVE SEED LOCALIZATION Left 08/11/2021   Procedure: LEFT BREAST LUMPECTOMY WITH RADIOACTIVE SEED LOCALIZATION X2;  Surgeon: Griselda Miner, MD;  Location: [facility];  Service: General;  Laterality: Left;   RE-EXCISION OF BREAST CANCER,SUPERIOR MARGINS Left 10/07/2021   Procedure: RE-EXCISION OF LEFT BREAST INFERIOR MARGIN;  Surgeon: Griselda Miner, MD;  Location: [facility];  Service: General;  Laterality: Left;    SOCIAL HISTORY: Social History   Socioeconomic History   Marital status: Married    Spouse name: Not on file   Number of children: 3   Years of education: Not on file   Highest education level: Not on file  Occupational History   Occupation: Retired Information systems manager  Tobacco Use   Smoking status: Never   Smokeless tobacco: Never  Vaping Use   Vaping Use: Never used  Substance and Sexual Activity   Alcohol use: Not Currently   Drug use: Never   Sexual activity: Not Currently    Birth control/protection: Post-menopausal  Other Topics Concern   Not on file  Social History Narrative   Not on file   Social Determinants of Health   Financial Resource Strain: Not on file  Food Insecurity: Not on file  Transportation Needs: Not on file  Physical Activity: Not on file  Stress: Not on file  Social Connections: Not on file  Intimate Partner Violence: Not on file    FAMILY HISTORY: Family History  Problem Relation Age of Onset   Lung cancer Mother        d. 23   Heart attack Sister    Hypertension Sister    Atrial  fibrillation Brother    Hypertension Brother    Heart attack Brother    Irregular heart beat Brother    Breast cancer Paternal Aunt        dx after 69   Breast cancer Cousin        paternal female cousin    Review of Systems  Constitutional:  Negative for appetite change, chills, fatigue, fever and unexpected weight change.  HENT:   Negative for hearing loss, lump/mass and trouble swallowing.   Eyes:  Negative for eye problems and icterus.  Respiratory:  Negative for chest tightness, cough and shortness of breath.   Cardiovascular:  Negative for chest pain, leg swelling and palpitations.  Gastrointestinal:  Negative for abdominal distention, abdominal pain, constipation, diarrhea, nausea and vomiting.  Endocrine: Positive for hot flashes.  Genitourinary:  Negative for difficulty urinating.   Musculoskeletal:  Positive for arthralgias.  Skin:  Negative for itching and rash.  Neurological:  Negative for dizziness, extremity weakness, headaches and numbness.  Hematological:  Negative for adenopathy. Does not bruise/bleed easily.  Psychiatric/Behavioral:  Negative for depression. The patient is not nervous/anxious.  PHYSICAL EXAMINATION Patient sounds well.  She is in no apparent distress, her mood and behavior are normal.     ASSESSMENT and THERAPY PLAN:   Malignant neoplasm of upper-inner quadrant of left breast in female, estrogen receptor positive (HCC) Tara Hardin is an 82 year old woman with stage IA left breast invasive ductal carcinoma, ER/PR+, diagnosed in 07/2021 s/p lumpectomy, adjuvant radiation, and antiestrogen therapy with Letrozole that began in 12/2021.    Left breast cancer: She has no clinical or radiographic signs of breast cancer recurrence.  She will continue on letrozole as she is tolerating it well.  I sent Dr. Billey Chang office a message to coordinate follow-up with them in January 2025. Bone health: her most recent bone density was completed on January 05, 2022  demonstrating osteopenia with a T-score -2.2 in the left femoral neck.  She is optimizing her calcium, vitamin D, and weightbearing exercises.  Repeat testing is due in August 2025. Health maintenance: I suggested she continue to follow-up with her primary care provider and maintain healthy diet and activity.  She was prescribed Eliquis by her cardiologist and was considering not starting it until after her dermatology appointment.  I suggested that she call her cardiologist office to talk to them about this further.  We will see Tara Hardin back in a year for f/u.     Follow up instructions:    -Return to cancer center in one year for f/u with Dr. Pamelia Hoit -Mammogram due in 09/2023 -Bone density 12/2023 -Follow up with Dr. Carolynne Edouard at [facility] 6 months   The patient was provided an opportunity to ask questions and all were answered. The patient agreed with the plan and demonstrated an understanding of the instructions.   The patient was advised to call back or seek an in-person evaluation if the symptoms worsen or if the condition fails to improve as anticipated.   I provided 15 minutes of non face-to-face telephone visit time during this encounter, and > 50% was spent counseling as documented under my assessment & plan.    Lillard Anes, NP 11/13/22 4:18 PM Medical Oncology and Hematology [facility] [address] [place], [place] [address] Tel. [phone]    Fax. [phone]

## 2022-12-28 ENCOUNTER — Ambulatory Visit: Payer: Federal, State, Local not specified - PPO | Admitting: Cardiovascular Disease

## 2023-02-20 ENCOUNTER — Ambulatory Visit: Payer: Federal, State, Local not specified - PPO | Admitting: Cardiovascular Disease

## 2023-02-20 NOTE — Progress Notes (Deleted)
No chief complaint on file.  History of Present Illness: 82 yo female with history of breast cancer, hyperlipidemia, aortic stenosis and tricuspid valve regurgitation here today for follow up. I met her in July 2022.  She had been evaluated in March 2022 by Dr. Bing Matter. Echo in outside facility in October 2021 with mention of moderate to severe TR. I could not see these images. She was asymptomatic when I saw her in July 2022. Echo December 2023 with LVEF=50%. Mild MR. Mild to moderate TR. Moderate to severe AS with mean gradient 28 mmHg, AVA 0.94 cm2, SVI 47, DI 0.25.  Plan was to follow her moderate AS with repeat echo in one year. I saw her in June 2024 and she was doing well. She was seen in primary care last week and reported dyspnea and LE edema. BNP over 2000. She was started on Lasix and potassium.   She is here today for follow up. The patient denies any chest pain, palpitations, orthopnea, PND, dizziness, near syncope or syncope. 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   Nonrheumatic tricuspid (valve) insufficiency 07/21/2020   Prediabetes 08/30/2020    Past Surgical History:  Procedure Laterality Date   BREAST LUMPECTOMY WITH RADIOACTIVE SEED LOCALIZATION Left 08/11/2021   Procedure: LEFT BREAST LUMPECTOMY WITH RADIOACTIVE SEED LOCALIZATION X2;  Surgeon: Griselda Miner, MD;  Location: [facility];  Service: General;  Laterality: Left;   RE-EXCISION OF BREAST CANCER,SUPERIOR MARGINS Left 10/07/2021   Procedure:  RE-EXCISION OF LEFT BREAST INFERIOR MARGIN;  Surgeon: Chevis Pretty III, MD;  Location:  SURGERY CENTER;  Service: General;  Laterality: Left;    Current Outpatient Medications  Medication Sig Dispense Refill   apixaban (ELIQUIS) 2.5 MG TABS tablet Take 1 tablet (2.5 mg total) by mouth 2 (two) times daily. 60 tablet 5   apixaban (ELIQUIS) 2.5 MG TABS tablet Take 1 tablet (2.5 mg total) by mouth 2 (two) times daily. 56 tablet 0   Biotin 1 MG CAPS Take by mouth.     letrozole (FEMARA) 2.5 MG tablet Take 1 tablet (2.5 mg total) by mouth daily. 90 tablet 3   Multiple Vitamin (MULTIVITAMIN) tablet Take 1 tablet by mouth daily.     Omega-3 Fatty Acids (FISH OIL) 1000 MG CAPS Take by mouth.     OVER THE COUNTER MEDICATION Take 5,000 mcg by mouth. Garlinase     Red Yeast Rice Extract (RED YEAST RICE PO) Take by mouth.     triamcinolone ointment (KENALOG) 0.1 % Apply 1 application topically 2 (two) times daily.     No current facility-administered medications for this visit.    No Known Allergies  Social History   Socioeconomic History   Marital status: Married    Spouse name: Not on file   Number of children: 3   Years of education: Not on file   Highest education level: Not on file  Occupational History   Occupation: Retired Information systems manager  Tobacco Use  Smoking status: Never   Smokeless tobacco: Never  Vaping Use   Vaping status: Never Used  Substance and Sexual Activity   Alcohol use: Not Currently   Drug use: Never   Sexual activity: Not Currently    Birth control/protection: Post-menopausal  Other Topics Concern   Not on file  Social History Narrative   Not on file   Social Determinants of Health   Financial Resource Strain: Not on file  Food Insecurity: Not on file  Transportation Needs: Not on file  Physical Activity: Not on file  Stress: Not on file  Social Connections: Not on file  Intimate Partner Violence: Not on file    Family History  Problem  Relation Age of Onset   Lung cancer Mother        d. 1   Heart attack Sister    Hypertension Sister    Atrial fibrillation Brother    Hypertension Brother    Heart attack Brother    Irregular heart beat Brother    Breast cancer Paternal Aunt        dx after 77   Breast cancer Cousin        paternal female cousin    Review of Systems:  As stated in the HPI and otherwise negative.   There were no vitals taken for this visit.  Physical Examination:  General: Well developed, well nourished, NAD  HEENT: OP clear, mucus membranes moist  SKIN: warm, dry. No rashes. Neuro: No focal deficits  Musculoskeletal: Muscle strength 5/5 all ext  Psychiatric: Mood and affect normal  Neck: No JVD, no carotid bruits, no thyromegaly, no lymphadenopathy.  Lungs:Clear bilaterally, no wheezes, rhonci, crackles Cardiovascular: Regular rate and rhythm. *** Harsh systolic murmur.  Abdomen:Soft. Bowel sounds present. Non-tender.  Extremities: *** No lower extremity edema. Pulses are 2 + in the bilateral DP/PT.   EKG:  EKG is *** ordered today. The ekg ordered today demonstrates     Echo December 2023:    1. Left ventricular ejection fraction, by estimation, is 45 to 50%. Left  ventricular ejection fraction by 3D volume is 45 %. The left ventricle has  mildly decreased function. The left ventricle demonstrates global  hypokinesis. There is mild asymmetric  left ventricular hypertrophy of the basal-septal segment. Left ventricular  diastolic parameters are consistent with Grade I diastolic dysfunction  (impaired relaxation).   2. Right ventricular systolic function is normal. The right ventricular  size is mildly enlarged. There is normal pulmonary artery systolic  pressure.   3. Left atrial size was moderately dilated.   4. Right atrial size was moderately dilated.   5. The mitral valve is degenerative. Mild mitral valve regurgitation. No  evidence of mitral stenosis.   6. Tricuspid valve  regurgitation is mild to moderate.   7. The aortic valve is calcified. Aortic valve regurgitation is mild.  Moderate to severe aortic valve stenosis. Aortic valve area, by VTI  measures 0.94 cm. Aortic valve mean gradient measures 28.7 mmHg. Aortic  valve Vmax measures 3.39 m/s.   8. Abdominal aorta is normal sized.   9. The inferior vena cava is dilated in size with >50% respiratory  variability, suggesting right atrial pressure of 8 mmHg.   FINDINGS   Left Ventricle: Left ventricular ejection fraction, by estimation, is 45  to 50%. Left ventricular ejection fraction by 3D volume is 45 %. The left  ventricle has mildly decreased function. The left ventricle demonstrates  global hypokinesis. The left  ventricular internal  cavity size was normal in size. There is mild  asymmetric left ventricular hypertrophy of the basal-septal segment. Left  ventricular diastolic parameters are consistent with Grade I diastolic  dysfunction (impaired relaxation).   Right Ventricle: The right ventricular size is mildly enlarged. No  increase in right ventricular wall thickness. Right ventricular systolic  function is normal. There is normal pulmonary artery systolic pressure.  The tricuspid regurgitant velocity is 2.32   m/s, and with an assumed right atrial pressure of 8 mmHg, the estimated  right ventricular systolic pressure is 29.5 mmHg.   Left Atrium: Left atrial size was moderately dilated.   Right Atrium: Right atrial size was moderately dilated.   Pericardium: There is no evidence of pericardial effusion.   Mitral Valve: The mitral valve is degenerative in appearance. There is  mild thickening of the mitral valve leaflet(s). There is moderate  calcification of the mitral valve leaflet(s). Mild mitral valve  regurgitation. No evidence of mitral valve stenosis.   Tricuspid Valve: The tricuspid valve is grossly normal. Tricuspid valve  regurgitation is mild to moderate. No evidence of  tricuspid stenosis.   Aortic Valve: The aortic valve is calcified. Aortic valve regurgitation is  mild. Aortic regurgitation PHT measures 485 msec. Moderate to severe  aortic stenosis is present. Aortic valve mean gradient measures 28.7 mmHg.  Aortic valve peak gradient measures   46.1 mmHg. Aortic valve area, by VTI measures 0.94 cm.   Pulmonic Valve: The pulmonic valve was grossly normal. Pulmonic valve  regurgitation is mild to moderate. No evidence of pulmonic stenosis.   Aorta: Abdominal aorta is normal sized. The aortic root and ascending  aorta are structurally normal, with no evidence of dilitation.   Venous: The inferior vena cava is dilated in size with greater than 50%  respiratory variability, suggesting right atrial pressure of 8 mmHg.   IAS/Shunts: No atrial level shunt detected by color flow Doppler.     LEFT VENTRICLE  PLAX 2D  LVIDd:         4.70 cm         Diastology  LVIDs:         3.60 cm         LV e' medial:    4.28 cm/s  LV PW:         1.10 cm         LV E/e' medial:  18.0  LV IVS:        1.30 cm         LV e' lateral:   5.33 cm/s  LVOT diam:     2.20 cm         LV E/e' lateral: 14.5  LV SV:         75  LV SV Index:   47  LVOT Area:     3.80 cm        3D Volume EF                                 LV 3D EF:    Left                                              ventricul  ar                                              ejection                                              fraction                                              by 3D                                              volume is                                              45 %.                                   3D Volume EF:                                 3D EF:        45 %                                 LV EDV:       139 ml                                 LV ESV:       76 ml                                 LV SV:        63 ml   RIGHT VENTRICLE  RV  Basal diam:  4.50 cm  RV Mid diam:    2.80 cm  RV S prime:     18.30 cm/s  TAPSE (M-mode): 2.3 cm   LEFT ATRIUM             Index        RIGHT ATRIUM           Index  LA diam:        3.80 cm 2.42 cm/m   RA Area:     20.20 cm  LA Vol (A2C):   56.2 ml 35.72 ml/m  RA Volume:   58.50 ml  37.18 ml/m  LA Vol (A4C):   87.7 ml 55.74 ml/m  LA Biplane Vol: 70.9 ml 45.06 ml/m   AORTIC VALVE  PULMONIC VALVE  AV Area (Vmax):    1.09 cm      PR End Diast Vel: 3.93 msec  AV Area (Vmean):   0.95 cm  AV Area (VTI):     0.94 cm  AV Vmax:           339.33 cm/s  AV Vmean:          253.333 cm/s  AV VTI:            0.793 m  AV Peak Grad:      46.1 mmHg  AV Mean Grad:      28.7 mmHg  LVOT Vmax:         97.50 cm/s  LVOT Vmean:        63.000 cm/s  LVOT VTI:          0.196 m  LVOT/AV VTI ratio: 0.25  AI PHT:            485 msec    AORTA  Ao Root diam: 3.33 cm  Ao Asc diam:  3.30 cm   MITRAL VALVE               TRICUSPID VALVE  MV Area (PHT): 4.07 cm    TR Peak grad:   21.5 mmHg  MV Decel Time: 187 msec    TR Vmax:        232.00 cm/s  MV E velocity: 77.10 cm/s  MV A velocity: 67.30 cm/s  SHUNTS  MV E/A ratio:  1.15        Systemic VTI:  0.20 m                             Systemic Diam: 2.20 cm    Recent Labs: 11/06/2022: BUN 21; Creatinine, Ser 1.05; Hemoglobin 12.1; Platelets 197; Potassium 4.7; Sodium 142   Lipid Panel No results found for: "CHOL", "TRIG", "HDL", "CHOLHDL", "VLDL", "LDLCALC", "LDLDIRECT"   Wt Readings from Last 3 Encounters:  11/06/22 52.7 kg  12/15/21 54.3 kg  11/02/21 52.7 kg    Assessment and Plan:   1. Tricuspid regurgitation: Mild to moderate by echo in December 2023.   2. Aortic stenosis: Moderately severe by echo December 2023. She is now having issues with volume retention. Will plan to repeat her echo now.   3. Acute systolic CHF: ***  4. PVCs/PACs: No palpitations.   5. Atrial fibrillation, paroxysmal: *** Sinus today. CHADS  VASC score 3. Continue Eliquis.   Labs/ tests ordered today include:   No orders of the defined types were placed in this encounter.   Disposition:   F/U with me in *** months.    Signed, Verne Carrow, MD 02/20/2023 11:11 AM    [facility] [address] [place], [place], [place]  [address] Phone: [phone]; Fax: [phone]

## 2023-02-28 ENCOUNTER — Encounter: Payer: Self-pay | Admitting: Cardiovascular Disease

## 2023-02-28 ENCOUNTER — Ambulatory Visit: Payer: Federal, State, Local not specified - PPO | Attending: Physician Assistant | Admitting: Cardiovascular Disease

## 2023-02-28 VITALS — BP 112/60 | HR 89 | Ht 64.0 in | Wt 112.2 lb

## 2023-02-28 DIAGNOSIS — I480 Paroxysmal atrial fibrillation: Secondary | ICD-10-CM

## 2023-02-28 DIAGNOSIS — I350 Nonrheumatic aortic (valve) stenosis: Secondary | ICD-10-CM | POA: Diagnosis not present

## 2023-02-28 DIAGNOSIS — I5033 Acute on chronic diastolic (congestive) heart failure: Secondary | ICD-10-CM | POA: Diagnosis not present

## 2023-02-28 MED ORDER — FUROSEMIDE 40 MG PO TABS: 40.0000 mg | ORAL_TABLET | Freq: Every day | ORAL | 3 refills | Status: DC

## 2023-02-28 NOTE — Patient Instructions (Signed)
Medication Instructions:  Your physician has recommended you make the following change in your medication:  1.) increase furosemide (Lasix) to 40 mg - once daily  *If you need a refill on your cardiac medications before your next appointment, please call your pharmacy*   Lab Work: Today: bmet If you have labs (blood work) drawn today and your tests are completely normal, you will receive your results only by: MyChart Message (if you have MyChart) OR A paper copy in the mail If you have any lab test that is abnormal or we need to change your treatment, we will call you to review the results.   Testing/Procedures: Please move echo up to sometime in the next couple weeks   Follow-Up: Wed Nov 27 at 2:40 pm with Dr. Clifton James

## 2023-02-28 NOTE — Progress Notes (Signed)
Chief Complaint  Patient presents with   Follow-up    Aortic stenosis, acute CHF   History of Present Illness: 82 yo female with history of atrial fibrillation, breast cancer, hyperlipidemia, aortic stenosis and tricuspid valve regurgitation here today for follow up. I met her in July 2022.  She had been evaluated in March 2022 by Dr. Bing Matter. Echo in outside facility in October 2021 with mention of moderate to severe TR. I could not see these images. She was asymptomatic when I saw her in July 2022. Echo December 2023 with LVEF=50%. Mild MR. Mild to moderate TR. Moderate to severe AS with mean gradient 28 mmHg, AVA 0.94 cm2, SVI 47, DI 0.25.  Plan was to follow her moderate AS with repeat echo in one year. I saw her in June 2024 and she was doing well. She was found to be in atrial fibrillation at her visit in June 2024 and was started on Eliquis. She was seen in primary care two weeks ago and reported dyspnea and LE edema. BNP over 2000. She was started on Lasix and potassium. I had planned on seeing her last week in our office but she arrived 35 minutes late for her appointment and we rescheduled her.   She is here today for follow up. The patient denies any chest pain, palpitations, orthopnea, PND, dizziness, near syncope or syncope. She tells me today that her breathing is better since starting Lasix two weeks ago.  She has been taking Lasix 20 mg every day. She still has some LE edema.     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;  Service: General;  Laterality: Left;    Current Outpatient Medications  Medication Sig Dispense Refill   furosemide (LASIX) 40 MG tablet Take 1 tablet (40 mg total) by mouth daily. 90 tablet 3   KLOR-CON 10 10 MEQ tablet Take 10 mEq by mouth 2 (two) times daily.     apixaban (ELIQUIS) 2.5 MG TABS tablet Take 1 tablet (2.5 mg total) by mouth 2 (two) times daily. 60 tablet 5   Biotin 1 MG CAPS Take by mouth.     letrozole (FEMARA) 2.5 MG tablet Take 1 tablet (2.5 mg total) by mouth daily. 90 tablet 3   Multiple Vitamin (MULTIVITAMIN) tablet Take 1 tablet by mouth daily.     Omega-3 Fatty Acids (FISH OIL) 1000 MG CAPS Take by mouth.     OVER THE COUNTER MEDICATION Take 5,000 mcg by mouth. Garlinase     Red Yeast Rice Extract (  RED YEAST RICE PO) Take by mouth.     triamcinolone ointment (KENALOG) 0.1 % Apply 1 application topically 2 (two) times daily.     No current facility-administered medications for this visit.    No Known Allergies  Social History   Socioeconomic History   Marital status: Married    Spouse name: Not on file   Number of children: 3   Years of education: Not on file   Highest education level: Not on file  Occupational History   Occupation: Retired Information systems manager  Tobacco Use   Smoking status: Never   Smokeless tobacco: Never  Vaping Use   Vaping status: Never Used  Substance and Sexual Activity   Alcohol use: Not Currently    Drug use: Never   Sexual activity: Not Currently    Birth control/protection: Post-menopausal  Other Topics Concern   Not on file  Social History Narrative   Not on file   Social Determinants of Health   Financial Resource Strain: Not on file  Food Insecurity: Not on file  Transportation Needs: Not on file  Physical Activity: Not on file  Stress: Not on file  Social Connections: Not on file  Intimate Partner Violence: Not on file    Family History  Problem Relation Age of Onset   Lung cancer Mother        d. 43   Heart attack Sister    Hypertension Sister    Atrial fibrillation Brother    Hypertension Brother    Heart attack Brother    Irregular heart beat Brother    Breast cancer Paternal Aunt        dx after 3   Breast cancer Cousin        paternal female cousin    Review of Systems:  As stated in the HPI and otherwise negative.   BP 112/60   Pulse 89   Ht 5\' 4"  (1.626 m)   Wt 50.9 kg   SpO2 98%   BMI 19.26 kg/m   Physical Examination:  General: Well developed, well nourished, NAD  HEENT: OP clear, mucus membranes moist  SKIN: warm, dry. No rashes. Neuro: No focal deficits  Musculoskeletal: Muscle strength 5/5 all ext  Psychiatric: Mood and affect normal  Neck: No JVD, no carotid bruits, no thyromegaly, no lymphadenopathy.  Lungs:Clear bilaterally, no wheezes, rhonci, crackles Cardiovascular: Regular rate and rhythm. Harsh systolic murmur.  Abdomen:Soft. Bowel sounds present. Non-tender.  Extremities: 1 + bilateral lower extremity edema.    EKG:  EKG is ordered today. The ekg ordered today demonstrates  EKG Interpretation Date/Time:  Wednesday February 28 2023 15:56:24 EDT Ventricular Rate:  89 PR Interval:    QRS Duration:  150 QT Interval:  420 QTC Calculation: 511 R Axis:   -82  Text Interpretation: Atrial fibrillation with occasional Premature ventricular complexes Left axis deviation Left bundle branch block Confirmed by Verne Carrow [phone]) on 02/28/2023 4:03:45 PM   EKG Interpretation Date/Time:  Wednesday February 28 2023 15:56:24 EDT Ventricular Rate:  89 PR Interval:    QRS Duration:  150 QT Interval:  420 QTC Calculation: 511 R Axis:   -82  Text Interpretation: Atrial fibrillation with occasional Premature ventricular complexes Left axis deviation Left bundle branch block Confirmed by Verne Carrow [phone]) on 02/28/2023 4:03:45 PM  Echo December 2023:    1. Left ventricular ejection fraction, by estimation, is 45 to 50%. Left  ventricular ejection fraction by 3D volume is 45 %. The left ventricle has  mildly decreased function. The left ventricle demonstrates global  hypokinesis. There is mild asymmetric  left ventricular hypertrophy of the basal-septal segment. Left ventricular  diastolic parameters are consistent with Grade I diastolic dysfunction  (impaired relaxation).   2. Right ventricular systolic function is normal. The right ventricular  size is mildly enlarged. There is normal pulmonary artery systolic  pressure.   3. Left atrial size was moderately dilated.   4. Right atrial size was moderately dilated.   5. The mitral valve is degenerative. Mild mitral valve regurgitation. No  evidence of mitral stenosis.   6. Tricuspid valve regurgitation is mild to moderate.   7. The aortic valve is calcified. Aortic valve regurgitation is mild.  Moderate to severe aortic valve stenosis. Aortic valve area, by VTI  measures 0.94 cm. Aortic valve mean gradient measures 28.7 mmHg. Aortic  valve Vmax measures 3.39 m/s.   8. Abdominal aorta is normal sized.   9. The inferior vena cava is dilated in size with >50% respiratory  variability, suggesting right atrial pressure of 8 mmHg.   FINDINGS   Left Ventricle: Left ventricular ejection fraction, by estimation, is 45  to 50%. Left ventricular ejection fraction by 3D volume is 45 %. The left  ventricle has mildly decreased function. The left  ventricle demonstrates  global hypokinesis. The left  ventricular internal cavity size was normal in size. There is mild  asymmetric left ventricular hypertrophy of the basal-septal segment. Left  ventricular diastolic parameters are consistent with Grade I diastolic  dysfunction (impaired relaxation).   Right Ventricle: The right ventricular size is mildly enlarged. No  increase in right ventricular wall thickness. Right ventricular systolic  function is normal. There is normal pulmonary artery systolic pressure.  The tricuspid regurgitant velocity is 2.32   m/s, and with an assumed right atrial pressure of 8 mmHg, the estimated  right ventricular systolic pressure is 29.5 mmHg.   Left Atrium: Left atrial size was moderately dilated.   Right Atrium: Right atrial size was moderately dilated.   Pericardium: There is no evidence of pericardial effusion.   Mitral Valve: The mitral valve is degenerative in appearance. There is  mild thickening of the mitral valve leaflet(s). There is moderate  calcification of the mitral valve leaflet(s). Mild mitral valve  regurgitation. No evidence of mitral valve stenosis.   Tricuspid Valve: The tricuspid valve is grossly normal. Tricuspid valve  regurgitation is mild to moderate. No evidence of tricuspid stenosis.   Aortic Valve: The aortic valve is calcified. Aortic valve regurgitation is  mild. Aortic regurgitation PHT measures 485 msec. Moderate to severe  aortic stenosis is present. Aortic valve mean gradient measures 28.7 mmHg.  Aortic valve peak gradient measures   46.1 mmHg. Aortic valve area, by VTI measures 0.94 cm.   Pulmonic Valve: The pulmonic valve was grossly normal. Pulmonic valve  regurgitation is mild to moderate. No evidence of pulmonic stenosis.   Aorta: Abdominal aorta is normal sized. The aortic root and ascending  aorta are structurally normal, with no evidence of dilitation.   Venous: The inferior vena cava is dilated  in size with greater than 50%  respiratory variability, suggesting right atrial pressure of 8 mmHg.   IAS/Shunts: No atrial level shunt detected by color flow Doppler.     LEFT VENTRICLE  PLAX 2D  LVIDd:         4.70 cm         Diastology  LVIDs:  3.60 cm         LV e' medial:    4.28 cm/s  LV PW:         1.10 cm         LV E/e' medial:  18.0  LV IVS:        1.30 cm         LV e' lateral:   5.33 cm/s  LVOT diam:     2.20 cm         LV E/e' lateral: 14.5  LV SV:         75  LV SV Index:   47  LVOT Area:     3.80 cm        3D Volume EF                                 LV 3D EF:    Left                                              ventricul                                              ar                                              ejection                                              fraction                                              by 3D                                              volume is                                              45 %.                                   3D Volume EF:                                 3D EF:        45 %  LV EDV:       139 ml                                 LV ESV:       76 ml                                 LV SV:        63 ml   RIGHT VENTRICLE  RV Basal diam:  4.50 cm  RV Mid diam:    2.80 cm  RV S prime:     18.30 cm/s  TAPSE (M-mode): 2.3 cm   LEFT ATRIUM             Index        RIGHT ATRIUM           Index  LA diam:        3.80 cm 2.42 cm/m   RA Area:     20.20 cm  LA Vol (A2C):   56.2 ml 35.72 ml/m  RA Volume:   58.50 ml  37.18 ml/m  LA Vol (A4C):   87.7 ml 55.74 ml/m  LA Biplane Vol: 70.9 ml 45.06 ml/m   AORTIC VALVE                     PULMONIC VALVE  AV Area (Vmax):    1.09 cm      PR End Diast Vel: 3.93 msec  AV Area (Vmean):   0.95 cm  AV Area (VTI):     0.94 cm  AV Vmax:           339.33 cm/s  AV Vmean:          253.333 cm/s  AV VTI:            0.793 m  AV Peak Grad:       46.1 mmHg  AV Mean Grad:      28.7 mmHg  LVOT Vmax:         97.50 cm/s  LVOT Vmean:        63.000 cm/s  LVOT VTI:          0.196 m  LVOT/AV VTI ratio: 0.25  AI PHT:            485 msec    AORTA  Ao Root diam: 3.33 cm  Ao Asc diam:  3.30 cm   MITRAL VALVE               TRICUSPID VALVE  MV Area (PHT): 4.07 cm    TR Peak grad:   21.5 mmHg  MV Decel Time: 187 msec    TR Vmax:        232.00 cm/s  MV E velocity: 77.10 cm/s  MV A velocity: 67.30 cm/s  SHUNTS  MV E/A ratio:  1.15        Systemic VTI:  0.20 m                             Systemic Diam: 2.20 cm    Recent Labs: 11/06/2022: BUN 21; Creatinine, Ser 1.05; Hemoglobin 12.1; Platelets 197; Potassium 4.7; Sodium 142   Lipid Panel No results found for: "CHOL", "TRIG", "HDL", "CHOLHDL", "VLDL", "LDLCALC", "LDLDIRECT"   Wt Readings from Last 3 Encounters:  02/28/23 50.9 kg  11/06/22 52.7 kg  12/15/21 54.3 kg    Assessment and Plan:   1. Tricuspid regurgitation: Mild to moderate by echo in December 2023.   2. Aortic stenosis: Moderately severe by echo December 2023. She is now having issues with volume retention. Will plan to repeat her echo now.   3. Acute on chronic diastolic CHF: She is feeling better on Lasix. Will continue Lasix and potassium but with ongoing LE edema will have her increase her Lasix to 40 mg. BMET today.   4. PVCs/PACs: She does not feel her heart skipping or racing  5. Atrial fibrillation, paroxysmal: She is in atrial fibrillation, rate controlled. CHADS VASC score 3. Continue Eliquis.   Labs/ tests ordered today include:   Orders Placed This Encounter  Procedures   Basic Metabolic Panel (BMET)   EKG 12-Lead    Disposition:   F/U with me in 4-6 weeks.    Signed, Verne Carrow, MD 02/28/2023 4:38 PM    [facility] [address] [place], [place], [place]  [address] Phone: [phone]; Fax: [phone]

## 2023-03-01 LAB — BASIC METABOLIC PANEL
BUN/Creatinine Ratio: 17 (ref 12–28)
BUN: 21 mg/dL (ref 8–27)
CO2: 26 mmol/L (ref 20–29)
Calcium: 9.1 mg/dL (ref 8.7–10.3)
Chloride: 105 mmol/L (ref 96–106)
Creatinine, Ser: 1.26 mg/dL — ABNORMAL HIGH (ref 0.57–1.00)
Glucose: 73 mg/dL (ref 70–99)
Potassium: 5 mmol/L (ref 3.5–5.2)
Sodium: 143 mmol/L (ref 134–144)
eGFR: 43 mL/min/{1.73_m2} — ABNORMAL LOW (ref >59)

## 2023-03-07 ENCOUNTER — Telehealth: Payer: Self-pay | Admitting: Cardiovascular Disease

## 2023-03-07 NOTE — Telephone Encounter (Signed)
Lasix increased at office visit 02/28/23.

## 2023-03-07 NOTE — Telephone Encounter (Signed)
Pt c/o medication issue:  1. Name of Medication: furosemide (LASIX) 40 MG tablet   2. How are you currently taking this medication (dosage and times per day)? Take 1 tablet (40 mg total) by mouth daily.   3. Are you having a reaction (difficulty breathing--STAT)? no  4. What is your medication issue? Patient wants to take the medication at 20mg  not 40mg . Please advise

## 2023-03-08 ENCOUNTER — Ambulatory Visit (HOSPITAL_BASED_OUTPATIENT_CLINIC_OR_DEPARTMENT_OTHER): Payer: Federal, State, Local not specified - PPO

## 2023-03-08 MED ORDER — FUROSEMIDE 20 MG PO TABS: 20.0000 mg | ORAL_TABLET | Freq: Every day | ORAL | 3 refills | Status: DC

## 2023-03-08 NOTE — Telephone Encounter (Signed)
Patient returned called and asked if we could send in 20 mg tablets so she does not have to break them in half. Pt states that she has not picked up the 40 mg prescription. New script sent and pharmacy aware.

## 2023-03-08 NOTE — Telephone Encounter (Signed)
Left message for pt to call back in regards to Lasix Recommendation.

## 2023-03-13 ENCOUNTER — Other Ambulatory Visit: Payer: Self-pay | Admitting: Cardiovascular Disease

## 2023-03-13 DIAGNOSIS — I493 Ventricular premature depolarization: Secondary | ICD-10-CM

## 2023-03-13 DIAGNOSIS — I071 Rheumatic tricuspid insufficiency: Secondary | ICD-10-CM

## 2023-03-13 DIAGNOSIS — I350 Nonrheumatic aortic (valve) stenosis: Secondary | ICD-10-CM

## 2023-03-13 DIAGNOSIS — I4891 Unspecified atrial fibrillation: Secondary | ICD-10-CM

## 2023-04-05 ENCOUNTER — Ambulatory Visit (HOSPITAL_BASED_OUTPATIENT_CLINIC_OR_DEPARTMENT_OTHER)
Admission: RE | Admit: 2023-04-05 | Discharge: 2023-04-05 | Disposition: A | Discharge status: Home / Self Care | Payer: Federal, State, Local not specified - PPO | Source: Ambulatory Visit | Attending: Cardiovascular Disease | Admitting: Cardiovascular Disease

## 2023-04-05 DIAGNOSIS — I350 Nonrheumatic aortic (valve) stenosis: Secondary | ICD-10-CM | POA: Insufficient documentation

## 2023-04-05 DIAGNOSIS — I4891 Unspecified atrial fibrillation: Secondary | ICD-10-CM | POA: Diagnosis present

## 2023-04-05 DIAGNOSIS — I493 Ventricular premature depolarization: Secondary | ICD-10-CM | POA: Insufficient documentation

## 2023-04-05 DIAGNOSIS — I071 Rheumatic tricuspid insufficiency: Secondary | ICD-10-CM | POA: Diagnosis present

## 2023-04-05 LAB — ECHOCARDIOGRAM COMPLETE
AR max vel: 0.88 cm2
AV Area VTI: 0.83 cm2
AV Area mean vel: 0.82 cm2
AV Mean grad: 29.8 mm[Hg]
AV Peak grad: 45.3 mm[Hg]
Ao pk vel: 3.36 m/s
Area-P 1/2: 5.48 cm2
Calc EF: 27.8 %
Est EF: <20
MV M vel: 4.66 m/s
MV Peak grad: 87 mm[Hg]
MV Vena cont: 0.2 cm
P 1/2 time: 519 ms
Radius: 0.4 cm
S' Lateral: 4.9 cm
Single Plane A2C EF: 31.7 %
Single Plane A4C EF: 25.4 %

## 2023-04-11 ENCOUNTER — Ambulatory Visit: Payer: Federal, State, Local not specified - PPO | Attending: Cardiovascular Disease | Admitting: Cardiovascular Disease

## 2023-04-11 ENCOUNTER — Telehealth: Payer: Self-pay

## 2023-04-11 NOTE — Telephone Encounter (Signed)
Pt was scheduled to see McAlhany today at 2:40pm. Reached out to her three times and was able to leave a message. This appt was made 5 days ago by Lendon Ka, RN after pt missed last appt. Apparently daughter did come to last scheduled appt, but patient herself didn't show up until after 5 and had to be rescheduled. Today patient states she had no idea about today's appointment "no one called me or told me, I had no idea there was an appointment." Explained all the above information to her and she states, "no, I didn't have an appointment today." Dr Clifton James states that her EF has dropped significantly and he wants to see her sooner rather than later. He agreed to work her into his DOD day next Thursday at 1:30pm. Offered to patient who states she cannot make that time work. She said "that's entirely too early in the day for me, I take care of my sick husband." Clarified that it was 1:30pm, after lunch, and she states "yes, I need as late in the day as possible. Late is good for me." Advised pt that I don't have any more times available for her. She was very upset that she didn't know about today's appt. She asked about her ECHO results and I explained that her heart pumping function was very low and her valve was severely leaking. She replied, "I guess that could make me tired?" I explained yes, absolutely and that her heart function was very concerning. Again asked her about next Thursday, and again, she denied appt stating it's too early. Spoke with Dr Clifton James who states he will see where/when he can possibly add her in. Routing to primary RN and MD for follow-up. Patient does not have DPR on file, but has a daughter listed in her contacts. I did ask if she was okay with me speaking with daughter regarding appointment and she said yes, but followed with, "you can always call me and leave a message if I don't answer and I'll call you back." It seems we need to get daughter involved due to patient's memory  and severity of decreased cardiac function.

## 2023-04-11 NOTE — Progress Notes (Deleted)
No chief complaint on file.  History of Present Illness: 82 yo female with history of atrial fibrillation, breast cancer, hyperlipidemia, aortic stenosis, mitral regurgitation and tricuspid valve regurgitation here today for follow up. I met her in July 2022.  She had been evaluated in March 2022 by Dr. Bing Matter. Echo December 2023 with LVEF=50%. Mild MR. Mild to moderate TR. Moderate to severe AS with mean gradient 28 mmHg, AVA 0.94 cm2, SVI 47, DI 0.25.  Plan was to follow her moderate AS with repeat echo in one year. I saw her in June 2024 and she was doing well. She was found to be in atrial fibrillation at her visit in June 2024 and was started on Eliquis. She was seen in primary care in early October 2024 and reported dyspnea and LE edema. BNP over 2000. She was started on Lasix and potassium.I saw her in the office 02/28/23 and she reported improvement in her dyspnea but still had some LE edema. Echo 04/05/23 with severe LV systolic dysfunction with LVEF less than 20%. Severe enlargement of the left atrium. Severe mitral regurgitation. Moderately severe to severe aortic stenosis with mean gradient 29 mmHg, AVA 0.82 cm2, SVI 42, DI 0.32.   She is here today for follow up. The patient denies any chest pain, dyspnea, palpitations, lower extremity edema, orthopnea, PND, dizziness, near syncope or syncope.     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   Nonrheumatic tricuspid (valve) insufficiency 07/21/2020   Prediabetes 08/30/2020    Past Surgical History:  Procedure  Laterality Date   BREAST LUMPECTOMY WITH RADIOACTIVE SEED LOCALIZATION Left 08/11/2021   Procedure: LEFT BREAST LUMPECTOMY WITH RADIOACTIVE SEED LOCALIZATION X2;  Surgeon: Griselda Miner, MD;  Location: [facility];  Service: General;  Laterality: Left;   RE-EXCISION OF BREAST CANCER,SUPERIOR MARGINS Left 10/07/2021   Procedure: RE-EXCISION OF LEFT BREAST INFERIOR MARGIN;  Surgeon: Chevis Pretty III, MD;  Location: [facility];  Service: General;  Laterality: Left;    Current Outpatient Medications  Medication Sig Dispense Refill   apixaban (ELIQUIS) 2.5 MG TABS tablet Take 1 tablet (2.5 mg total) by mouth 2 (two) times daily. 60 tablet 5   Biotin 1 MG CAPS Take by mouth.     furosemide (LASIX) 20 MG tablet Take 1 tablet (20 mg total) by mouth daily. May take a extra tablet for increase weight gain of 2 pounds 90 tablet 3   KLOR-CON 10 10 MEQ tablet Take 10 mEq by mouth 2 (two) times daily.     letrozole (FEMARA) 2.5 MG tablet Take 1 tablet (2.5 mg total) by mouth daily. 90 tablet 3   Multiple Vitamin (MULTIVITAMIN) tablet Take 1 tablet by mouth daily.     Omega-3 Fatty Acids (FISH OIL) 1000 MG CAPS Take by mouth.     OVER THE COUNTER MEDICATION Take 5,000 mcg by mouth. Garlinase     Red Yeast Rice Extract (RED YEAST RICE PO) Take by mouth.     triamcinolone ointment (KENALOG) 0.1 % Apply 1 application topically  2 (two) times daily.     No current facility-administered medications for this visit.    No Known Allergies  Social History   Socioeconomic History   Marital status: Married    Spouse name: Not on file   Number of children: 3   Years of education: Not on file   Highest education level: Not on file  Occupational History   Occupation: Retired Information systems manager  Tobacco Use   Smoking status: Never   Smokeless tobacco: Never  Vaping Use   Vaping status: Never Used  Substance and Sexual Activity   Alcohol use: Not Currently   Drug use: Never   Sexual  activity: Not Currently    Birth control/protection: Post-menopausal  Other Topics Concern   Not on file  Social History Narrative   Not on file   Social Determinants of Health   Financial Resource Strain: Not on file  Food Insecurity: Not on file  Transportation Needs: Not on file  Physical Activity: Not on file  Stress: Not on file  Social Connections: Not on file  Intimate Partner Violence: Not on file    Family History  Problem Relation Age of Onset   Lung cancer Mother        d. 32   Heart attack Sister    Hypertension Sister    Atrial fibrillation Brother    Hypertension Brother    Heart attack Brother    Irregular heart beat Brother    Breast cancer Paternal Aunt        dx after 17   Breast cancer Cousin        paternal female cousin    Review of Systems:  As stated in the HPI and otherwise negative.   There were no vitals taken for this visit.  Physical Examination:  General: Well developed, well nourished, NAD  HEENT: OP clear, mucus membranes moist  SKIN: warm, dry. No rashes. Neuro: No focal deficits  Musculoskeletal: Muscle strength 5/5 all ext  Psychiatric: Mood and affect normal  Neck: No JVD, no carotid bruits, no thyromegaly, no lymphadenopathy.  Lungs:Clear bilaterally, no wheezes, rhonci, crackles Cardiovascular: Regular rate and rhythm. No murmurs, gallops or rubs. Abdomen:Soft. Bowel sounds present. Non-tender.  Extremities: ***  lower extremity edema. Pulses are 2 + in the bilateral DP/PT.  EKG:  EKG is *** ordered today. The ekg ordered today demonstrates   Recent Labs: 11/06/2022: Hemoglobin 12.1; Platelets 197 02/28/2023: BUN 21; Creatinine, Ser 1.26; Potassium 5.0; Sodium 143   Lipid Panel No results found for: "CHOL", "TRIG", "HDL", "CHOLHDL", "VLDL", "LDLCALC", "LDLDIRECT"   Wt Readings from Last 3 Encounters:  02/28/23 50.9 kg  11/06/22 52.7 kg  12/15/21 54.3 kg    Assessment and Plan:   1. Severe LV systolic dysfunction:   ***  2. Aortic stenosis: Moderately severe by echo November 2024.    3. Severe mitral regurgitation:   3. Acute on chronic systolic CHF:  4. PVCs/PACs:   5. Atrial fibrillation, paroxysmal: *** Rate controlled atrial fibrillation. . Continue Eliquis.   Labs/ tests ordered today include:   No orders of the defined types were placed in this encounter.   Disposition:   F/U with me in 4-6 weeks.    Signed, Verne Carrow, MD 04/11/2023 6:27 AM    [facility] [address] [place], [place], [place]  [address] Phone: [phone]; Fax: [phone]

## 2023-04-16 NOTE — Progress Notes (Unsigned)
No chief complaint on file.  History of Present Illness: 82 yo female with history of atrial fibrillation, breast cancer, hyperlipidemia, aortic stenosis, mitral regurgitation and tricuspid valve regurgitation here today for follow up. I met her in July 2022.  She had been evaluated in March 2022 by Dr. Bing Matter. Echo December 2023 with LVEF=50%. Mild MR. Mild to moderate TR. Moderate to severe AS with mean gradient 28 mmHg, AVA 0.94 cm2, SVI 47, DI 0.25.  Plan was to follow her moderate AS with repeat echo in one year. I saw her in June 2024 and she was doing well. She was found to be in atrial fibrillation at her visit in June 2024 and was started on Eliquis. She was seen in primary care in early October 2024 and reported dyspnea and LE edema. BNP over 2000. She was started on Lasix and potassium.I saw her in the office 02/28/23 and she reported improvement in her dyspnea but still had some LE edema. Echo 04/05/23 with severe LV systolic dysfunction with LVEF less than 20%. Severe enlargement of the left atrium. Severe mitral regurgitation. Moderately severe to severe aortic stenosis with mean gradient 29 mmHg, AVA 0.82 cm2, SVI 42, DI 0.32.   She is here today for follow up. The patient denies any chest pain, dyspnea, palpitations, lower extremity edema, orthopnea, PND, dizziness, near syncope or syncope.     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   Nonrheumatic tricuspid (valve) insufficiency 07/21/2020   Prediabetes 08/30/2020    Past Surgical History:  Procedure  Laterality Date   BREAST LUMPECTOMY WITH RADIOACTIVE SEED LOCALIZATION Left 08/11/2021   Procedure: LEFT BREAST LUMPECTOMY WITH RADIOACTIVE SEED LOCALIZATION X2;  Surgeon: Griselda Miner, MD;  Location: [facility];  Service: General;  Laterality: Left;   RE-EXCISION OF BREAST CANCER,SUPERIOR MARGINS Left 10/07/2021   Procedure: RE-EXCISION OF LEFT BREAST INFERIOR MARGIN;  Surgeon: Chevis Pretty III, MD;  Location: [facility];  Service: General;  Laterality: Left;    Current Outpatient Medications  Medication Sig Dispense Refill   apixaban (ELIQUIS) 2.5 MG TABS tablet Take 1 tablet (2.5 mg total) by mouth 2 (two) times daily. 60 tablet 5   Biotin 1 MG CAPS Take by mouth.     furosemide (LASIX) 20 MG tablet Take 1 tablet (20 mg total) by mouth daily. May take a extra tablet for increase weight gain of 2 pounds 90 tablet 3   KLOR-CON 10 10 MEQ tablet Take 10 mEq by mouth 2 (two) times daily.     letrozole (FEMARA) 2.5 MG tablet Take 1 tablet (2.5 mg total) by mouth daily. 90 tablet 3   Multiple Vitamin (MULTIVITAMIN) tablet Take 1 tablet by mouth daily.     Omega-3 Fatty Acids (FISH OIL) 1000 MG CAPS Take by mouth.     OVER THE COUNTER MEDICATION Take 5,000 mcg by mouth. Garlinase     Red Yeast Rice Extract (RED YEAST RICE PO) Take by mouth.     triamcinolone ointment (KENALOG) 0.1 % Apply 1 application topically  2 (two) times daily.     No current facility-administered medications for this visit.    No Known Allergies  Social History   Socioeconomic History   Marital status: Married    Spouse name: Not on file   Number of children: 3   Years of education: Not on file   Highest education level: Not on file  Occupational History   Occupation: Retired Information systems manager  Tobacco Use   Smoking status: Never   Smokeless tobacco: Never  Vaping Use   Vaping status: Never Used  Substance and Sexual Activity   Alcohol use: Not Currently   Drug use: Never   Sexual  activity: Not Currently    Birth control/protection: Post-menopausal  Other Topics Concern   Not on file  Social History Narrative   Not on file   Social Determinants of Health   Financial Resource Strain: Not on file  Food Insecurity: Not on file  Transportation Needs: Not on file  Physical Activity: Not on file  Stress: Not on file  Social Connections: Not on file  Intimate Partner Violence: Not on file    Family History  Problem Relation Age of Onset   Lung cancer Mother        d. 79   Heart attack Sister    Hypertension Sister    Atrial fibrillation Brother    Hypertension Brother    Heart attack Brother    Irregular heart beat Brother    Breast cancer Paternal Aunt        dx after 64   Breast cancer Cousin        paternal female cousin    Review of Systems:  As stated in the HPI and otherwise negative.   There were no vitals taken for this visit.  Physical Examination:  General: Well developed, well nourished, NAD  HEENT: OP clear, mucus membranes moist  SKIN: warm, dry. No rashes. Neuro: No focal deficits  Musculoskeletal: Muscle strength 5/5 all ext  Psychiatric: Mood and affect normal  Neck: No JVD, no carotid bruits, no thyromegaly, no lymphadenopathy.  Lungs:Clear bilaterally, no wheezes, rhonci, crackles Cardiovascular: Regular rate and rhythm. No murmurs, gallops or rubs. Abdomen:Soft. Bowel sounds present. Non-tender.  Extremities: ***  lower extremity edema. Pulses are 2 + in the bilateral DP/PT.  EKG:  EKG is *** ordered today. The ekg ordered today demonstrates   Recent Labs: 11/06/2022: Hemoglobin 12.1; Platelets 197 02/28/2023: BUN 21; Creatinine, Ser 1.26; Potassium 5.0; Sodium 143   Lipid Panel No results found for: "CHOL", "TRIG", "HDL", "CHOLHDL", "VLDL", "LDLCALC", "LDLDIRECT"   Wt Readings from Last 3 Encounters:  02/28/23 50.9 kg  11/06/22 52.7 kg  12/15/21 54.3 kg    Assessment and Plan:   1. Severe LV systolic dysfunction:   ***  2. Aortic stenosis: Moderately severe by echo November 2024.    3. Severe mitral regurgitation:   3. Acute on chronic systolic CHF:  4. PVCs/PACs:   5. Atrial fibrillation, paroxysmal: *** Rate controlled atrial fibrillation. . Continue Eliquis.   Labs/ tests ordered today include:   No orders of the defined types were placed in this encounter.   Disposition:   F/U with me in 4-6 weeks.    Signed, Verne Carrow, MD 04/16/2023 6:04 PM    [facility] [address] [place], [place], [place]  [address] Phone: [phone]; Fax: [phone]

## 2023-04-16 NOTE — Telephone Encounter (Signed)
Left message for patient to call back.  Called and spoke w daughter [name] about appointment.  She will check with patient today about coming in tomorrow for 3:40 pm.  At that point we will work to move the current 3:40 pm patient up.

## 2023-04-16 NOTE — Telephone Encounter (Signed)
Patient returned my phone call.  She will come in tomorrow for a 3:40 pm appointment with Dr. Clifton James.

## 2023-04-17 ENCOUNTER — Ambulatory Visit: Payer: Federal, State, Local not specified - PPO | Attending: Cardiovascular Disease | Admitting: Cardiovascular Disease

## 2023-04-17 ENCOUNTER — Encounter: Payer: Self-pay | Admitting: Cardiovascular Disease

## 2023-04-17 VITALS — BP 98/76 | HR 66 | Ht 64.0 in | Wt 111.6 lb

## 2023-04-17 DIAGNOSIS — I5022 Chronic systolic (congestive) heart failure: Secondary | ICD-10-CM | POA: Diagnosis not present

## 2023-04-17 DIAGNOSIS — I420 Dilated cardiomyopathy: Secondary | ICD-10-CM

## 2023-04-17 DIAGNOSIS — I4819 Other persistent atrial fibrillation: Secondary | ICD-10-CM | POA: Diagnosis not present

## 2023-04-17 DIAGNOSIS — I350 Nonrheumatic aortic (valve) stenosis: Secondary | ICD-10-CM | POA: Diagnosis not present

## 2023-04-17 MED ORDER — KLOR-CON 10 10 MEQ PO TBCR: 10.0000 meq | EXTENDED_RELEASE_TABLET | Freq: Two times a day (BID) | ORAL | 3 refills | Status: DC

## 2023-04-17 NOTE — Addendum Note (Signed)
Addended by: Lendon Ka on: 04/17/2023 05:42 PM   Modules accepted: Orders

## 2023-04-17 NOTE — Patient Instructions (Signed)
Medication Instructions:  No changes today *If you need a refill on your cardiac medications before your next appointment, please call your pharmacy*   Lab Work: Today: bmet - first floor [facility]  If you have labs (blood work) drawn today and your tests are completely normal, you will receive your results only by: MyChart Message (if you have MyChart) OR A paper copy in the mail If you have any lab test that is abnormal or we need to change your treatment, we will call you to review the results.   Testing/Procedures: none   Follow-Up: At [facility] - see appointment information below  Other Instructions You have been referred to [facility]

## 2023-05-01 ENCOUNTER — Ambulatory Visit: Payer: Federal, State, Local not specified - PPO | Admitting: Physician Assistant

## 2023-05-07 ENCOUNTER — Other Ambulatory Visit (HOSPITAL_BASED_OUTPATIENT_CLINIC_OR_DEPARTMENT_OTHER): Payer: Federal, State, Local not specified - PPO

## 2023-05-09 ENCOUNTER — Telehealth: Payer: Self-pay | Admitting: Cardiology

## 2023-05-09 NOTE — Telephone Encounter (Signed)
   Patient called answering service this afternoon reporting increased swelling in her legs over the last couple of days. She does endorse increased salt and sweets in her diet this week. Denies dyspnea beyond baseline. Patient did take an extra 20mg  lasix yesterday, has not yet taken any lasix today but says swelling is about the same today. Encouraged her to go head with a 20mg  dose now and again later. She has pending CHF appointment with our heart failure team tomorrow 12/26. Emergency precautions for acute dyspnea reviewed with patient.   Perlie Gold, PA-C

## 2023-05-10 ENCOUNTER — Encounter (HOSPITAL_COMMUNITY): Payer: Self-pay | Admitting: Internal Medicine

## 2023-05-10 ENCOUNTER — Encounter (HOSPITAL_COMMUNITY): Payer: Self-pay

## 2023-05-10 ENCOUNTER — Ambulatory Visit (HOSPITAL_COMMUNITY)
Admission: RE | Admit: 2023-05-10 | Discharge: 2023-05-10 | Disposition: A | Discharge status: Home / Self Care | Payer: Federal, State, Local not specified - PPO | Source: Ambulatory Visit | Attending: Internal Medicine | Admitting: Internal Medicine

## 2023-05-10 VITALS — BP 98/50 | HR 97 | Wt 117.0 lb

## 2023-05-10 DIAGNOSIS — R600 Localized edema: Secondary | ICD-10-CM | POA: Insufficient documentation

## 2023-05-10 DIAGNOSIS — Z79899 Other long term (current) drug therapy: Secondary | ICD-10-CM | POA: Diagnosis not present

## 2023-05-10 DIAGNOSIS — I959 Hypotension, unspecified: Secondary | ICD-10-CM | POA: Diagnosis not present

## 2023-05-10 DIAGNOSIS — Z803 Family history of malignant neoplasm of breast: Secondary | ICD-10-CM | POA: Insufficient documentation

## 2023-05-10 DIAGNOSIS — I350 Nonrheumatic aortic (valve) stenosis: Secondary | ICD-10-CM | POA: Diagnosis not present

## 2023-05-10 DIAGNOSIS — Z7901 Long term (current) use of anticoagulants: Secondary | ICD-10-CM | POA: Insufficient documentation

## 2023-05-10 DIAGNOSIS — Z853 Personal history of malignant neoplasm of breast: Secondary | ICD-10-CM | POA: Insufficient documentation

## 2023-05-10 DIAGNOSIS — I493 Ventricular premature depolarization: Secondary | ICD-10-CM | POA: Insufficient documentation

## 2023-05-10 DIAGNOSIS — I459 Conduction disorder, unspecified: Secondary | ICD-10-CM | POA: Insufficient documentation

## 2023-05-10 DIAGNOSIS — I5023 Acute on chronic systolic (congestive) heart failure: Secondary | ICD-10-CM | POA: Diagnosis present

## 2023-05-10 DIAGNOSIS — I4819 Other persistent atrial fibrillation: Secondary | ICD-10-CM | POA: Diagnosis not present

## 2023-05-10 DIAGNOSIS — R5383 Other fatigue: Secondary | ICD-10-CM | POA: Diagnosis not present

## 2023-05-10 DIAGNOSIS — I083 Combined rheumatic disorders of mitral, aortic and tricuspid valves: Secondary | ICD-10-CM | POA: Diagnosis not present

## 2023-05-10 DIAGNOSIS — Z8249 Family history of ischemic heart disease and other diseases of the circulatory system: Secondary | ICD-10-CM | POA: Insufficient documentation

## 2023-05-10 DIAGNOSIS — I447 Left bundle-branch block, unspecified: Secondary | ICD-10-CM | POA: Insufficient documentation

## 2023-05-10 LAB — CBC
HCT: 38.1 % (ref 36.0–46.0)
Hemoglobin: 12.3 g/dL (ref 12.0–15.0)
MCH: 30.3 pg (ref 26.0–34.0)
MCHC: 32.3 g/dL (ref 30.0–36.0)
MCV: 93.8 fL (ref 80.0–100.0)
Platelets: 237 10*3/uL (ref 150–400)
RBC: 4.06 MIL/uL (ref 3.87–5.11)
RDW: 15 % (ref 11.5–15.5)
WBC: 8.1 10*3/uL (ref 4.0–10.5)
nRBC: 0 % (ref 0.0–0.2)

## 2023-05-10 LAB — BRAIN NATRIURETIC PEPTIDE: B Natriuretic Peptide: 4471.5 pg/mL — ABNORMAL HIGH (ref 0.0–100.0)

## 2023-05-10 MED ORDER — FUROSEMIDE 20 MG PO TABS: 60.0000 mg | ORAL_TABLET | Freq: Every day | ORAL | 3 refills | Status: DC

## 2023-05-10 NOTE — Progress Notes (Signed)
ADVANCED [facility] CLINIC CONSULT NOTE  Referring Physician: Merri Brunette, MD Primary Care: Merri Brunette, MD Primary Cardiologist: Verne Carrow, MD  Chief Complaint: Heart failure  HPI:  Tara Hardin is an 82 yo female with history of  persistent AF, breast cancer, aortic stenosis. LBBB and recently diagnosed systolic HF. Referred by Dr. Clifton James for further evaluation of her HF.   Echo 12/23 EF 50%. Mild MR. Mild to moderate TR. Moderate to severe AS with mean gradient 28 mmHg, AVA 0.94 cm2, SVI 47, DI 0.25.  Plan was to follow her moderate AS with repeat echo in one year.   Developed AF in 6/24 and was started on Eliquis. In October 2024 developed HF symptoms. Echo 04/05/23 EF < 20%. Severe enlargement of the left atrium. Mild to moderate MR. Moderately severe aortic stenosis with mean gradient 29 mmHg, AVA 0.82-0.88cm2, SVI 42, DI 0.32.   She saw Dr. Clifton James on 04/17/23 and volume status looked good on lasix 20 daily but BP too low to start GDMT. She was referred here for further HF evalaution /  Today she presents to the A[facility] Clinic to establish care. She is here with her son and daughter in law (they live in [place]). She is very active around the house and takes care of her bedridden husband (he had MVA years ago and has been full care). Denies CP and SOB. Reports fatigue throughout the day but she tries to stay busy. She has had BLE edema for a couple of weeks now that would previously resolve with her lasix but over the last 2 weeks she has noticed progressive worsening of edema up to her hips despite doubling her Lasix. Denies dizziness. Weighs at home 111-117 lbs. Watches what she's eats, tries to limit salt intake. Drinks ~48oz fluid/day.    Family history: Brother passed away, had a fib. Sister has an ICD. Sister passed away from CAD.    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 60 tablet 5   Biotin 1 MG CAPS Take by mouth.     furosemide (LASIX) 20 MG tablet Take 1 tablet (20 mg total) by mouth daily. May take a extra tablet for increase weight gain of 2 pounds 90 tablet 3   letrozole (FEMARA) 2.5 MG tablet Take 1 tablet (2.5 mg total) by mouth daily. 90 tablet 3   Multiple Vitamin (MULTIVITAMIN) tablet Take 1 tablet by mouth daily.     Omega-3 Fatty Acids (FISH OIL) 1000 MG CAPS Take by mouth.     OVER THE COUNTER MEDICATION Take 5,000 mcg by mouth. Garlinase     potassium chloride (KLOR-CON M) 10 MEQ tablet Take 10 mEq by mouth daily.     Red Yeast Rice Extract (RED YEAST RICE PO) Take by mouth.     triamcinolone ointment (KENALOG) 0.1 % Apply 1 application topically 2 (  two) times daily.     No current facility-administered medications for this encounter.    No Known Allergies    Social History   Socioeconomic History   Marital status: Married    Spouse name: Not on file   Number of children: 3   Years of education: Not on file   Highest education level: Not on file  Occupational History   Occupation: Retired Information systems manager  Tobacco Use   Smoking status: Never   Smokeless tobacco: Never  Vaping Use   Vaping status: Never Used  Substance and Sexual Activity   Alcohol use: Not Currently   Drug use: Never   Sexual activity: Not Currently    Birth control/protection: Post-menopausal  Other Topics Concern   Not on file   Social History Narrative   Not on file   Social Drivers of Health   Financial Resource Strain: Not on file  Food Insecurity: Not on file  Transportation Needs: Not on file  Physical Activity: Not on file  Stress: Not on file  Social Connections: Not on file  Intimate Partner Violence: Not on file      Family History  Problem Relation Age of Onset   Lung cancer Mother        d. 51   Heart attack Sister    Hypertension Sister    Atrial fibrillation Brother    Hypertension Brother    Heart attack Brother    Irregular heart beat Brother    Breast cancer Paternal Aunt        dx after 58   Breast cancer Cousin        paternal female cousin    Vitals:   05/10/23 1546  BP: (!) 98/50  Pulse: 97  SpO2: 97%  Weight: 53.1 kg (117 lb)   Wt Readings from Last 3 Encounters:  05/10/23 53.1 kg (117 lb)  04/17/23 50.6 kg (111 lb 9.6 oz)  02/28/23 50.9 kg (112 lb 3.2 oz)     PHYSICAL EXAM: General:  Elderly appearing. No respiratory difficulty HEENT: normal Neck: supple. JVD to ear. Carotids 2+ bilat; no bruits. No lymphadenopathy or thryomegaly appreciated. Cor: PMI nondisplaced. Irregualr rhythm and regular rate. No rubs, gallops. +AS/MR murmur Lungs: clear, diminished bases R>L Abdomen: soft, nontender, nondistended. No hepatosplenomegaly. No bruits or masses. Good bowel sounds. Extremities: no cyanosis, clubbing, rash, +3 bilateral edema to thigh Neuro: alert & oriented x 3, cranial nerves grossly intact. moves all 4 extremities w/o difficulty. Affect pleasant.  ECG: 02/28/23 AF 89  LBBB 1 PVC  ECG: 05/10/23 AF 91 LBBB No PVC Personally reviewed   ASSESSMENT & PLAN:  1. 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 Mild MR. Mild to moderate TR. Moderate to severe AS with mean gradient 28 mmHg, AVA 0.94 cm2, SVI 47, DI 0.25.   - Developed AF in 6/24 and was started on Eliquis. - Echo 04/05/23 EF < 20%. Severe enlargement of the left  atrium. Mild to moderate MR. Moderately severe aortic stenosis with mean gradient 29 mmHg, AVA 0.82-0.88cm2, SVI 42, DI 0.32. 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 LBBB and PVCs may also be contributing though LBBB not that wide and no PVCs on ECG today  - NYHA II-III, - Given degree of volume overload, low BP and fraility, I think it is best to admit her for IV diuresis and GDMT initiation. While in hospital we  can also follow HRs and PVC burden on tele.  - Increase lasix from 20 daily to 60 daily today. Plan IV diuresis starting tomorrow  - Check anemia panel with fatigue - UNNA boots tomorrow - Consider R/L cath to exclude high-grade CAD followed by attempt at restoring NSR once diuresed - Also consider cMRI    2. Aortic stenosis:  - followed by Dr. Clifton James - mostly in the moderate range - degree of LV dysfunction out of proportion to AS   3. Mitral regurgitation: Her echo is reviewed with the structural heart imagine team and the mitral regurgitation is mild to moderate.    4. PVCs/PACs: No recent palpitations.  - may also be contributing to cardiomyopathy - plan to monitor on tele   5. Atrial fibrillation, persistent:  - Rate controlled atrial fibrillation. Question if this is contributing to her LV dysfunction given time course of events.  - Monitor on tele, plan to start amiodarone in the hospital with eventual DC-CV either in hospital or as outpatient - Continue Eliquis. Denies abnormal bleeding CBC today  Plan for direct admission tomorrow for IV diuresis. Patient and family agreeable to plan.    Tara Meres, MD  4:05 PM

## 2023-05-10 NOTE — Patient Instructions (Signed)
INCREASE Lasix to 60 mg daily  Labs done today, your results will be available in MyChart, we will contact you for abnormal readings.  Please wear compression stockings   You will be called tomorrow when bed is available for direct admission please go to entrance A  If you have any questions or concerns before your next appointment please send [place] a message through [place] or call our office at [phone].    TO LEAVE A MESSAGE FOR THE NURSE SELECT OPTION 2, PLEASE LEAVE A MESSAGE INCLUDING: YOUR NAME DATE OF BIRTH CALL BACK NUMBER REASON FOR CALL**this is important as we prioritize the call backs  YOU WILL RECEIVE A CALL BACK THE SAME DAY AS LONG AS YOU CALL BEFORE 4:00 PM  At the [facility], you and your health needs are our priority. As part of our continuing mission to provide you with exceptional heart care, we have created designated Provider Care Teams. These Care Teams include your primary Cardiologist (physician) and Advanced Practice Providers (APPs- Physician Assistants and Nurse Practitioners) who all work together to provide you with the care you need, when you need it.   You may see any of the following providers on your designated Care Team at your next follow up: Dr Arvilla Meres Dr Marca Ancona Dr. Dorthula Nettles Dr. Clearnce Hasten Amy Filbert Schilder, NP Robbie Lis, [place] [facility], [place] Brynda Peon, NP [place] Lee, NP Karle Plumber, PharmD   Please be sure to bring in all your medications bottles to every appointment.    Thank you for choosing [facility]

## 2023-05-11 ENCOUNTER — Other Ambulatory Visit: Payer: Self-pay

## 2023-05-11 ENCOUNTER — Inpatient Hospital Stay (HOSPITAL_COMMUNITY)
Admission: AD | Admit: 2023-05-11 | Discharge: 2023-05-21 | DRG: 287 | Disposition: A | Discharge status: Skilled Nursing Facility | Payer: Medicare Other | Source: Ambulatory Visit | Attending: Cardiology | Admitting: Cardiology

## 2023-05-11 ENCOUNTER — Encounter (HOSPITAL_COMMUNITY): Payer: Self-pay | Admitting: Cardiology

## 2023-05-11 DIAGNOSIS — I2510 Atherosclerotic heart disease of native coronary artery without angina pectoris: Secondary | ICD-10-CM | POA: Diagnosis not present

## 2023-05-11 DIAGNOSIS — Z7901 Long term (current) use of anticoagulants: Secondary | ICD-10-CM | POA: Diagnosis not present

## 2023-05-11 DIAGNOSIS — I340 Nonrheumatic mitral (valve) insufficiency: Secondary | ICD-10-CM | POA: Diagnosis not present

## 2023-05-11 DIAGNOSIS — I5023 Acute on chronic systolic (congestive) heart failure: Secondary | ICD-10-CM | POA: Diagnosis present

## 2023-05-11 DIAGNOSIS — R64 Cachexia: Secondary | ICD-10-CM | POA: Diagnosis present

## 2023-05-11 DIAGNOSIS — Z803 Family history of malignant neoplasm of breast: Secondary | ICD-10-CM | POA: Diagnosis not present

## 2023-05-11 DIAGNOSIS — I5043 Acute on chronic combined systolic (congestive) and diastolic (congestive) heart failure: Secondary | ICD-10-CM | POA: Diagnosis not present

## 2023-05-11 DIAGNOSIS — I509 Heart failure, unspecified: Secondary | ICD-10-CM | POA: Diagnosis not present

## 2023-05-11 DIAGNOSIS — Z634 Disappearance and death of family member: Secondary | ICD-10-CM | POA: Diagnosis not present

## 2023-05-11 DIAGNOSIS — Z79811 Long term (current) use of aromatase inhibitors: Secondary | ICD-10-CM | POA: Diagnosis not present

## 2023-05-11 DIAGNOSIS — I493 Ventricular premature depolarization: Secondary | ICD-10-CM | POA: Diagnosis present

## 2023-05-11 DIAGNOSIS — Z853 Personal history of malignant neoplasm of breast: Secondary | ICD-10-CM

## 2023-05-11 DIAGNOSIS — I4819 Other persistent atrial fibrillation: Secondary | ICD-10-CM | POA: Diagnosis present

## 2023-05-11 DIAGNOSIS — Z79899 Other long term (current) drug therapy: Secondary | ICD-10-CM

## 2023-05-11 DIAGNOSIS — R001 Bradycardia, unspecified: Secondary | ICD-10-CM | POA: Diagnosis not present

## 2023-05-11 DIAGNOSIS — I959 Hypotension, unspecified: Secondary | ICD-10-CM | POA: Diagnosis present

## 2023-05-11 DIAGNOSIS — I080 Rheumatic disorders of both mitral and aortic valves: Secondary | ICD-10-CM | POA: Diagnosis present

## 2023-05-11 DIAGNOSIS — R54 Age-related physical debility: Secondary | ICD-10-CM | POA: Diagnosis present

## 2023-05-11 DIAGNOSIS — I447 Left bundle-branch block, unspecified: Secondary | ICD-10-CM | POA: Diagnosis present

## 2023-05-11 DIAGNOSIS — N179 Acute kidney failure, unspecified: Secondary | ICD-10-CM | POA: Diagnosis present

## 2023-05-11 DIAGNOSIS — E7800 Pure hypercholesterolemia, unspecified: Secondary | ICD-10-CM | POA: Diagnosis present

## 2023-05-11 DIAGNOSIS — N1832 Chronic kidney disease, stage 3b: Secondary | ICD-10-CM | POA: Diagnosis present

## 2023-05-11 DIAGNOSIS — I428 Other cardiomyopathies: Secondary | ICD-10-CM | POA: Diagnosis present

## 2023-05-11 DIAGNOSIS — I5021 Acute systolic (congestive) heart failure: Secondary | ICD-10-CM | POA: Diagnosis not present

## 2023-05-11 DIAGNOSIS — I4891 Unspecified atrial fibrillation: Secondary | ICD-10-CM | POA: Diagnosis not present

## 2023-05-11 DIAGNOSIS — Z681 Body mass index (BMI) 19 or less, adult: Secondary | ICD-10-CM

## 2023-05-11 LAB — BASIC METABOLIC PANEL
Anion gap: 9 (ref 5–15)
BUN: 24 mg/dL — ABNORMAL HIGH (ref 8–23)
CO2: 27 mmol/L (ref 22–32)
Calcium: 8.6 mg/dL — ABNORMAL LOW (ref 8.9–10.3)
Chloride: 104 mmol/L (ref 98–111)
Creatinine, Ser: 1.19 mg/dL — ABNORMAL HIGH (ref 0.44–1.00)
GFR, Estimated: 46 mL/min — ABNORMAL LOW (ref >60)
Glucose, Bld: 104 mg/dL — ABNORMAL HIGH (ref 70–99)
Potassium: 4.2 mmol/L (ref 3.5–5.1)
Sodium: 140 mmol/L (ref 135–145)

## 2023-05-11 LAB — MRSA NEXT GEN BY PCR, NASAL: MRSA by PCR Next Gen: DETECTED — AB

## 2023-05-11 LAB — HEMOGLOBIN A1C
Hgb A1c MFr Bld: 6.5 % — ABNORMAL HIGH (ref 4.8–5.6)
Mean Plasma Glucose: 140 mg/dL

## 2023-05-11 MED ORDER — ACETAMINOPHEN 325 MG PO TABS
650.0000 mg | ORAL_TABLET | ORAL | Status: DC | PRN
  Administered 2023-05-16: 325 mg via ORAL
  Filled 2023-05-11: qty 2

## 2023-05-11 MED ORDER — FUROSEMIDE 10 MG/ML IJ SOLN
40.0000 mg | Freq: Two times a day (BID) | INTRAMUSCULAR | Status: DC
  Administered 2023-05-11 – 2023-05-13 (×4): 40 mg via INTRAVENOUS
  Filled 2023-05-11 (×4): qty 4

## 2023-05-11 MED ORDER — POTASSIUM CHLORIDE CRYS ER 10 MEQ PO TBCR
10.0000 meq | EXTENDED_RELEASE_TABLET | Freq: Every day | ORAL | Status: DC
  Administered 2023-05-11 – 2023-05-14 (×4): 10 meq via ORAL
  Filled 2023-05-11 (×4): qty 1

## 2023-05-11 MED ORDER — SODIUM CHLORIDE 0.9% FLUSH
3.0000 mL | Freq: Two times a day (BID) | INTRAVENOUS | Status: DC
  Administered 2023-05-11 – 2023-05-19 (×13): 3 mL via INTRAVENOUS

## 2023-05-11 MED ORDER — SODIUM CHLORIDE 0.9 % IV SOLN: 250.0000 mL | INTRAVENOUS | Status: AC | PRN

## 2023-05-11 MED ORDER — MUPIROCIN 2 % EX OINT
1.0000 | TOPICAL_OINTMENT | Freq: Two times a day (BID) | CUTANEOUS | Status: AC
  Administered 2023-05-12 – 2023-05-16 (×10): 1 via NASAL
  Filled 2023-05-11: qty 22

## 2023-05-11 MED ORDER — APIXABAN 2.5 MG PO TABS
2.5000 mg | ORAL_TABLET | Freq: Two times a day (BID) | ORAL | Status: DC
  Administered 2023-05-11 – 2023-05-12 (×2): 2.5 mg via ORAL
  Filled 2023-05-11 (×2): qty 1

## 2023-05-11 MED ORDER — CHLORHEXIDINE GLUCONATE CLOTH 2 % EX PADS
6.0000 | MEDICATED_PAD | Freq: Every day | CUTANEOUS | Status: AC
  Administered 2023-05-13 – 2023-05-16 (×4): 6 via TOPICAL

## 2023-05-11 MED ORDER — LETROZOLE 2.5 MG PO TABS
2.5000 mg | ORAL_TABLET | Freq: Every day | ORAL | Status: DC
  Administered 2023-05-12 – 2023-05-21 (×10): 2.5 mg via ORAL
  Filled 2023-05-11 (×10): qty 1

## 2023-05-11 MED ORDER — SODIUM CHLORIDE 0.9% FLUSH: 3.0000 mL | INTRAVENOUS | Status: DC | PRN

## 2023-05-11 NOTE — Plan of Care (Signed)
  Problem: Clinical Measurements: Goal: Will remain free from infection Outcome: Progressing   Problem: Nutrition: Goal: Adequate nutrition will be maintained Outcome: Progressing   Problem: Elimination: Goal: Will not experience complications related to urinary retention Outcome: Progressing   Problem: Pain Management: Goal: General experience of comfort will improve Outcome: Progressing   Problem: Safety: Goal: Ability to remain free from injury will improve Outcome: Progressing

## 2023-05-11 NOTE — Progress Notes (Signed)
Orthopedic Tech Progress Note Patient Details:  [name] Dec 12, 1940 [id number]  Ortho Devices Type of Ortho Device: Radio broadcast assistant Ortho Device/Splint Location: Bilateral Ortho Device/Splint Interventions: Ordered, Application   Post Interventions Patient Tolerated: Well  Tara Hardin 05/11/2023, 6:13 PM

## 2023-05-11 NOTE — H&P (Addendum)
Advanced Heart Failure Team History and Physical Note   PCP:  Merri Brunette, MD  PCP-Cardiology: Verne Carrow, MD     Reason for Admission: acute on chronic systolic heart failure    HPI:    Tara Hardin is an 82 yo female with history of  persistent AF, breast cancer, aortic stenosis. LBBB and recently diagnosed systolic HF. Referred by Dr. Clifton James for further evaluation of her HF.    Echo 12/23 EF 50%. Mild MR. Mild to moderate TR. Moderate to severe AS with mean gradient 28 mmHg, AVA 0.94 cm2, SVI 47, DI 0.25.  Plan was to follow her moderate AS with repeat echo in one year.    Developed AF in 6/24 and was started on Eliquis. In October 2024 developed HF symptoms. Echo 04/05/23 EF < 20%. Severe enlargement of the left atrium. Mild to moderate MR. Moderately severe aortic stenosis with mean gradient 29 mmHg, AVA 0.82-0.88cm2, SVI 42, DI 0.32.   She saw Dr. Clifton James on 04/17/23 and volume status looked good on lasix 20 daily but BP too low to start GDMT. She was referred here for further HF evaluation.   Seen in the AHF Clinic yesterday to establish care. She is very active around the house and takes care of her bedridden husband (he had MVA years ago and has been full care). She denies CP and SOB but reports fatigue throughout the day but she tries to stay busy. She has had BLE edema for a couple of weeks now that would previously resolve with her lasix but over the last 2 weeks she has noticed progressive worsening of edema up to her hips despite doubling her Lasix. Denies dizziness. Weights at home 111-117 lbs. Watches what she's eats, tries to limit salt intake. Drinks ~48oz fluid/day.    Given severity of volume overload, low BP and fragility, decision was made from clinic to direct admit for IV diuretics.    Family history: Brother passed away, had a fib. Sister has an ICD. Sister passed away from CAD.     Review of Systems: [y] = yes, [ ]  = no   General: Weight gain  [ ] ; Weight loss [ ] ; Anorexia [ ] ; Fatigue [ Y]; Fever [ ] ; Chills [ ] ; Weakness [ ]   Cardiac: Chest pain/pressure [ ] ; Resting SOB [ ] ; Exertional SOB [ ] ; Orthopnea [ ] ; Pedal Edema [Y ]; Palpitations [ ] ; Syncope [ ] ; Presyncope [ ] ; Paroxysmal nocturnal dyspnea[ ]   Pulmonary: Cough [ ] ; Wheezing[ ] ; Hemoptysis[ ] ; Sputum [ ] ; Snoring [ ]   GI: Vomiting[ ] ; Dysphagia[ ] ; Melena[ ] ; Hematochezia [ ] ; Heartburn[ ] ; Abdominal pain [ ] ; Constipation [ ] ; Diarrhea [ ] ; BRBPR [ ]   GU: Hematuria[ ] ; Dysuria [ ] ; Nocturia[ ]   Vascular: Pain in legs with walking [ ] ; Pain in feet with lying flat [ ] ; Non-healing sores [ ] ; Stroke [ ] ; TIA [ ] ; Slurred speech [ ] ;  Neuro: Headaches[ ] ; Vertigo[ ] ; Seizures[ ] ; Paresthesias[ ] ;Blurred vision [ ] ; Diplopia [ ] ; Vision changes [ ]   Ortho/Skin: Arthritis [ ] ; Joint pain [ ] ; Muscle pain [ ] ; Joint swelling [ ] ; Back Pain [ ] ; Rash [ ]   Psych: Depression[ ] ; Anxiety[ ]   Heme: Bleeding problems [ ] ; Clotting disorders [ ] ; Anemia [ ]   Endocrine: Diabetes [ ] ; Thyroid dysfunction[ ]    Home Medications Prior to Admission medications   Medication Sig Start Date End Date Taking? Authorizing Provider  apixaban (ELIQUIS) 2.5  MG TABS tablet Take 1 tablet (2.5 mg total) by mouth 2 (two) times daily. 11/06/22   Kathleene Hazel, MD  Biotin 1 MG CAPS Take by mouth.    [provider]  furosemide (LASIX) 20 MG tablet Take 3 tablets (60 mg total) by mouth daily. May take a extra tablet for increase weight gain of 2 pounds 05/10/23   Bensimhon, Bevelyn Buckles, MD  letrozole [facility]) 2.5 MG tablet Take 1 tablet (2.5 mg total) by mouth daily. 06/12/22   Serena Croissant, MD  Multiple Vitamin (MULTIVITAMIN) tablet Take 1 tablet by mouth daily.    [provider]  Omega-3 Fatty Acids (FISH OIL) 1000 MG CAPS Take by mouth.    [provider]  OVER THE COUNTER MEDICATION Take 5,000 mcg by mouth. Garlinase    [provider]  potassium chloride  (KLOR-CON M) 10 MEQ tablet Take 10 mEq by mouth daily.    [provider]  Red Yeast Rice Extract (RED YEAST RICE PO) Take by mouth.    [provider]  triamcinolone ointment (KENALOG) 0.1 % Apply 1 application topically 2 (two) times daily. 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   Nonrheumatic tricuspid (valve) insufficiency 07/21/2020   Prediabetes 08/30/2020    Past Surgical History: Past Surgical History:  Procedure Laterality Date   BREAST LUMPECTOMY WITH RADIOACTIVE SEED LOCALIZATION Left 08/11/2021   Procedure: LEFT BREAST LUMPECTOMY WITH RADIOACTIVE SEED LOCALIZATION X2;  Surgeon: Griselda Miner, MD;  Location: [facility];  Service: General;  Laterality: Left;   RE-EXCISION OF BREAST CANCER,SUPERIOR MARGINS Left 10/07/2021   Procedure: RE-EXCISION OF LEFT BREAST INFERIOR MARGIN;  Surgeon: Griselda Miner, MD;  Location: [facility];  Service: General;  Laterality: Left;    Family History:  Family History  Problem Relation Age of Onset   Lung cancer Mother        d. 59   Heart attack Sister    Hypertension Sister    Atrial fibrillation Brother    Hypertension Brother    Heart attack Brother    Irregular heart beat Brother    Breast cancer Paternal Aunt        dx after 81   Breast cancer Cousin        paternal female cousin    Social History: Social History   Socioeconomic History   Marital status: Married    Spouse name: Not on file   Number of children: 3   Years of education: Not  on file   Highest education level: Not on file  Occupational History   Occupation: Retired Information systems manager  Tobacco Use   Smoking status: Never   Smokeless tobacco: Never  Vaping Use   Vaping status: Never Used  Substance and Sexual Activity   Alcohol use: Not Currently   Drug use: Never   Sexual activity: Not Currently    Birth control/protection: Post-menopausal  Other Topics Concern   Not on file  Social History Narrative   Not on  file   Social Drivers of Health   Financial Resource Strain: Not on file  Food Insecurity: Not on file  Transportation Needs: Not on file  Physical Activity: Not on file  Stress: Not on file  Social Connections: Not on file    Allergies:  No Known Allergies  Objective:    Vital Signs:   Temp:  [98.8 F (37.1 C)] 98.8 F (37.1 C) (12/27 1543) Resp:  [18] 18 (12/27 1543) BP: (93)/(69) 93/69 (12/27 1543)      Physical Exam     General:  elderly female No respiratory difficulty HEENT: Normal Neck: Supple. JVD elevated to ear. Carotids 2+ bilat; no bruits. No lymphadenopathy or thyromegaly appreciated. Cor: PMI nondisplaced. Irregularly irregular rhythm and rate. No rubs, gallops or murmurs. Lungs: decreased BS at the bases, R>L Abdomen: Soft, nontender, nondistended. No hepatosplenomegaly. No bruits or masses. Good bowel sounds. Extremities: No cyanosis, clubbing, rash, 2+ b/l LE up thighs Neuro: Alert & oriented x 3, cranial nerves grossly intact. moves all 4 extremities w/o difficulty. Affect pleasant.   Telemetry   Afib 90s, personally reviewed   EKG   N/A   Labs     Basic Metabolic Panel: No results for input(s): "NA", "K", "CL", "CO2", "GLUCOSE", "BUN", "CREATININE", "CALCIUM", "MG", "PHOS" in the last 168 hours.  Liver Function Tests: No results for input(s): "AST", "ALT", "ALKPHOS", "BILITOT", "PROT", "ALBUMIN" in the last 168 hours. No results for input(s): "LIPASE", "AMYLASE" in the last 168 hours. No results for  input(s): "AMMONIA" in the last 168 hours.  CBC: Recent Labs  Lab 05/10/23 1701  WBC 8.1  HGB 12.3  HCT 38.1  MCV 93.8  PLT 237    Cardiac Enzymes: No results for input(s): "CKTOTAL", "CKMB", "CKMBINDEX", "TROPONINI" in the last 168 hours.  BNP: BNP (last 3 results) Recent Labs    05/10/23 1701  BNP 4,471.5*    ProBNP (last 3 results) No results for input(s): "PROBNP" in the last 8760 hours.   CBG: No results for input(s): "GLUCAP" in the last 168 hours.  Coagulation Studies: No results for input(s): "LABPROT", "INR" in the last 72 hours.  Imaging: No results found.   Patient Profile   82 y/o female w/ h/o mod AS, MR, LBBB and atrial fibrillation, recently diagnosed w/ new systolic heart failure. Direct admitted from HF clinic for a/c CHF w/ marked volume overload.   Assessment/Plan   1. 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 Mild MR. Mild to moderate TR. Moderate to severe AS with mean gradient 28 mmHg, AVA 0.94 cm2, SVI 47, DI 0.25.   - Developed AF in 6/24 and was started on Eliquis. - Echo 04/05/23 EF < 20%. Severe enlargement of the left atrium. Mild to moderate MR. Moderately severe aortic stenosis with mean gradient 29 mmHg, AVA 0.82-0.88cm2, SVI 42, DI 0.32. 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 LBBB and PVCs may also be contributing though LBBB not that wide and no PVCs on ECG today  - NYHA II-III - Given degree of volume overload, low BP and fragility, admitted for IV Lasix. Start w/ 40 mg bid  - GDMT limited by soft BP  - While in hospital we can also follow HRs and PVC burden on tele.  - Check anemia panel with fatigue - place UNNA boots  - Consider R/L cath to exclude high-grade CAD followed by attempt at restoring NSR once diuresed -  Also consider cMRI    2. Aortic stenosis:  - followed by Dr. Clifton James - mostly in the moderate range, mG 30 mmHg on echo 11/24 - degree of  LV dysfunction out of proportion to AS   3. Mitral regurgitation: Her echo is reviewed with the structural heart imagine team and the mitral regurgitation is mild to moderate.    4. PVCs/PACs: No recent palpitations.  - may also be contributing to cardiomyopathy - plan to monitor on tele   5. Atrial fibrillation, persistent:  - Rate controlled atrial fibrillation. Question if this is contributing to her LV dysfunction given time course of events.  - Monitor on tele, plan to start amiodarone with eventual DC-CV either in hospital or as outpatient - Continue Eliquis.    Robbie Lis, PA-C 05/11/2023, 4:08 PM  Advanced Heart Failure Team Pager [phone] (M-F; 7a - 5p)  Please contact [facility] for night-coverage after hours (4p -7a ) and weekends on [email or web address]

## 2023-05-11 NOTE — Care Management (Deleted)
Transition of Care [facility]) - Inpatient Brief Assessment   Patient Details  Name: Tara Hardin MRN: [id number] Date of Birth: 1940-07-06  Transition of Care [facility]) CM/SW Contact:    Lockie Pares, RN Phone Number: 05/11/2023, 9:43 AM   Clinical Narrative: 82 year old presented in HF office yesterday with increased edema., Direct admit today for IV diuresis. 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  Family in the area.   No needs identified at this time. The patient will be discussed in progressive daily rounds. If a need is identified please place a TOC consult   Transition of Care Asessment: Insurance and Status: Insurance coverage has been reviewed   Home environment has been reviewed: Lives with husband is his caretaker Prior level of function:: Audiological scientist Home Services: No current home services Social Drivers of Health Review: SDOH reviewed no interventions necessary Readmission risk has been reviewed: Yes Transition of care needs: no transition of care needs at this time

## 2023-05-12 ENCOUNTER — Other Ambulatory Visit: Payer: Self-pay

## 2023-05-12 ENCOUNTER — Inpatient Hospital Stay (HOSPITAL_COMMUNITY): Payer: Medicare Other

## 2023-05-12 DIAGNOSIS — I5043 Acute on chronic combined systolic (congestive) and diastolic (congestive) heart failure: Secondary | ICD-10-CM | POA: Diagnosis not present

## 2023-05-12 LAB — BASIC METABOLIC PANEL
Anion gap: 10 (ref 5–15)
BUN: 27 mg/dL — ABNORMAL HIGH (ref 8–23)
CO2: 27 mmol/L (ref 22–32)
Calcium: 8.4 mg/dL — ABNORMAL LOW (ref 8.9–10.3)
Chloride: 104 mmol/L (ref 98–111)
Creatinine, Ser: 1.47 mg/dL — ABNORMAL HIGH (ref 0.44–1.00)
GFR, Estimated: 36 mL/min — ABNORMAL LOW (ref >60)
Glucose, Bld: 114 mg/dL — ABNORMAL HIGH (ref 70–99)
Potassium: 3.7 mmol/L (ref 3.5–5.1)
Sodium: 141 mmol/L (ref 135–145)

## 2023-05-12 LAB — COOXEMETRY PANEL
Carboxyhemoglobin: 0.6 % (ref 0.5–1.5)
Methemoglobin: <0.7 % (ref 0.0–1.5)
O2 Saturation: 33.2 %
Total hemoglobin: 12.3 g/dL (ref 12.0–16.0)

## 2023-05-12 MED ORDER — DIPHENHYDRAMINE HCL 12.5 MG/5ML PO ELIX
12.5000 mg | ORAL_SOLUTION | Freq: Every evening | ORAL | Status: DC | PRN
  Administered 2023-05-12 – 2023-05-19 (×6): 12.5 mg via ORAL
  Filled 2023-05-12 (×7): qty 5

## 2023-05-12 MED ORDER — POTASSIUM CHLORIDE CRYS ER 20 MEQ PO TBCR
40.0000 meq | EXTENDED_RELEASE_TABLET | Freq: Once | ORAL | Status: AC
  Administered 2023-05-12: 40 meq via ORAL
  Filled 2023-05-12: qty 2

## 2023-05-12 MED ORDER — SODIUM CHLORIDE 0.9% FLUSH: 10.0000 mL | INTRAVENOUS | Status: DC | PRN

## 2023-05-12 MED ORDER — CHLORHEXIDINE GLUCONATE CLOTH 2 % EX PADS
6.0000 | MEDICATED_PAD | Freq: Every day | CUTANEOUS | Status: AC
  Administered 2023-05-12: 6 via TOPICAL

## 2023-05-12 MED ORDER — HEPARIN (PORCINE) 25000 UT/250ML-% IV SOLN
900.0000 [IU]/h | INTRAVENOUS | Status: DC
  Administered 2023-05-12: 700 [IU]/h via INTRAVENOUS
  Administered 2023-05-13: 800 [IU]/h via INTRAVENOUS
  Administered 2023-05-14: 900 [IU]/h via INTRAVENOUS
  Filled 2023-05-12 (×2): qty 250

## 2023-05-12 MED ORDER — SODIUM CHLORIDE 0.9% FLUSH
10.0000 mL | Freq: Two times a day (BID) | INTRAVENOUS | Status: DC
Start: 2023-05-12 — End: 2023-05-21
  Administered 2023-05-12 – 2023-05-20 (×15): 10 mL

## 2023-05-12 MED ORDER — AMIODARONE HCL 200 MG PO TABS
400.0000 mg | ORAL_TABLET | Freq: Two times a day (BID) | ORAL | Status: DC
  Administered 2023-05-12 – 2023-05-16 (×10): 400 mg via ORAL
  Filled 2023-05-12 (×10): qty 2

## 2023-05-12 NOTE — Plan of Care (Signed)

## 2023-05-12 NOTE — Progress Notes (Signed)
Patient ID: Tara Hardin, female   DOB: Oct 06, 1940, 82 y.o.   MRN: [id number]     Advanced Heart Failure Rounding Note  Cardiologist: Verne Carrow, MD   Subjective:   Chief Complaint: shortness of breath  Diuresing with IV Lasix.  Creatinine higher at 1.47.  No dyspnea at rest.   She remains in atrial fibrillation, rate controlled.    Objective:   Weight Range: 50.7 kg Body mass index is 19.17 kg/m.   Vital Signs:   Temp:  [97.5 F (36.4 C)-98.9 F (37.2 C)] 98.9 F (37.2 C) (12/28 1052) Pulse Rate:  [68-92] 87 (12/28 1052) Resp:  [14-19] 14 (12/28 1052) BP: (91-113)/(61-100) 91/61 (12/28 1052) SpO2:  [95 %-100 %] 95 % (12/28 1052) Weight:  [50.7 kg] 50.7 kg (12/28 0415) Last BM Date : 05/11/23  Weight change: Filed Weights   05/12/23 0415  Weight: 50.7 kg    Intake/Output:   Intake/Output Summary (Last 24 hours) at 05/12/2023 1151 Last data filed at 05/12/2023 0423 Gross per 24 hour  Intake 240 ml  Output 1201 ml  Net -961 ml      Physical Exam    General:  Thin HEENT: Normal Neck: Supple. JVP 12 cm. Carotids 2+ bilat; no bruits. No lymphadenopathy or thyromegaly appreciated. Cor: PMI nondisplaced. Irregular rate & rhythm. 2/6 SEM RUSB. Lungs: Clear Abdomen: Soft, nontender, nondistended. No hepatosplenomegaly. No bruits or masses. Good bowel sounds. Extremities: No cyanosis, clubbing, rash. 1+ edema to knees.  Neuro: Alert & orientedx3, cranial nerves grossly intact. moves all 4 extremities w/o difficulty. Affect pleasant   Telemetry   Atrial fibrillation rate 70s (personally reviewed)   Labs    CBC Recent Labs    05/10/23 1701  WBC 8.1  HGB 12.3  HCT 38.1  MCV 93.8  PLT 237   Basic Metabolic Panel Recent Labs    56/21/30 1712 05/12/23 0209  NA 140 141  K 4.2 3.7  CL 104 104  CO2 27 27  GLUCOSE 104* 114*  BUN 24* 27*  CREATININE 1.19* 1.47*  CALCIUM 8.6* 8.4*   Liver Function Tests No results for input(s): "AST",  "ALT", "ALKPHOS", "BILITOT", "PROT", "ALBUMIN" in the last 72 hours. No results for input(s): "LIPASE", "AMYLASE" in the last 72 hours. Cardiac Enzymes No results for input(s): "CKTOTAL", "CKMB", "CKMBINDEX", "TROPONINI" in the last 72 hours.  BNP: BNP (last 3 results) Recent Labs    05/10/23 1701  BNP 4,471.5*    ProBNP (last 3 results) No results for input(s): "PROBNP" in the last 8760 hours.   D-Dimer No results for input(s): "DDIMER" in the last 72 hours. Hemoglobin A1C Recent Labs    05/10/23 1701  HGBA1C 6.5*   Fasting Lipid Panel No results for input(s): "CHOL", "HDL", "LDLCALC", "TRIG", "CHOLHDL", "LDLDIRECT" in the last 72 hours. Thyroid Function Tests No results for input(s): "TSH", "T4TOTAL", "T3FREE", "THYROIDAB" in the last 72 hours.  Invalid input(s): "FREET3"  Other results:   Imaging    [place] EKG SITE RITE Result Date: 05/12/2023 If Site Rite image not attached, placement could not be confirmed due to current cardiac rhythm.  DG Chest Port 1 View Result Date: 05/12/2023 CLINICAL DATA:  Chronic systolic congestive heart failure. EXAM: PORTABLE CHEST 1 VIEW COMPARISON:  None Available. FINDINGS: Mild cardiomegaly is noted. Right lung is clear. Minimal left basilar atelectasis or possibly infiltrate is noted with possible small left pleural effusion. Bony thorax is unremarkable. IMPRESSION: Minimal left basilar atelectasis or possibly infiltrate is noted with possible small  left pleural effusion. Electronically Signed   By: Lupita Raider M.D.   On: 05/12/2023 08:52     Medications:     Scheduled Medications:  amiodarone  400 mg Oral BID   Chlorhexidine Gluconate Cloth  6 each Topical Daily   furosemide  40 mg Intravenous BID   letrozole  2.5 mg Oral Daily   mupirocin ointment  1 Application Nasal BID   potassium chloride  10 mEq Oral Daily   potassium chloride  40 mEq Oral Once   sodium chloride flush  3 mL Intravenous Q12H    Infusions:   sodium chloride      PRN Medications: sodium chloride, acetaminophen, diphenhydrAMINE, sodium chloride flush    Assessment/Plan   1. Acute systolic CHF: Echo in 12/23 with EF 50%, moderate-severe AS.  Developed atrial fibrillation in 6/24 but rate has been controlled.  I reviewed the echo done 11/24, this shows EF 20-25% with septal-lateral dyssynchrony consistent with LBBB, mild RV dysfunction, possible low flow/low gradient severe AS with mean gradient 34 mmHg and AVA 0.83 cm^2, mild-moderate mitral regurgitation, dilated IVC.  Cause of drop in EF is uncertain.  AS does not appear critical.  She is in atrial fibrillation but rate has not been uncontrolled. She has LBBB but not markedly wide. Need to rule out CAD as cause of CMP.  She was admitted with volume overload/decompensated HF.  She has diuresed some overnight with IV Lasix but creatinine up to 1.47.  She still appears to have some JVD.  - With rise in creatinine, will place PICC for closer monitoring.  Follow CVP and co-ox.  - Continue Lasix 40 mg IV bid today.  - Will plan LHC/RHC Monday if creatinine remains stable. Discussed risks/benefits with patient and she agrees to procedure.  - GDMT limited by soft BP and elevated creatinine.  - Will ultimately need conversion to NSR.  2. Atrial fibrillation: Persistent since at least 6/24.  She has been on Eliquis but says she has missed doses occasionally.  HR 70s today.  She has a severely dilated LA, doubt she will hold NSR without anti-arrhythmic.  - Start amiodarone 400 mg bid pre-DCCV.  - Transition to heparin gtt for now (pre-cath).   - Will need TEE-guided DCCV after cath (has missed Eliquis doses prior to admission).  3. Aortic stenosis: I reviewed echo from 11/24.  Possible low flow/low gradient severe AS with mean gradient 34 mmHg and AVA 0.83.  Will need eventual evaluation for TAVR.   4. Mitral regurgitation: Reported as severe on 11/24 echo but looks mild-moderate to me.  5.  PVCs: Occasionally on telemetry.  Have started amiodarone.  6. AKI: Creatinine up to 1.47 today with diuresis.  Will continue IV Lasix today but will place PICC for close monitoring.   Marca Ancona 05/12/2023 12:01 PM   Length of Stay: 1  Marca Ancona, MD  05/12/2023, 11:51 AM  Advanced Heart Failure Team Pager [phone] (M-F; 7a - 5p)  Please contact [facility] for night-coverage after hours (5p -7a ) and weekends on [email or web address]

## 2023-05-12 NOTE — Progress Notes (Signed)
PHARMACY - ANTICOAGULATION CONSULT NOTE  Pharmacy Consult for heparin Indication: atrial fibrillation  No Known Allergies  Patient Measurements: Height: 5\' 4"  (162.6 cm) Weight: 50.7 kg (111 lb 11.2 oz) IBW/kg (Calculated) : 54.7 Heparin Dosing Weight: 50.7 kg   Vital Signs: Temp: 98.9 F (37.2 C) (12/28 1052) Temp Source: Oral (12/28 1052) BP: 91/61 (12/28 1052) Pulse Rate: 87 (12/28 1052)  Labs: Recent Labs    05/10/23 1701 05/11/23 1712 05/12/23 0209  HGB 12.3  --   --   HCT 38.1  --   --   PLT 237  --   --   CREATININE  --  1.19* 1.47*    Estimated Creatinine Clearance: 24 mL/min (A) (by C-G formula based on SCr of 1.47 mg/dL (H)).   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 On apixaban PTA for hx Afib (LD 12/28@0849 ). Changing to IV heparin for possible cardiac cath.   Hgb 12.3, plt 237 yesterday. Given recent DOAC today will start heparin 12 hours after last dose. Will monitor both aPTT and heparin level until correlate. No s/sx of bleeding noted.   Goal of Therapy:  Heparin level  0.3-0.7 units/ml aPTT 66-102 seconds Monitor platelets by anticoagulation protocol: Yes   Plan:  Stop apixaban  Start heparin infusion at 700 units/hr on 12/28@2100  Order heparin level and aPTT 8 hr after start Monitor daily aPTT/HL until correlate, CBC, and for s/sx of bleeding   Thank you for allowing pharmacy to participate in this patient's care,  Sherron Monday, PharmD, BCCCP Clinical Pharmacist  Phone: [phone] 05/12/2023 11:59 AM  Please check AMION for all [facility] phone numbers After 10:00 PM, call Main Pharmacy [phone]

## 2023-05-12 NOTE — Progress Notes (Signed)
Peripherally Inserted Central Catheter Placement  The IV Nurse has discussed with the patient and/or persons authorized to consent for the patient, the purpose of this procedure and the potential benefits and risks involved with this procedure.  The benefits include less needle sticks, lab draws from the catheter, and the patient may be discharged home with the catheter. Risks include, but not limited to, infection, bleeding, blood clot (thrombus formation), and puncture of an artery; nerve damage and irregular heartbeat and possibility to perform a PICC exchange if needed/ordered by physician.  Alternatives to this procedure were also discussed.  Bard Power PICC patient education guide, fact sheet on infection prevention and patient information card has been provided to patient /or left at bedside.    PICC Placement Documentation  PICC Double Lumen 05/12/23 Right Basilic 36 cm 0 cm (Active)  Indication for Insertion or Continuance of Line Vasoactive infusions 05/12/23 1701  Exposed Catheter (cm) 0 cm 05/12/23 1701  Site Assessment Clean, Dry, Intact 05/12/23 1701  Lumen #2 Status Saline locked;Flushed;Blood return noted 05/12/23 1701  Dressing Type Transparent;Securing device 05/12/23 1701  Dressing Status Antimicrobial disc in place;Clean, Dry, Intact 05/12/23 1701  Line Care Connections checked and tightened 05/12/23 1701  Line Adjustment (NICU/IV Team Only) No 05/12/23 1701  Dressing Intervention New dressing;Adhesive placed at insertion site (IV team only);Adhesive placed around edges of dressing (IV team/ICU RN only) 05/12/23 1701  Dressing Change Due 05/19/23 05/12/23 1701       Elliot Dally 05/12/2023, 5:01 PM

## 2023-05-13 DIAGNOSIS — I5043 Acute on chronic combined systolic (congestive) and diastolic (congestive) heart failure: Secondary | ICD-10-CM | POA: Diagnosis not present

## 2023-05-13 LAB — BASIC METABOLIC PANEL
Anion gap: 9 (ref 5–15)
BUN: 28 mg/dL — ABNORMAL HIGH (ref 8–23)
CO2: 28 mmol/L (ref 22–32)
Calcium: 8.1 mg/dL — ABNORMAL LOW (ref 8.9–10.3)
Chloride: 103 mmol/L (ref 98–111)
Creatinine, Ser: 1.27 mg/dL — ABNORMAL HIGH (ref 0.44–1.00)
GFR, Estimated: 42 mL/min — ABNORMAL LOW (ref >60)
Glucose, Bld: 117 mg/dL — ABNORMAL HIGH (ref 70–99)
Potassium: 4 mmol/L (ref 3.5–5.1)
Sodium: 140 mmol/L (ref 135–145)

## 2023-05-13 LAB — COOXEMETRY PANEL
Carboxyhemoglobin: 1.3 % (ref 0.5–1.5)
Carboxyhemoglobin: 1.5 % (ref 0.5–1.5)
Methemoglobin: 1.1 % (ref 0.0–1.5)
Methemoglobin: 1.4 % (ref 0.0–1.5)
O2 Saturation: 40.1 %
O2 Saturation: 60.1 %
Total hemoglobin: 11.7 g/dL — ABNORMAL LOW (ref 12.0–16.0)
Total hemoglobin: 12 g/dL (ref 12.0–16.0)

## 2023-05-13 LAB — APTT
aPTT: 162 s (ref 24–36)
aPTT: 54 s — ABNORMAL HIGH (ref 24–36)
aPTT: 50 s — ABNORMAL HIGH (ref 24–36)

## 2023-05-13 LAB — HEPARIN LEVEL (UNFRACTIONATED)
Heparin Unfractionated: >1.1 [IU]/mL — ABNORMAL HIGH (ref 0.30–0.70)
Heparin Unfractionated: 0.77 [IU]/mL — ABNORMAL HIGH (ref 0.30–0.70)

## 2023-05-13 MED ORDER — MILRINONE LACTATE IN DEXTROSE 20-5 MG/100ML-% IV SOLN
0.1250 ug/kg/min | INTRAVENOUS | Status: DC
  Administered 2023-05-13 – 2023-05-14 (×4): 0.25 ug/kg/min via INTRAVENOUS
  Filled 2023-05-13 (×3): qty 100

## 2023-05-13 MED ORDER — ASPIRIN 81 MG PO CHEW
81.0000 mg | CHEWABLE_TABLET | ORAL | Status: AC
  Administered 2023-05-14: 81 mg via ORAL
  Filled 2023-05-13: qty 1

## 2023-05-13 MED ORDER — SODIUM CHLORIDE 0.9 % IV SOLN: INTRAVENOUS | Status: DC

## 2023-05-13 MED ORDER — DIGOXIN 125 MCG PO TABS
0.0625 mg | ORAL_TABLET | Freq: Every day | ORAL | Status: DC
  Administered 2023-05-13 – 2023-05-16 (×4): 0.0625 mg via ORAL
  Filled 2023-05-13 (×5): qty 1

## 2023-05-13 NOTE — TOC Progression Note (Signed)
Transition of Care [facility]) - Progression Note    Patient Details  Name: Tara Hardin MRN: [id number] Date of Birth: August 15, 1940  [facility] CM/SW Contact  Ronny Bacon, RN Phone Number: 05/13/2023, 11:19 AM  Clinical Narrative:     Secure chat received from PT regarding patient requesting resources for help around the house. Patient given resources for home health aide services. Patient reports that she had an aide from [place] but was dissatisfied with the care received.       Expected Discharge Plan and Services                                               Social Determinants of Health (SDOH) Interventions SDOH Screenings   Food Insecurity: No Food Insecurity (05/11/2023)  Housing: Low Risk  (05/11/2023)  Transportation Needs: No Transportation Needs (05/11/2023)  Utilities: Not At Risk (05/11/2023)  Tobacco Use: Low Risk  (05/11/2023)    Readmission Risk Interventions     No data to display

## 2023-05-13 NOTE — Progress Notes (Addendum)
PHARMACY - ANTICOAGULATION CONSULT NOTE  Pharmacy Consult for heparin Indication: atrial fibrillation  No Known Allergies  Patient Measurements: Height: 5\' 4"  (162.6 cm) Weight: 49.3 kg (108 lb 11 oz) IBW/kg (Calculated) : 54.7 Heparin Dosing Weight: 50.7 kg   Vital Signs: Temp: 98.7 F (37.1 C) (12/29 0448) Temp Source: Oral (12/29 0448) BP: 89/57 (12/29 0448) Pulse Rate: 55 (12/29 0448)  Labs: Recent Labs    05/10/23 1701 05/11/23 1712 05/12/23 0209 05/13/23 0505 05/13/23 0506  HGB 12.3  --   --   --   --   HCT 38.1  --   --   --   --   PLT 237  --   --   --   --   APTT  --   --   --  162*  --   HEPARINUNFRC  --   --   --   --  >1.10*  CREATININE  --  1.19* 1.47* 1.27*  --     Estimated Creatinine Clearance: 27 mL/min (A) (by C-G formula based on SCr of 1.27 mg/dL (H)).   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 On apixaban PTA for hx Afib (LD 12/28@0849 ). Changing to IV heparin for possible cardiac cath.   Hgb 12.3, plt 237 yesterday. Given recent DOAC today will  start heparin 12 hours after last dose. Will monitor both aPTT and heparin level until correlate. No s/sx of bleeding noted.   12/29 AM update:  aPTT drawn from same line as heparin infusion  Goal of Therapy:  Heparin level 0.3-0.7 units/ml aPTT 66-102 seconds Monitor platelets by anticoagulation protocol: Yes   Plan:  Will re-drawn heparin level and aPTT to verify  Abran Duke, PharmD, BCPS Clinical Pharmacist Phone: [phone]

## 2023-05-13 NOTE — H&P (View-Only) (Signed)
Patient ID: Tara Hardin, female   DOB: [birth year], 82 y.o.   MRN: [id number]     Advanced Heart Failure Rounding Note  Cardiologist: Verne Carrow, MD   Subjective:   Chief Complaint: shortness of breath  Diuresing with IV Lasix, I/Os net negative 2145 and weight down 3 lbs.  Creatinine 1.47 => 1.27.  This morning, CVP 3 and co-ox 40%. She is now on milrinone 0.25.     She remains in atrial fibrillation, rate controlled.   Breathing better, less leg edema.    Objective:   Weight Range: 49.3 kg Body mass index is 18.66 kg/m.   Vital Signs:   Temp:  [98.4 F (36.9 C)-98.7 F (37.1 C)] 98.7 F (37.1 C) (12/29 0723) Pulse Rate:  [55] 55 (12/29 0448) Resp:  [16-19] 19 (12/29 0723) BP: (84-114)/(50-81) 100/81 (12/29 0920) SpO2:  [98 %-99 %] 98 % (12/29 0833) Weight:  [49.3 kg] 49.3 kg (12/29 0448) Last BM Date : 05/11/23  Weight change: Filed Weights   05/12/23 0415 05/13/23 0448  Weight: 50.7 kg 49.3 kg    Intake/Output:   Intake/Output Summary (Last 24 hours) at 05/13/2023 1114 Last data filed at 05/13/2023 0912 Gross per 24 hour  Intake 294.91 ml  Output 2400 ml  Net -2105.09 ml      Physical Exam    General: NAD, thin Neck: No JVD, no thyromegaly or thyroid nodule.  Lungs: Clear to auscultation bilaterally with normal respiratory effort. CV: Nondisplaced PMI.  Heart irregular S1/S2, no S3/S4, 2/6 SEM RUSB.  No peripheral edema.   Abdomen: Soft, nontender, no hepatosplenomegaly, no distention.  Skin: Intact without lesions or rashes.  Neurologic: Alert and oriented x 3.  Psych: Normal affect. Extremities: No clubbing or cyanosis.  HEENT: Normal.   Telemetry   Atrial fibrillation rate 70s (personally reviewed)   Labs    CBC Recent Labs    05/10/23 1701  WBC 8.1  HGB 12.3  HCT 38.1  MCV 93.8  PLT 237   Basic Metabolic Panel Recent Labs    57/84/69 0209 05/13/23 0505  NA 141 140  K 3.7 4.0  CL 104 103  CO2 27 28  GLUCOSE 114*  117*  BUN 27* 28*  CREATININE 1.47* 1.27*  CALCIUM 8.4* 8.1*   Liver Function Tests No results for input(s): "AST", "ALT", "ALKPHOS", "BILITOT", "PROT", "ALBUMIN" in the last 72 hours. No results for input(s): "LIPASE", "AMYLASE" in the last 72 hours. Cardiac Enzymes No results for input(s): "CKTOTAL", "CKMB", "CKMBINDEX", "TROPONINI" in the last 72 hours.  BNP: BNP (last 3 results) Recent Labs    05/10/23 1701  BNP 4,471.5*    ProBNP (last 3 results) No results for input(s): "PROBNP" in the last 8760 hours.   D-Dimer No results for input(s): "DDIMER" in the last 72 hours. Hemoglobin A1C Recent Labs    05/10/23 1701  HGBA1C 6.5*   Fasting Lipid Panel No results for input(s): "CHOL", "HDL", "LDLCALC", "TRIG", "CHOLHDL", "LDLDIRECT" in the last 72 hours. Thyroid Function Tests No results for input(s): "TSH", "T4TOTAL", "T3FREE", "THYROIDAB" in the last 72 hours.  Invalid input(s): "FREET3"  Other results:   Imaging    DG CHEST PORT 1 VIEW Result Date: 05/12/2023 CLINICAL DATA:  Check PICC placement EXAM: PORTABLE CHEST 1 VIEW COMPARISON:  05/12/2023 FINDINGS: Cardiac shadow remains enlarged. Aortic calcifications are again seen. Right PICC is noted with catheter tip at the cavoatrial junction in satisfactory position. No focal confluent infiltrate is seen. No bony abnormality is noted. IMPRESSION:  Right PICC in satisfactory position. Electronically Signed   By: Alcide Clever M.D.   On: 05/12/2023 18:28   [place] EKG SITE RITE Result Date: 05/12/2023 If Site Rite image not attached, placement could not be confirmed due to current cardiac rhythm.    Medications:     Scheduled Medications:  amiodarone  400 mg Oral BID   Chlorhexidine Gluconate Cloth  6 each Topical Daily   digoxin  0.0625 mg Oral Daily   letrozole  2.5 mg Oral Daily   mupirocin ointment  1 Application Nasal BID   potassium chloride  10 mEq Oral Daily   sodium chloride flush  10-40 mL Intracatheter  Q12H   sodium chloride flush  3 mL Intravenous Q12H    Infusions:  heparin 800 Units/hr (05/13/23 0929)   milrinone 0.25 mcg/kg/min (05/13/23 0924)    PRN Medications: acetaminophen, diphenhydrAMINE, sodium chloride flush, sodium chloride flush    Assessment/Plan   1. Acute systolic CHF: Echo in 12/23 with EF 50%, moderate-severe AS.  Developed atrial fibrillation in 6/24 but rate has been controlled.  I reviewed the echo done 11/24, this shows EF 20-25% with septal-lateral dyssynchrony consistent with LBBB, mild RV dysfunction, possible low flow/low gradient severe AS with mean gradient 34 mmHg and AVA 0.83 cm^2, mild-moderate mitral regurgitation, dilated IVC.  Cause of drop in EF is uncertain.  AS does not appear critical.  She is in atrial fibrillation but rate has not been uncontrolled. She has LBBB but not markedly wide. Need to rule out CAD as cause of CMP.  She was admitted with volume overload/decompensated HF. She has diuresed, well, weight down 3 lbs.  CVP 3 this morning.  Co-ox 40%, now on milrinone 0.25.   - Stop Lasix, had dose earlier this morning.  - Continue milrinone 0.25 for now.  - Will plan LHC/RHC Monday if creatinine remains stable. Discussed risks/benefits with patient and she agrees to procedure.  - GDMT limited by soft BP and elevated creatinine.  WIll add low dose digoxin 0.0625, continue as long as renal function remains stable.  - Will ultimately need conversion to NSR.  2. Atrial fibrillation: Persistent since at least 6/24.  She has been on Eliquis but says she has missed doses occasionally.  HR 70s today.  She has a severely dilated LA, doubt she will hold NSR without anti-arrhythmic.  - Continue amiodarone 400 mg bid pre-DCCV.  - Transition to heparin gtt for now (pre-cath).   - Will need TEE-guided DCCV after cath (has missed Eliquis doses prior to admission).  3. Aortic stenosis: I reviewed echo from 11/24.  Suspect low flow/low gradient severe AS with  mean gradient 34 mmHg and AVA 0.83.  Will need eventual evaluation for TAVR.   4. Mitral regurgitation: Reported as severe on 11/24 echo but looks mild-moderate to me.  5. PVCs: Occasionally on telemetry.  Have started amiodarone.  6. AKI: Creatinine 1.47 => 1.27.  Stopping Lasix today.   Marca Ancona 05/13/2023 11:14 AM   Length of Stay: 2  Marca Ancona, MD  05/13/2023, 11:14 AM  Advanced Heart Failure Team Pager [phone] (M-F; 7a - 5p)  Please contact [facility] for night-coverage after hours (5p -7a ) and weekends on [email or web address]

## 2023-05-13 NOTE — Progress Notes (Addendum)
MD placed an order for Milrinone. Pt has soft BP this morning 84/50 (61). Asymptomatic, CVP 5-6. MD made aware. No new orders as of right now. Will hold off starting this new medicine.   0900: Okay to start milrinone per MD.

## 2023-05-13 NOTE — Progress Notes (Signed)
Patient ID: Tara Hardin, female   DOB: [birth year], 82 y.o.   MRN: [id number]     Advanced Heart Failure Rounding Note  Cardiologist: Verne Carrow, MD   Subjective:   Chief Complaint: shortness of breath  Diuresing with IV Lasix, I/Os net negative 2145 and weight down 3 lbs.  Creatinine 1.47 => 1.27.  This morning, CVP 3 and co-ox 40%. She is now on milrinone 0.25.     She remains in atrial fibrillation, rate controlled.   Breathing better, less leg edema.    Objective:   Weight Range: 49.3 kg Body mass index is 18.66 kg/m.   Vital Signs:   Temp:  [98.4 F (36.9 C)-98.7 F (37.1 C)] 98.7 F (37.1 C) (12/29 0723) Pulse Rate:  [55] 55 (12/29 0448) Resp:  [16-19] 19 (12/29 0723) BP: (84-114)/(50-81) 100/81 (12/29 0920) SpO2:  [98 %-99 %] 98 % (12/29 0833) Weight:  [49.3 kg] 49.3 kg (12/29 0448) Last BM Date : 05/11/23  Weight change: Filed Weights   05/12/23 0415 05/13/23 0448  Weight: 50.7 kg 49.3 kg    Intake/Output:   Intake/Output Summary (Last 24 hours) at 05/13/2023 1114 Last data filed at 05/13/2023 0912 Gross per 24 hour  Intake 294.91 ml  Output 2400 ml  Net -2105.09 ml      Physical Exam    General: NAD, thin Neck: No JVD, no thyromegaly or thyroid nodule.  Lungs: Clear to auscultation bilaterally with normal respiratory effort. CV: Nondisplaced PMI.  Heart irregular S1/S2, no S3/S4, 2/6 SEM RUSB.  No peripheral edema.   Abdomen: Soft, nontender, no hepatosplenomegaly, no distention.  Skin: Intact without lesions or rashes.  Neurologic: Alert and oriented x 3.  Psych: Normal affect. Extremities: No clubbing or cyanosis.  HEENT: Normal.   Telemetry   Atrial fibrillation rate 70s (personally reviewed)   Labs    CBC Recent Labs    05/10/23 1701  WBC 8.1  HGB 12.3  HCT 38.1  MCV 93.8  PLT 237   Basic Metabolic Panel Recent Labs    57/84/69 0209 05/13/23 0505  NA 141 140  K 3.7 4.0  CL 104 103  CO2 27 28  GLUCOSE 114*  117*  BUN 27* 28*  CREATININE 1.47* 1.27*  CALCIUM 8.4* 8.1*   Liver Function Tests No results for input(s): "AST", "ALT", "ALKPHOS", "BILITOT", "PROT", "ALBUMIN" in the last 72 hours. No results for input(s): "LIPASE", "AMYLASE" in the last 72 hours. Cardiac Enzymes No results for input(s): "CKTOTAL", "CKMB", "CKMBINDEX", "TROPONINI" in the last 72 hours.  BNP: BNP (last 3 results) Recent Labs    05/10/23 1701  BNP 4,471.5*    ProBNP (last 3 results) No results for input(s): "PROBNP" in the last 8760 hours.   D-Dimer No results for input(s): "DDIMER" in the last 72 hours. Hemoglobin A1C Recent Labs    05/10/23 1701  HGBA1C 6.5*   Fasting Lipid Panel No results for input(s): "CHOL", "HDL", "LDLCALC", "TRIG", "CHOLHDL", "LDLDIRECT" in the last 72 hours. Thyroid Function Tests No results for input(s): "TSH", "T4TOTAL", "T3FREE", "THYROIDAB" in the last 72 hours.  Invalid input(s): "FREET3"  Other results:   Imaging    DG CHEST PORT 1 VIEW Result Date: 05/12/2023 CLINICAL DATA:  Check PICC placement EXAM: PORTABLE CHEST 1 VIEW COMPARISON:  05/12/2023 FINDINGS: Cardiac shadow remains enlarged. Aortic calcifications are again seen. Right PICC is noted with catheter tip at the cavoatrial junction in satisfactory position. No focal confluent infiltrate is seen. No bony abnormality is noted. IMPRESSION:  Right PICC in satisfactory position. Electronically Signed   By: Alcide Clever M.D.   On: 05/12/2023 18:28   [place] EKG SITE RITE Result Date: 05/12/2023 If Site Rite image not attached, placement could not be confirmed due to current cardiac rhythm.    Medications:     Scheduled Medications:  amiodarone  400 mg Oral BID   Chlorhexidine Gluconate Cloth  6 each Topical Daily   digoxin  0.0625 mg Oral Daily   letrozole  2.5 mg Oral Daily   mupirocin ointment  1 Application Nasal BID   potassium chloride  10 mEq Oral Daily   sodium chloride flush  10-40 mL Intracatheter  Q12H   sodium chloride flush  3 mL Intravenous Q12H    Infusions:  heparin 800 Units/hr (05/13/23 0929)   milrinone 0.25 mcg/kg/min (05/13/23 0924)    PRN Medications: acetaminophen, diphenhydrAMINE, sodium chloride flush, sodium chloride flush    Assessment/Plan   1. Acute systolic CHF: Echo in 12/23 with EF 50%, moderate-severe AS.  Developed atrial fibrillation in 6/24 but rate has been controlled.  I reviewed the echo done 11/24, this shows EF 20-25% with septal-lateral dyssynchrony consistent with LBBB, mild RV dysfunction, possible low flow/low gradient severe AS with mean gradient 34 mmHg and AVA 0.83 cm^2, mild-moderate mitral regurgitation, dilated IVC.  Cause of drop in EF is uncertain.  AS does not appear critical.  She is in atrial fibrillation but rate has not been uncontrolled. She has LBBB but not markedly wide. Need to rule out CAD as cause of CMP.  She was admitted with volume overload/decompensated HF. She has diuresed, well, weight down 3 lbs.  CVP 3 this morning.  Co-ox 40%, now on milrinone 0.25.   - Stop Lasix, had dose earlier this morning.  - Continue milrinone 0.25 for now.  - Will plan LHC/RHC Monday if creatinine remains stable. Discussed risks/benefits with patient and she agrees to procedure.  - GDMT limited by soft BP and elevated creatinine.  WIll add low dose digoxin 0.0625, continue as long as renal function remains stable.  - Will ultimately need conversion to NSR.  2. Atrial fibrillation: Persistent since at least 6/24.  She has been on Eliquis but says she has missed doses occasionally.  HR 70s today.  She has a severely dilated LA, doubt she will hold NSR without anti-arrhythmic.  - Continue amiodarone 400 mg bid pre-DCCV.  - Transition to heparin gtt for now (pre-cath).   - Will need TEE-guided DCCV after cath (has missed Eliquis doses prior to admission).  3. Aortic stenosis: I reviewed echo from 11/24.  Suspect low flow/low gradient severe AS with  mean gradient 34 mmHg and AVA 0.83.  Will need eventual evaluation for TAVR.   4. Mitral regurgitation: Reported as severe on 11/24 echo but looks mild-moderate to me.  5. PVCs: Occasionally on telemetry.  Have started amiodarone.  6. AKI: Creatinine 1.47 => 1.27.  Stopping Lasix today.   Marca Ancona 05/13/2023 11:14 AM   Length of Stay: 2  Marca Ancona, MD  05/13/2023, 11:14 AM  Advanced Heart Failure Team Pager [phone] (M-F; 7a - 5p)  Please contact [facility] for night-coverage after hours (5p -7a ) and weekends on [email or web address]

## 2023-05-13 NOTE — Evaluation (Signed)
Physical Therapy Evaluation Patient Details Name: Tara Hardin MRN: [id number] DOB: [birth year] Today's Date: 05/13/2023  History of Present Illness  Pt is 82 yo presenting to [facility] for evaluation of HF. PMH: a-fib, breast cancer, aortic stenosis, LBBB and systolic heart failure.  Clinical Impression  Pt is presenting slightly below baseline at this time. Mod I with rollator for transfers and gait. Pt reports she is moving slightly slower than she normally does. Pt cares for spouse and is getting very fatigued. Pt has significant kyphosis that she is able to correct when cued due to weakness and fatigue. Due to pt current functional status, home set up and available assistance at home recommending skilled physical therapy services 3x/week in order to address strength, balance and functional mobility to decrease risk for falls, injury and re-hospitalization.           If plan is discharge home, recommend the following: Help with stairs or ramp for entrance;Assistance with cooking/housework     Equipment Recommendations Rollator (4 wheels)     Functional Status Assessment Patient has had a recent decline in their functional status and demonstrates the ability to make significant improvements in function in a reasonable and predictable amount of time.     Precautions / Restrictions Precautions Precautions: None Restrictions Weight Bearing Restrictions Per Provider Order: No      Mobility  Bed Mobility Overal bed mobility: Modified Independent      Transfers Overall transfer level: Modified independent Equipment used: Rollator (4 wheels)      Ambulation/Gait Ambulation/Gait assistance: Modified independent (Device/Increase time) Gait Distance (Feet): 300 Feet Assistive device: Rolling walker (2 wheels) Gait Pattern/deviations: Step-through pattern Gait velocity: decreased Gait velocity interpretation: 1.31 - 2.62 ft/sec, indicative of limited community ambulator   General  Gait Details: pt has weakness in upper back with intermittent kyphotic posture.  Stairs Stairs:  (pt states sheonly has a 1 step and can hold onto door frame.)             Balance Overall balance assessment: Modified Independent         Pertinent Vitals/Pain Pain Assessment Pain Assessment: No/denies pain    Home Living Family/patient expects to be discharged to:: Private residence Living Arrangements: Spouse/significant other Available Help at Discharge: Family (daughter lives in [place] with spouse with health problems, church helps with meals 2x/month) Type of Home: House Home Access: Stairs to enter Entrance Stairs-Rails: None Entrance Stairs-Number of Steps: 2   Home Layout: One level Home Equipment: Rollator (4 wheels);Transport chair;Wheelchair - manual;BSC/3in1;Standard Environmental consultant;Shower seat - built in;Grab bars - tub/shower;Hand held shower head Additional Comments: cares for spouse    Prior Function Prior Level of Function : Independent/Modified Independent;Driving             Mobility Comments: Pt ambulates with rollator due to back and hip pain. ADLs Comments: Pt church helps with meals 2 wednesdays a month, states laundry and cleaning is backed up.     Extremity/Trunk Assessment   Upper Extremity Assessment Upper Extremity Assessment: Overall WFL for tasks assessed    Lower Extremity Assessment Lower Extremity Assessment: Overall WFL for tasks assessed    Cervical / Trunk Assessment Cervical / Trunk Assessment: Kyphotic  Communication   Communication Communication: No apparent difficulties Cueing Techniques: Verbal cues  Cognition Arousal: Alert   Overall Cognitive Status: Within Functional Limits for tasks assessed          General Comments General comments (skin integrity, edema, etc.): No noted skin issues out side gown.  Pt needs some assistance at home.Cares for much larger spouse who is non ambulatory        Assessment/Plan     PT Assessment Patient needs continued PT services  PT Problem List Decreased activity tolerance       PT Treatment Interventions DME instruction;Therapeutic activities;Gait training;Therapeutic exercise;Stair training;Functional mobility training;Balance training;Patient/family education    PT Goals (Current goals can be found in the Care Plan section)  Acute Rehab PT Goals Patient Stated Goal: Return home with spouse and some help if she can get it. PT Goal Formulation: With patient Time For Goal Achievement: 05/27/23 Potential to Achieve Goals: Good    Frequency Min 1X/week        AM-PAC PT "6 Clicks" Mobility  Outcome Measure Help needed turning from your back to your side while in a flat bed without using bedrails?: None Help needed moving from lying on your back to sitting on the side of a flat bed without using bedrails?: None Help needed moving to and from a bed to a chair (including a wheelchair)?: None Help needed standing up from a chair using your arms (e.g., wheelchair or bedside chair)?: None Help needed to walk in hospital room?: None Help needed climbing 3-5 steps with a railing? : A Little 6 Click Score: 23    End of Session Equipment Utilized During Treatment: Gait belt Activity Tolerance: Patient tolerated treatment well Patient left: in bed;with call bell/phone within reach Nurse Communication: Mobility status PT Visit Diagnosis: Other abnormalities of gait and mobility (R26.89)    Time: 5784-6962 PT Time Calculation (min) (ACUTE ONLY): 38 min   Charges:   PT Evaluation $PT Eval Low Complexity: 1 Low PT Treatments $Therapeutic Activity: 23-37 mins PT General Charges $$ ACUTE PT VISIT: 1 Visit       Harrel Carina, DPT, CLT  Acute Rehabilitation Services Office: [phone] (Secure chat preferred)   Claudia Desanctis 05/13/2023, 10:14 AM

## 2023-05-13 NOTE — Progress Notes (Signed)
PHARMACY - ANTICOAGULATION CONSULT NOTE  Pharmacy Consult for heparin Indication: atrial fibrillation  No Known Allergies  Patient Measurements: Height: 5\' 4"  (162.6 cm) Weight: 49.3 kg (108 lb 11 oz) IBW/kg (Calculated) : 54.7 Heparin Dosing Weight: 50.7 kg   Vital Signs: Temp: 98.7 F (37.1 C) (12/29 0723) Temp Source: Oral (12/29 0723) BP: 84/50 (12/29 0833) Pulse Rate: 55 (12/29 0448)  Labs: Recent Labs    05/10/23 1701 05/11/23 1712 05/12/23 0209 05/13/23 0505 05/13/23 0506 05/13/23 0637  HGB 12.3  --   --   --   --   --   HCT 38.1  --   --   --   --   --   PLT 237  --   --   --   --   --   APTT  --   --   --  162*  --  54*  HEPARINUNFRC  --   --   --   --  >1.10* 0.77*  CREATININE  --  1.19* 1.47* 1.27*  --   --     Estimated Creatinine Clearance: 27 mL/min (A) (by C-G formula based on SCr of 1.27 mg/dL (H)).   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 On apixaban PTA for hx Afib (LD 12/28@0849 ). Changing to IV heparin for possible cardiac cath.   Hgb 12.3, plt 237  on last check. Given recent DOAC will monitor both aPTT and heparin level until correlate. Initial levels this morning drawn from same line as infusion - repeat heparin level still falsely elevated from recent DOAC (0.77), aPTT is subtherapeutic at 54, on heparin infusion at 700 units/hr. No s/sx of bleeding or infusion issues noted.   Goal of Therapy:  Heparin level 0.3-0.7 units/ml aPTT 66-102 seconds Monitor platelets by anticoagulation protocol: Yes   Plan:  Increase heparin infusion to 800 units/hr Order aPTT in 8 hr  Monitor daily aPTT/HL until correlate, CBC, and for s/sx of bleeding   Thank you for allowing pharmacy to participate in this patient's care,  Sherron Monday, PharmD, BCCCP Clinical Pharmacist  Phone: [phone] 05/13/2023 9:06 AM  Please check AMION for all [facility] Pharm[facility] phone numbers After 10:00 PM, call Main Pharmacy [phone]

## 2023-05-13 NOTE — Progress Notes (Signed)
PHARMACY - ANTICOAGULATION CONSULT NOTE  Pharmacy Consult for heparin Indication: atrial fibrillation  No Known Allergies  Patient Measurements: Height: 5\' 4"  (162.6 cm) Weight: 49.3 kg (108 lb 11 oz) IBW/kg (Calculated) : 54.7 Heparin Dosing Weight: 50.7 kg   Vital Signs: Temp: 98.7 F (37.1 C) (12/29 1635) Temp Source: Oral (12/29 1635) BP: 100/71 (12/29 1635)  Labs: Recent Labs    05/11/23 1712 05/12/23 0209 05/13/23 0505 05/13/23 0506 05/13/23 0637 05/13/23 1745  APTT  --   --  162*  --  54* 50*  HEPARINUNFRC  --   --   --  >1.10* 0.77*  --   CREATININE 1.19* 1.47* 1.27*  --   --   --     Estimated Creatinine Clearance: 27 mL/min (A) (by C-G formula based on SCr of 1.27 mg/dL (H)).   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 On apixaban PTA for hx Afib (LD 12/28@0849 ). Changing to IV heparin for possible cardiac cath.   APTT 50 is subtherapeutic on 800 units/hr.  No issues with infusion or bleeding per RN.  Goal of Therapy:  Heparin level 0.3-0.7 units/ml aPTT 66-102 seconds Monitor platelets by anticoagulation protocol: Yes   Plan:  Increase heparin infusion to 900 units/hr Monitor daily aPTT/HL until correlate, CBC, and for s/sx of bleeding   Thank you for allowing pharmacy to participate in this patient's care,  Alphia Moh, PharmD, BCPS, BCCP Clinical Pharmacist  Please check AMION for all [facility] Pharmacy phone  numbers After 10:00 PM, call Main Pharmacy [phone]

## 2023-05-13 NOTE — Progress Notes (Addendum)
Pt asked RN to remove her Unna boots because they were itchy. Ulcer 2x2 found on right pretibial leg. Pt tells me that she was scratching under her unna boot and that the ulcer was not present when they wrapped her legs. RN washed, place small xeroform and 3X3 mepilex on the ulcer.  Placed compression socks on per pt request.

## 2023-05-14 ENCOUNTER — Inpatient Hospital Stay (HOSPITAL_COMMUNITY)
Admission: AD | Disposition: A | Discharge status: Skilled Nursing Facility | Payer: Self-pay | Source: Ambulatory Visit | Attending: Cardiology

## 2023-05-14 DIAGNOSIS — I2510 Atherosclerotic heart disease of native coronary artery without angina pectoris: Secondary | ICD-10-CM | POA: Diagnosis not present

## 2023-05-14 DIAGNOSIS — I509 Heart failure, unspecified: Secondary | ICD-10-CM

## 2023-05-14 HISTORY — PX: RIGHT HEART CATH AND CORONARY ANGIOGRAPHY: CATH118264

## 2023-05-14 LAB — POCT I-STAT EG7
Acid-Base Excess: 5 mmol/L — ABNORMAL HIGH (ref 0.0–2.0)
Acid-Base Excess: 8 mmol/L — ABNORMAL HIGH (ref 0.0–2.0)
Bicarbonate: 29.6 mmol/L — ABNORMAL HIGH (ref 20.0–28.0)
Bicarbonate: 32.4 mmol/L — ABNORMAL HIGH (ref 20.0–28.0)
Calcium, Ion: 1.09 mmol/L — ABNORMAL LOW (ref 1.15–1.40)
Calcium, Ion: 1.14 mmol/L — ABNORMAL LOW (ref 1.15–1.40)
HCT: 34 % — ABNORMAL LOW (ref 36.0–46.0)
HCT: 35 % — ABNORMAL LOW (ref 36.0–46.0)
Hemoglobin: 11.6 g/dL — ABNORMAL LOW (ref 12.0–15.0)
Hemoglobin: 11.9 g/dL — ABNORMAL LOW (ref 12.0–15.0)
O2 Saturation: 63 %
O2 Saturation: 63 %
Potassium: 3.5 mmol/L (ref 3.5–5.1)
Potassium: 3.5 mmol/L (ref 3.5–5.1)
Sodium: 140 mmol/L (ref 135–145)
Sodium: 141 mmol/L (ref 135–145)
TCO2: 31 mmol/L (ref 22–32)
TCO2: 34 mmol/L — ABNORMAL HIGH (ref 22–32)
pCO2, Ven: 44.1 mm[Hg] (ref 44–60)
pCO2, Ven: 45.4 mm[Hg] (ref 44–60)
pH, Ven: 7.436 — ABNORMAL HIGH (ref 7.25–7.43)
pH, Ven: 7.462 — ABNORMAL HIGH (ref 7.25–7.43)
pO2, Ven: 31 mm[Hg] — CL (ref 32–45)
pO2, Ven: 32 mm[Hg] (ref 32–45)

## 2023-05-14 LAB — CBC
HCT: 31.7 % — ABNORMAL LOW (ref 36.0–46.0)
Hemoglobin: 10.4 g/dL — ABNORMAL LOW (ref 12.0–15.0)
MCH: 30.3 pg (ref 26.0–34.0)
MCHC: 32.8 g/dL (ref 30.0–36.0)
MCV: 92.4 fL (ref 80.0–100.0)
Platelets: 188 10*3/uL (ref 150–400)
RBC: 3.43 MIL/uL — ABNORMAL LOW (ref 3.87–5.11)
RDW: 15 % (ref 11.5–15.5)
WBC: 7 10*3/uL (ref 4.0–10.5)
nRBC: 0 % (ref 0.0–0.2)

## 2023-05-14 LAB — COOXEMETRY PANEL
Carboxyhemoglobin: 1.2 % (ref 0.5–1.5)
Methemoglobin: 0.9 % (ref 0.0–1.5)
O2 Saturation: 64.4 %
Total hemoglobin: 11 g/dL — ABNORMAL LOW (ref 12.0–16.0)

## 2023-05-14 LAB — BASIC METABOLIC PANEL
Anion gap: 9 (ref 5–15)
BUN: 25 mg/dL — ABNORMAL HIGH (ref 8–23)
CO2: 28 mmol/L (ref 22–32)
Calcium: 8 mg/dL — ABNORMAL LOW (ref 8.9–10.3)
Chloride: 103 mmol/L (ref 98–111)
Creatinine, Ser: 1.27 mg/dL — ABNORMAL HIGH (ref 0.44–1.00)
GFR, Estimated: 42 mL/min — ABNORMAL LOW (ref >60)
Glucose, Bld: 118 mg/dL — ABNORMAL HIGH (ref 70–99)
Potassium: 3.5 mmol/L (ref 3.5–5.1)
Sodium: 140 mmol/L (ref 135–145)

## 2023-05-14 LAB — APTT: aPTT: 64 s — ABNORMAL HIGH (ref 24–36)

## 2023-05-14 LAB — HEPARIN LEVEL (UNFRACTIONATED): Heparin Unfractionated: 0.46 [IU]/mL (ref 0.30–0.70)

## 2023-05-14 SURGERY — RIGHT HEART CATH AND CORONARY ANGIOGRAPHY
Anesthesia: LOCAL

## 2023-05-14 MED ORDER — HEPARIN (PORCINE) 25000 UT/250ML-% IV SOLN
900.0000 [IU]/h | INTRAVENOUS | Status: DC
  Administered 2023-05-14: 1000 [IU]/h via INTRAVENOUS
  Administered 2023-05-15: 950 [IU]/h via INTRAVENOUS
  Filled 2023-05-14 (×2): qty 250

## 2023-05-14 MED ORDER — SODIUM CHLORIDE 0.9% FLUSH
3.0000 mL | Freq: Two times a day (BID) | INTRAVENOUS | Status: DC
  Administered 2023-05-15 – 2023-05-19 (×9): 3 mL via INTRAVENOUS

## 2023-05-14 MED ORDER — HYDRALAZINE HCL 20 MG/ML IJ SOLN: 10.0000 mg | INTRAMUSCULAR | Status: AC | PRN

## 2023-05-14 MED ORDER — HEPARIN (PORCINE) IN NACL 1000-0.9 UT/500ML-% IV SOLN
INTRAVENOUS | Status: DC | PRN
  Administered 2023-05-14 (×2): 500 mL

## 2023-05-14 MED ORDER — ORAL CARE MOUTH RINSE: 15.0000 mL | OROMUCOSAL | Status: DC | PRN

## 2023-05-14 MED ORDER — ONDANSETRON HCL 4 MG/2ML IJ SOLN
INTRAMUSCULAR | Status: AC
  Filled 2023-05-14: qty 2

## 2023-05-14 MED ORDER — FUROSEMIDE 10 MG/ML IJ SOLN
60.0000 mg | Freq: Two times a day (BID) | INTRAMUSCULAR | Status: DC
Start: 2023-05-14 — End: 2023-05-15
  Administered 2023-05-14 – 2023-05-15 (×2): 60 mg via INTRAVENOUS
  Filled 2023-05-14 (×2): qty 6

## 2023-05-14 MED ORDER — HEPARIN SODIUM (PORCINE) 1000 UNIT/ML IJ SOLN
INTRAMUSCULAR | Status: AC
  Filled 2023-05-14: qty 10

## 2023-05-14 MED ORDER — SODIUM CHLORIDE 0.9 % IV SOLN: 250.0000 mL | INTRAVENOUS | Status: AC | PRN

## 2023-05-14 MED ORDER — ONDANSETRON HCL 4 MG/2ML IJ SOLN: 4.0000 mg | Freq: Four times a day (QID) | INTRAMUSCULAR | Status: DC | PRN

## 2023-05-14 MED ORDER — MIDAZOLAM HCL 2 MG/2ML IJ SOLN
INTRAMUSCULAR | Status: AC
  Filled 2023-05-14: qty 2

## 2023-05-14 MED ORDER — ONDANSETRON HCL 4 MG/2ML IJ SOLN
INTRAMUSCULAR | Status: DC | PRN
  Administered 2023-05-14: 4 mg via INTRAVENOUS

## 2023-05-14 MED ORDER — IOHEXOL 350 MG/ML SOLN
INTRAVENOUS | Status: DC | PRN
  Administered 2023-05-14: 50 mL

## 2023-05-14 MED ORDER — FENTANYL CITRATE (PF) 100 MCG/2ML IJ SOLN
INTRAMUSCULAR | Status: DC | PRN
  Administered 2023-05-14: 25 ug via INTRAVENOUS

## 2023-05-14 MED ORDER — FENTANYL CITRATE (PF) 100 MCG/2ML IJ SOLN
INTRAMUSCULAR | Status: AC
  Filled 2023-05-14: qty 2

## 2023-05-14 MED ORDER — ACETAMINOPHEN 325 MG PO TABS: 650.0000 mg | ORAL_TABLET | ORAL | Status: DC | PRN

## 2023-05-14 MED ORDER — LIDOCAINE HCL (PF) 1 % IJ SOLN
INTRAMUSCULAR | Status: DC | PRN
  Administered 2023-05-14: 10 mL
  Administered 2023-05-14: 2 mL

## 2023-05-14 MED ORDER — LIDOCAINE HCL (PF) 1 % IJ SOLN
INTRAMUSCULAR | Status: AC
  Filled 2023-05-14: qty 30

## 2023-05-14 MED ORDER — VERAPAMIL HCL 2.5 MG/ML IV SOLN
INTRAVENOUS | Status: AC
  Filled 2023-05-14: qty 2

## 2023-05-14 MED ORDER — SODIUM CHLORIDE 0.9% FLUSH: 3.0000 mL | INTRAVENOUS | Status: DC | PRN

## 2023-05-14 MED ORDER — MIDAZOLAM HCL 2 MG/2ML IJ SOLN
INTRAMUSCULAR | Status: DC | PRN
  Administered 2023-05-14: 1 mg via INTRAVENOUS

## 2023-05-14 MED ORDER — POTASSIUM CHLORIDE CRYS ER 20 MEQ PO TBCR
40.0000 meq | EXTENDED_RELEASE_TABLET | Freq: Once | ORAL | Status: AC
  Administered 2023-05-14: 40 meq via ORAL
  Filled 2023-05-14: qty 2

## 2023-05-14 MED ORDER — LABETALOL HCL 5 MG/ML IV SOLN: 10.0000 mg | INTRAVENOUS | Status: AC | PRN

## 2023-05-14 SURGICAL SUPPLY — 16 items
CATH 5FR JL3.5 JR4 ANG PIG MP (CATHETERS) IMPLANT
CATH BALLN WEDGE 5F 110CM (CATHETERS) IMPLANT
DEVICE RAD COMP TR BAND LRG (VASCULAR PRODUCTS) IMPLANT
GLIDESHEATH SLEND SS 6F .021 (SHEATH) IMPLANT
GUIDEWIRE ANGLED .035X150CM (WIRE) IMPLANT
GUIDEWIRE INQWIRE 1.5J.035X260 (WIRE) IMPLANT
INQWIRE 1.5J .035X260CM (WIRE) ×1
KIT HEART LEFT (KITS) IMPLANT
KIT MICROPUNCTURE NIT STIFF (SHEATH) IMPLANT
PACK CARDIAC CATHETERIZATION (CUSTOM PROCEDURE TRAY) ×1 IMPLANT
SHEATH GLIDE SLENDER 4/5FR (SHEATH) IMPLANT
SHEATH PINNACLE 5F 10CM (SHEATH) IMPLANT
SHEATH PINNACLE R/O II 5F 6CM (SHEATH) IMPLANT
STOPCOCK MORSE 400PSI 3WAY (MISCELLANEOUS) IMPLANT
WIRE EMERALD 3MM-J .025X260CM (WIRE) IMPLANT
WIRE HI TORQ VERSACORE-J 145CM (WIRE) IMPLANT

## 2023-05-14 NOTE — Progress Notes (Addendum)
PHARMACY - ANTICOAGULATION CONSULT NOTE  Pharmacy Consult for heparin Indication: atrial fibrillation  No Known Allergies  Patient Measurements: Height: 5\' 4"  (162.6 cm) Weight: 48.5 kg (106 lb 14.8 oz) IBW/kg (Calculated) : 54.7 Heparin Dosing Weight: 50.7 kg   Vital Signs: Temp: 98.7 F (37.1 C) (12/30 0748) Temp Source: Oral (12/30 0748) BP: 117/63 (12/30 0748)  Labs: Recent Labs    05/12/23 0209 05/13/23 0505 05/13/23 0505 05/13/23 0506 05/13/23 0637 05/13/23 1745 05/14/23 0455  HGB  --   --   --   --   --   --  10.4*  HCT  --   --   --   --   --   --  31.7*  PLT  --   --   --   --   --   --  188  APTT  --  162*   < >  --  54* 50* 64*  HEPARINUNFRC  --   --   --  >1.10* 0.77*  --  0.46  CREATININE 1.47* 1.27*  --   --   --   --  1.27*   < > = values in this interval not displayed.    Estimated Creatinine Clearance: 26.6 mL/min (A) (by C-G formula based on SCr of 1.27 mg/dL (H)).   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 On apixaban PTA for hx Afib (LD 12/28@0849 ). Changing to IV heparin for possible cardiac cath.   aPTT is subtherapeutic at 64 seconds, CBC stable. Pt s/p L/RHC to resume heparin 8h after sheath removal.  Goal of Therapy:  Heparin level 0.3-0.7 units/ml aPTT 66-102 seconds Monitor platelets by anticoagulation protocol: Yes   Plan:  Resume heparin 1000 units/h at 2000 Check aPTT and heparin level in  am  Fredonia Highland, PharmD, [place], [facility] Clinical Pharmacist [phone] Please check AMION for all [facility] Pharmacy numbers 05/14/2023

## 2023-05-14 NOTE — Progress Notes (Signed)
PT Cancellation Note  Patient Details Name: Tara Hardin MRN: [id number] DOB: 10/10/1940   Cancelled Treatment:    Reason Eval/Treat Not Completed: Patient at procedure or test/unavailable   [name] [facility] 05/14/2023, 11:00 AM Skip Mayer PT Acute Rehabilitation Services Office [phone]

## 2023-05-14 NOTE — TOC Initial Note (Signed)
Transition of Care [facility]) - Initial/Assessment Note    Patient Details  Name: Tara Hardin MRN: [id number] Date of Birth: Mar 08, 1941  [facility] CM/SW Contact:    Nicanor Bake Phone Number: [phone] 05/14/2023, 11:36 AM  Clinical Narrative:    11:13 AM- HF CSW attempted to meet with pt at bedside. Pt was not in the room at the time. CSW will follow up with pt at a more appropriate time.   TOC will continue following.                     Patient Goals and CMS Choice            Expected Discharge Plan and Services                                              Prior Living Arrangements/Services                       Activities of Daily Living   ADL Screening (condition at time of admission) Independently performs ADLs?: Yes (appropriate for developmental age) Is the patient deaf or have difficulty hearing?: No Does the patient have difficulty seeing, even when wearing glasses/contacts?: No Does the patient have difficulty concentrating, remembering, or making decisions?: No  Permission Sought/Granted                  Emotional Assessment              Admission diagnosis:  Acute on chronic combined systolic and diastolic CHF, NYHA class 3 (HCC) [I50.43] Patient Active Problem List   Diagnosis Date Noted   Acute on chronic combined systolic and diastolic CHF, NYHA class 3 (HCC) 05/11/2023   Family history of breast cancer 07/21/2021   Malignant neoplasm of upper-inner quadrant of left breast in female, estrogen receptor positive (HCC) 07/18/2021   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 [place], [place] - [address] [place] [address] Phone: [phone] Fax: [phone]     Social Drivers of Health (SDOH) Social History: SDOH Screenings   Food Insecurity: No Food Insecurity (05/11/2023)  Housing: Low Risk  (05/11/2023)  Transportation Needs: No Transportation Needs (05/11/2023)  Utilities: Not At Risk (05/11/2023)  Tobacco Use: Low Risk  (05/11/2023)   SDOH Interventions:     Readmission Risk Interventions     No data to display

## 2023-05-14 NOTE — Progress Notes (Addendum)
Patient ID: Tara Hardin, female   DOB: 1940/12/01, 82 y.o.   MRN: [id number]     Advanced Heart Failure Rounding Note  Cardiologist: Verne Carrow, MD   Subjective:   Chief Complaint: shortness of breath  Co-ox 64 on Milirnone 0.25 mcg/kg/min. Weight down 4-5lbs. sCr stable. Remains in rate controlled afib. L/RHC today.  Feeling well. Nervous about procedure. No SOB, CP, or dizziness. Swelling in legs improved. Ambulating with staff to and from bathroom.   Objective:   Weight Range: 48.5 kg Body mass index is 18.35 kg/m.   Vital Signs:   Temp:  [97.6 F (36.4 C)-98.7 F (37.1 C)] 98.7 F (37.1 C) (12/30 0320) Resp:  [18-20] 19 (12/30 0320) BP: (84-110)/(50-81) 91/69 (12/30 0320) SpO2:  [97 %-99 %] 98 % (12/30 0320) Weight:  [48.5 kg] 48.5 kg (12/30 0639) Last BM Date : 05/13/23  Weight change: Filed Weights   05/12/23 0415 05/13/23 0448 05/14/23 0639  Weight: 50.7 kg 49.3 kg 48.5 kg    Intake/Output:   Intake/Output Summary (Last 24 hours) at 05/14/2023 0714 Last data filed at 05/14/2023 6440 Gross per 24 hour  Intake 764.73 ml  Output 850 ml  Net -85.27 ml    Physical Exam    CVP 3-4 General: Frail appearing. No distress on RA HEENT: neck supple.   Cardiac: JVP not visible. Irregular rhythm. S1/S2 present. 2/6 AS RUSB. 3/6 MR at apex Resp: Lung sounds clear and equal B/L Abdomen: Soft, non-tender, non-distended. + BS. Extremities: Warm and dry. No rash, cyanosis.  No edema.  Neuro: Alert and oriented x3. Affect pleasant. Moves all extremities without difficulty. Lines/Devices:  RUE PICC  Telemetry   Afib in 80s (personally reviewed)  Labs    CBC Recent Labs    05/14/23 0455  WBC 7.0  HGB 10.4*  HCT 31.7*  MCV 92.4  PLT 188   Basic Metabolic Panel Recent Labs    34/74/25 0505 05/14/23 0455  NA 140 140  K 4.0 3.5  CL 103 103  CO2 28 28  GLUCOSE 117* 118*  BUN 28* 25*  CREATININE 1.27* 1.27*  CALCIUM 8.1* 8.0*   Liver  Function Tests No results for input(s): "AST", "ALT", "ALKPHOS", "BILITOT", "PROT", "ALBUMIN" in the last 72 hours. No results for input(s): "LIPASE", "AMYLASE" in the last 72 hours. Cardiac Enzymes No results for input(s): "CKTOTAL", "CKMB", "CKMBINDEX", "TROPONINI" in the last 72 hours.  BNP: BNP (last 3 results) Recent Labs    05/10/23 1701  BNP 4,471.5*   ProBNP (last 3 results) No results for input(s): "PROBNP" in the last 8760 hours.  D-Dimer No results for input(s): "DDIMER" in the last 72 hours. Hemoglobin A1C No results for input(s): "HGBA1C" in the last 72 hours.  Fasting Lipid Panel No results for input(s): "CHOL", "HDL", "LDLCALC", "TRIG", "CHOLHDL", "LDLDIRECT" in the last 72 hours. Thyroid Function Tests No results for input(s): "TSH", "T4TOTAL", "T3FREE", "THYROIDAB" in the last 72 hours.  Invalid input(s): "FREET3"  Other results:  Imaging   No results found.  Medications:    Scheduled Medications:  amiodarone  400 mg Oral BID   Chlorhexidine Gluconate Cloth  6 each Topical Daily   digoxin  0.0625 mg Oral Daily   letrozole  2.5 mg Oral Daily   mupirocin ointment  1 Application Nasal BID   potassium chloride  10 mEq Oral Daily   sodium chloride flush  10-40 mL Intracatheter Q12H   sodium chloride flush  3 mL Intravenous Q12H   Infusions:  sodium chloride 10 mL/hr at 05/14/23 0631   heparin 900 Units/hr (05/14/23 0631)   milrinone 0.25 mcg/kg/min (05/14/23 0631)   PRN Medications: acetaminophen, diphenhydrAMINE, sodium chloride flush, sodium chloride flush  Assessment/Plan   1. Acute systolic CHF: Echo in 12/23 with EF 50%, moderate-severe AS.  Developed atrial fibrillation in 6/24 but rate has been controlled.  I reviewed the echo done 11/24, this shows EF 20-25% with septal-lateral dyssynchrony consistent with LBBB, mild RV dysfunction, possible low flow/low gradient severe AS with mean gradient 34 mmHg and AVA 0.83 cm^2, mild-moderate mitral  regurgitation, dilated IVC.  Cause of drop in EF is uncertain.  AS does not appear critical.  She is in atrial fibrillation but rate has not been uncontrolled. She has LBBB but not markedly wide. Need to rule out CAD as cause of CMP.  She was admitted with volume overload/decompensated HF. She has diuresed well. Weight down ~4-5 lbs.  CVP 3-4.  Co-ox down to 33%, started on milrinone 0.25.  Co-ox now 64%. - Continue to hold diuretics - Continue Milrinone 0.25 mcg/kg/min. Co-ox 64. - Continue low dose digoxin 0.0625, continue as long as renal function remains stable.  - L/RHC today. Discussed risks/benefits with patient and she agrees to procedure.  - GDMT limited by soft BP and elevated creatinine. - Will ultimately need conversion to NSR.  2. Atrial fibrillation: Persistent since at least 6/24.  She has been on Eliquis but says she has missed doses occasionally.  HR 80s today.  She has a severely dilated LA, doubt she will hold NSR without anti-arrhythmic.  - Continue amiodarone 400 mg bid pre-DCCV.  - Transition to heparin gtt for now (pre-cath).   - Will need TEE-guided DCCV after cath (has missed Eliquis doses prior to admission).  3. Aortic stenosis: I reviewed echo from 11/24.  Suspect low flow/low gradient severe AS with mean gradient 34 mmHg and AVA 0.83.  Will need eventual evaluation for TAVR.   4. Mitral regurgitation: Reported as severe on 11/24 echo but looks mild-moderate. 5. PVCs: Occasionally on telemetry.  On amiodarone.  6. AKI: Creatinine 1.27. Stable.   Length of Stay: 3  [place] Lee, NP  05/14/2023, 7:14 AM  Advanced Heart Failure Team Pager [phone] (M-F; 7a - 5p)  Please contact [facility] for night-coverage after hours (5p -7a ) and weekends on [email or web address]   Patient seen with NP, agree with the above note.   Cath done today, see below: Diagnostic Dominance: Right Left Main  Vessel was injected. Vessel is normal in caliber. Vessel is angiographically normal.     Left Anterior Descending  The vessel exhibits minimal luminal irregularities.    Left Circumflex  Ost Cx to Prox Cx lesion is 30% stenosed.    Right Coronary Artery  Vessel was injected. Vessel is normal in caliber. Vessel is angiographically normal.    Intervention   No interventions have been documented.   Right Heart  Right Heart Pressures RHC Procedural Findings (on milrinone 0.25): Hemodynamics (mmHg) RA mean 14 RV 52/14 PA 50/20, mean 38 PCWP mean 28 with prominent v-waves to 40  Oxygen saturations: PA 63% AO 93%  Cardiac Output (Fick) 4.67  Cardiac Index (Fick) 3.13 PVR 2.1 WU  PAPi 2.1   She denies dyspnea.  She remains in atrial fibrillation with controlled rate.  She remains on milrinone 0.25.   General: NAD Neck: JVP 10-12 cm, no thyromegaly or thyroid nodule.  Lungs: Clear to auscultation bilaterally with normal respiratory effort. CV: Nondisplaced  PMI.  Heart irregular S1/S2, no S3/S4, 2/6 SEM RUSB.  No peripheral edema.   Abdomen: Soft, nontender, no hepatosplenomegaly, no distention.  Skin: Intact without lesions or rashes.  Neurologic: Alert and oriented x 3.  Psych: Normal affect. Extremities: No clubbing or cyanosis.  HEENT: Normal.   Nonischemic cardiomyopathy, ?due to AF vs AS vs LBBB.  I will diurese her today with Lasix 60 mg IV bid given elevated filling pressures on cath today, will continue milrinone 0.25 today (good CI on RHC) and start to cut back tomorrow.  She will continue low dose digoxin.    If she diureses well, will plan TEE-guided DCCV for tomorrow.  Discussed risks/benefits  with patient and she agrees to procedure.   Uptdated family (daughter-in-law).   Marca Ancona 05/14/2023 12:17 PM

## 2023-05-14 NOTE — Progress Notes (Signed)
Mobility Specialist Progress Note:   05/14/23 1000  Mobility  Activity Ambulated with assistance in hallway  Level of Assistance Contact guard assist, steadying assist  Assistive Device Four wheel walker  Distance Ambulated (ft) 275 ft  Activity Response Tolerated well  Mobility Referral Yes  Mobility visit 1 Mobility  Mobility Specialist Start Time (ACUTE ONLY) 0835  Mobility Specialist Stop Time (ACUTE ONLY) 0850  Mobility Specialist Time Calculation (min) (ACUTE ONLY) 15 min    Pre Mobility: 81 HR,  99/61 (73) BP Post Mobility:  82 HR  Pt received in bed, agreeable to mobility. Asymptomatic w/ no complaints throughout. Pt left in bed with call bell and all needs met.  D'Vante Earlene Plater Mobility Specialist Please contact via Special educational needs teacher or Rehab office at [phone]

## 2023-05-14 NOTE — Care Management Important Message (Signed)
Important Message  Patient Details  Name: Tara Hardin MRN: [id number] Date of Birth: 08/20/1940   Important Message Given:  Yes - Medicare IM     Dorena Bodo 05/14/2023, 3:16 PM

## 2023-05-14 NOTE — Progress Notes (Signed)
Heart Failure Navigator Progress Note  Assessed for Heart & Vascular TOC clinic readiness.  Patient does not meet criteria due to Advanced Heart Failure Team patient of Dr. Bensimhon.   Navigator will sign off at this time.   Lucilia Yanni, BSN, RN Heart Failure Nurse Navigator Secure Chat Only   

## 2023-05-14 NOTE — Plan of Care (Signed)

## 2023-05-14 NOTE — Interval H&P Note (Signed)
History and Physical Interval Note:  05/14/2023 11:05 AM  Tara Hardin  has presented today for surgery, with the diagnosis of HF.  The various methods of treatment have been discussed with the patient and family. After consideration of risks, benefits and other options for treatment, the patient has consented to  Procedure(s): RIGHT/LEFT HEART CATH AND CORONARY ANGIOGRAPHY (N/A) as a surgical intervention.  The patient's history has been reviewed, patient examined, no change in status, stable for surgery.  I have reviewed the patient's chart and labs.  Questions were answered to the patient's satisfaction.     Batool Majid [employer]

## 2023-05-14 NOTE — Progress Notes (Signed)
Physical Therapy Treatment Patient Details Name: Tara Hardin MRN: [id number] DOB: [birth year] Today's Date: 05/14/2023   History of Present Illness Pt is 82 yo presenting to [facility] for evaluation of HF. 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 PMH: a-fib, breast cancer, aortic stenosis, LBBB and systolic heart failure.    PT Comments  Pt had heart cath earlier with attempt through radial and then femoral so limited use of rt hand wrist with therapy today. This caused her to require a little more assistance today but expect she will rapidly progress. Per pt her husband is being placed in a facility which will allow her to recover.     If plan is discharge home, recommend the following: Help with stairs or ramp for entrance;Assistance with cooking/housework   Can travel by private vehicle        Equipment Recommendations  None recommended by PT (pt has rollator)    Recommendations for Other Services       Precautions / Restrictions Precautions Precautions: Other (comment) Precaution Comments: radial and femoral cath Restrictions Other Position/Activity Restrictions: limit rt wrist/hand wt bearing on day of cath     Mobility  Bed Mobility Overal bed mobility: Needs Assistance Bed Mobility: Supine to Sit     Supine to sit: Min assist     General bed mobility comments: Assist to elevate trunk into sitting without the use of her rt hand to pull/push    Transfers Overall transfer level: Needs assistance Equipment used: Rollator (4 wheels), None Transfers: Sit to/from Stand, Bed to chair/wheelchair/BSC Sit to Stand: Contact guard assist   Step pivot transfers: Min assist       General transfer comment: Verbal cues not to push up with her rt wrist/hand. Assist for safety and lines    Ambulation/Gait Ambulation/Gait assistance: Contact guard assist Gait Distance (Feet): 160 Feet Assistive device: Rollator  (4 wheels) Gait Pattern/deviations: Step-through pattern Gait velocity: decr Gait velocity interpretation: 1.31 - 2.62 ft/sec, indicative of limited community ambulator   General Gait Details: Had pt minimize any pressure on rt hand by having her place it on my hand while using her lt hand on rollator   Stairs             Wheelchair Mobility     Tilt Bed    Modified Rankin (Stroke Patients Only)       Balance Overall balance assessment: Mild deficits observed, not formally tested                                          Cognition Arousal: Alert Behavior During Therapy: WFL for tasks assessed/performed Overall Cognitive Status: Within Functional Limits for tasks assessed                                          Exercises      General Comments        Pertinent Vitals/Pain Pain Assessment Pain Assessment: Faces Faces Pain Scale: Hurts a little bit Pain Location: back Pain Descriptors / Indicators: Aching Pain Intervention(s): Limited activity within patient's tolerance, Repositioned    Home Living  Prior Function            PT Goals (current goals can now be found in the care plan section) Acute Rehab PT Goals Patient Stated Goal: return home Progress towards PT goals: Not progressing toward goals - comment (had cardiac cath and limited use of RUE s/p cath)    Frequency    Min 1X/week      PT Plan      Co-evaluation              AM-PAC PT "6 Clicks" Mobility   Outcome Measure  Help needed turning from your back to your side while in a flat bed without using bedrails?: A Little Help needed moving from lying on your back to sitting on the side of a flat bed without using bedrails?: A Little Help needed moving to and from a bed to a chair (including a wheelchair)?: A Little Help needed standing up from a chair using your arms (e.g., wheelchair or bedside chair)?: A  Little Help needed to walk in hospital room?: A Little Help needed climbing 3-5 steps with a railing? : A Little 6 Click Score: 18    End of Session   Activity Tolerance: Patient tolerated treatment well Patient left: in chair;with call bell/phone within reach Nurse Communication: Mobility status PT Visit Diagnosis: Other abnormalities of gait and mobility (R26.89)     Time: 1710-1738 PT Time Calculation (min) (ACUTE ONLY): 28 min  Charges:    $Gait Training: 23-37 mins PT General Charges $$ ACUTE PT VISIT: 1 Visit                     [facility] PT Acute Rehabilitation Services Office [phone]    [name] [facility] 05/14/2023, 6:51 PM

## 2023-05-15 ENCOUNTER — Inpatient Hospital Stay (HOSPITAL_COMMUNITY): Payer: Medicare Other

## 2023-05-15 ENCOUNTER — Inpatient Hospital Stay (HOSPITAL_COMMUNITY): Payer: Medicare Other | Admitting: Anesthesiology

## 2023-05-15 ENCOUNTER — Encounter (HOSPITAL_COMMUNITY): Payer: Self-pay | Admitting: Cardiology

## 2023-05-15 ENCOUNTER — Encounter (HOSPITAL_COMMUNITY)
Admission: AD | Disposition: A | Discharge status: Skilled Nursing Facility | Payer: Self-pay | Source: Ambulatory Visit | Attending: Cardiology

## 2023-05-15 DIAGNOSIS — I340 Nonrheumatic mitral (valve) insufficiency: Secondary | ICD-10-CM

## 2023-05-15 DIAGNOSIS — I4891 Unspecified atrial fibrillation: Secondary | ICD-10-CM

## 2023-05-15 DIAGNOSIS — I509 Heart failure, unspecified: Secondary | ICD-10-CM

## 2023-05-15 DIAGNOSIS — I5043 Acute on chronic combined systolic (congestive) and diastolic (congestive) heart failure: Secondary | ICD-10-CM | POA: Diagnosis not present

## 2023-05-15 HISTORY — PX: TRANSESOPHAGEAL ECHOCARDIOGRAM (CATH LAB): EP1270

## 2023-05-15 HISTORY — PX: CARDIOVERSION: EP1203

## 2023-05-15 LAB — ECHO TEE
AR max vel: 0.85 cm2
AV Area VTI: 0.8 cm2
AV Area mean vel: 0.85 cm2
AV Mean grad: 29 mm[Hg]
AV Peak grad: 47.9 mm[Hg]
Ao pk vel: 3.46 m/s
Est EF: <20
MV M vel: 5.05 m/s
MV Peak grad: 102 mm[Hg]
Radius: 0.6 cm

## 2023-05-15 LAB — BASIC METABOLIC PANEL
Anion gap: 8 (ref 5–15)
BUN: 20 mg/dL (ref 8–23)
CO2: 29 mmol/L (ref 22–32)
Calcium: 7.9 mg/dL — ABNORMAL LOW (ref 8.9–10.3)
Chloride: 100 mmol/L (ref 98–111)
Creatinine, Ser: 1.21 mg/dL — ABNORMAL HIGH (ref 0.44–1.00)
GFR, Estimated: 45 mL/min — ABNORMAL LOW (ref >60)
Glucose, Bld: 216 mg/dL — ABNORMAL HIGH (ref 70–99)
Potassium: 3.8 mmol/L (ref 3.5–5.1)
Sodium: 137 mmol/L (ref 135–145)

## 2023-05-15 LAB — HEPARIN LEVEL (UNFRACTIONATED): Heparin Unfractionated: >1.1 [IU]/mL — ABNORMAL HIGH (ref 0.30–0.70)

## 2023-05-15 LAB — COOXEMETRY PANEL
Carboxyhemoglobin: 1.9 % — ABNORMAL HIGH (ref 0.5–1.5)
Carboxyhemoglobin: 1.7 % — ABNORMAL HIGH (ref 0.5–1.5)
Methemoglobin: <0.7 % (ref 0.0–1.5)
Methemoglobin: <0.7 % (ref 0.0–1.5)
O2 Saturation: 74.5 %
O2 Saturation: 59.7 %
Total hemoglobin: 10.2 g/dL — ABNORMAL LOW (ref 12.0–16.0)
Total hemoglobin: 11.2 g/dL — ABNORMAL LOW (ref 12.0–16.0)

## 2023-05-15 LAB — CBC
HCT: 30.5 % — ABNORMAL LOW (ref 36.0–46.0)
Hemoglobin: 9.9 g/dL — ABNORMAL LOW (ref 12.0–15.0)
MCH: 30.5 pg (ref 26.0–34.0)
MCHC: 32.5 g/dL (ref 30.0–36.0)
MCV: 93.8 fL (ref 80.0–100.0)
Platelets: 182 10*3/uL (ref 150–400)
RBC: 3.25 MIL/uL — ABNORMAL LOW (ref 3.87–5.11)
RDW: 15 % (ref 11.5–15.5)
WBC: 6.8 10*3/uL (ref 4.0–10.5)
nRBC: 0 % (ref 0.0–0.2)

## 2023-05-15 LAB — APTT: aPTT: 108 s — ABNORMAL HIGH (ref 24–36)

## 2023-05-15 SURGERY — TRANSESOPHAGEAL ECHOCARDIOGRAM (TEE) (CATHLAB)
Anesthesia: Monitor Anesthesia Care

## 2023-05-15 MED ORDER — ETOMIDATE 2 MG/ML IV SOLN
INTRAVENOUS | Status: DC | PRN
  Administered 2023-05-15: 2 mg via INTRAVENOUS
  Administered 2023-05-15: 6 mg via INTRAVENOUS
  Administered 2023-05-15 (×2): 2 mg via INTRAVENOUS

## 2023-05-15 MED ORDER — PROPOFOL 10 MG/ML IV BOLUS
INTRAVENOUS | Status: DC | PRN
  Administered 2023-05-15 (×2): 10 mg via INTRAVENOUS
  Administered 2023-05-15: 20 mg via INTRAVENOUS
  Administered 2023-05-15 (×2): 10 mg via INTRAVENOUS

## 2023-05-15 MED ORDER — FUROSEMIDE 10 MG/ML IJ SOLN
60.0000 mg | Freq: Two times a day (BID) | INTRAMUSCULAR | Status: DC
  Filled 2023-05-15: qty 6

## 2023-05-15 MED ORDER — BUTAMBEN-TETRACAINE-BENZOCAINE 2-2-14 % EX AERO
INHALATION_SPRAY | CUTANEOUS | Status: DC | PRN
  Administered 2023-05-15: 1 via TOPICAL

## 2023-05-15 MED ORDER — BUTAMBEN-TETRACAINE-BENZOCAINE 2-2-14 % EX AERO
INHALATION_SPRAY | CUTANEOUS | Status: AC
  Filled 2023-05-15: qty 20

## 2023-05-15 MED ORDER — HYDROCORTISONE 1 % EX CREA
1.0000 | TOPICAL_CREAM | Freq: Three times a day (TID) | CUTANEOUS | Status: DC | PRN
  Administered 2023-05-15 – 2023-05-16 (×3): 1 via TOPICAL
  Filled 2023-05-15: qty 28

## 2023-05-15 MED ORDER — POTASSIUM CHLORIDE CRYS ER 20 MEQ PO TBCR: 20.0000 meq | EXTENDED_RELEASE_TABLET | Freq: Once | ORAL | Status: DC

## 2023-05-15 MED ORDER — SODIUM CHLORIDE 0.9 % IV SOLN: INTRAVENOUS | Status: DC | PRN

## 2023-05-15 MED ORDER — POTASSIUM CHLORIDE CRYS ER 20 MEQ PO TBCR
20.0000 meq | EXTENDED_RELEASE_TABLET | Freq: Every day | ORAL | Status: DC
  Administered 2023-05-15 – 2023-05-16 (×2): 20 meq via ORAL
  Filled 2023-05-15 (×2): qty 1

## 2023-05-15 MED ORDER — LIDOCAINE 2% (20 MG/ML) 5 ML SYRINGE
INTRAMUSCULAR | Status: DC | PRN
  Administered 2023-05-15: 40 mg via INTRAVENOUS

## 2023-05-15 MED FILL — Verapamil HCl IV Soln 2.5 MG/ML: INTRAVENOUS | Qty: 2 | Status: AC

## 2023-05-15 MED FILL — Heparin Sodium (Porcine) Inj 1000 Unit/ML: INTRAMUSCULAR | Qty: 10 | Status: AC

## 2023-05-15 SURGICAL SUPPLY — 1 items: PAD DEFIB RADIO PHYSIO CONN (PAD) ×1 IMPLANT

## 2023-05-15 NOTE — TOC Progression Note (Signed)
 Transition of Care [facility]) - Progression Note    Patient Details  Name: Tara Hardin MRN: [id number] Date of Birth: 27-Feb-1941  [facility] CM/SW Contact  [name] Phone Number: [phone] 05/15/2023, 4:18 PM  Clinical Narrative:  HF CSW spoke with HF CM who stated that the family was agreeable to SNF. Preferred SNFs are [facility] and [facility]. CSW completed the pts FL2 and faxed out to SNFs. SNF offers pending.   TOC will continue following.     Expected Discharge Plan: Assisted Living Barriers to Discharge: Continued Medical Work up  Expected Discharge Plan and Services   Discharge Planning Services: CM Consult   Living arrangements for the past 2 months: Single Family Home                                       Social Determinants of Health (SDOH) Interventions SDOH Screenings   Food Insecurity: No Food Insecurity (05/11/2023)  Housing: Low Risk  (05/11/2023)  Transportation Needs: No Transportation Needs (05/11/2023)  Utilities: Not At Risk (05/11/2023)  Social Connections: Moderately Isolated (05/14/2023)  Tobacco Use: Low Risk  (05/15/2023)    Readmission Risk Interventions     No data to display

## 2023-05-15 NOTE — TOC Initial Note (Addendum)
 Transition of Care [facility]) - Initial/Assessment Note    Patient Details  Name: Tara Hardin MRN: [id number] Date of Birth: [birth year]  Transition of Care [facility]) CM/SW Contact:    Justina Delcia Czar, RN Phone Number: [phone] 05/15/2023, 8:56 AM  Clinical Narrative:                  HF TOC CM spoke to pt's daughter in law and son, they are planning ALF for their pt's husband waiting in ED. DIL states they would like for both pt and father to be together at ALF. [facility] plans to visit patient's husband for an assessment and will discuss having ALF assess patient for admission. Pt has financial means for caregiver in the home but DIL states she has fired all of them in the past. They have financial means for pt and husband to be placed in ALF.   HF TOC CM spoke to pt and she is in agreement for [facility] ALF # [phone]. CM contacted [facility] Admission [facility] #[phone] to see if they can complete her assessment today also. Updated DIL that pt has agreed to ALF with husband. Spoke to [place] ALF Admission Coordinator, states to give her a call closer to dc so they can do her assessment. She is not sure pt's husband will be a appropriate for ALF.   Received update from DIL and pt was declined for ALF, ED CSW will pursue SNF. DIL is requesting [facility] rehab or [facility] and same for pt. Updated HF CSW, DIL gave permission to create FL2 and fax to facilities. Will need updated PT evaluation.   [facility] and [facility] has ALF/SNF rehab. [facility] is first choice.     Expected Discharge Plan: Assisted Living Barriers to Discharge: Continued Medical Work up   Patient Goals and CMS Choice Patient states their goals for this hospitalization and ongoing recovery are:: wants her to get better and remain independent CMS Medicare.gov Compare Post Acute Care list provided to:: Patient Represenative (must comment) Choice offered to / list presented to :  Adult Children      Expected Discharge Plan and Services   Discharge Planning Services: CM Consult   Living arrangements for the past 2 months: Single Family Home                                      Prior Living Arrangements/Services Living arrangements for the past 2 months: Single Family Home Lives with:: Spouse Patient language and need for interpreter reviewed:: Yes Do you feel safe going back to the place where you live?: Yes      Need for Family Participation in Patient Care: Yes (Comment) Care giver support system in place?: Yes (comment)   Criminal Activity/Legal Involvement Pertinent to Current Situation/Hospitalization: No - Comment as needed  Activities of Daily Living   ADL Screening (condition at time of admission) Independently performs ADLs?: Yes (appropriate for developmental age) Is the patient deaf or have difficulty hearing?: No Does the patient have difficulty seeing, even when wearing glasses/contacts?: No Does the patient have difficulty concentrating, remembering, or making decisions?: No  Permission Sought/Granted Permission sought to share information with : Case Manager, Family Supports, PCP Permission granted to share information with : Yes, Verbal Permission Granted  Share Information with NAME: Gerren Kemple  Permission granted to share info w AGENCY: Assisted Living Facility  Permission  granted to share info w Relationship: daughter in law, son  Permission granted to share info w Contact Information: [phone]  Emotional Assessment           Psych Involvement: No (comment)  Admission diagnosis:  Acute on chronic combined systolic and diastolic CHF, NYHA class 3 (HCC) [I50.43] Patient Active Problem List   Diagnosis Date Noted   Acute on chronic combined systolic and diastolic CHF, NYHA class 3 (HCC) 05/11/2023   Family history of breast cancer 07/21/2021   Malignant neoplasm of upper-inner quadrant of left breast in female,  estrogen receptor positive (HCC) 07/18/2021   Aortic valve sclerosis 08/30/2020   Cardiac murmur, unspecified 08/30/2020   Chill 08/30/2020   Prediabetes 08/30/2020   Hematochezia 08/30/2020   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 [place], [place] - [address] [place] [address] Phone: [phone] Fax: [phone]     Social Drivers of Health (SDOH) Social History: SDOH Screenings   Food Insecurity: No Food Insecurity (05/11/2023)  Housing: Low Risk  (05/11/2023)  Transportation Needs: No Transportation Needs (05/11/2023)  Utilities: Not At Risk (05/11/2023)  Social Connections: Moderately Isolated (05/14/2023)  Tobacco Use: Low Risk  (05/11/2023)   SDOH Interventions:     Readmission Risk Interventions     No data to display

## 2023-05-15 NOTE — Anesthesia Postprocedure Evaluation (Signed)
 Anesthesia Post Note  Patient: Tara Hardin  Procedure(s) Performed: TRANSESOPHAGEAL ECHOCARDIOGRAM CARDIOVERSION     Patient location during evaluation: PACU Anesthesia Type: MAC Level of consciousness: awake and alert Pain management: pain level controlled Vital Signs Assessment: post-procedure vital signs reviewed and stable Respiratory status: spontaneous breathing, nonlabored ventilation and respiratory function stable Cardiovascular status: blood pressure returned to baseline Postop Assessment: no apparent nausea or vomiting Anesthetic complications: no   No notable events documented.  Last Vitals:  Vitals:   05/15/23 1123 05/15/23 1143  BP: 101/84 108/60  Pulse: 60 (!) 58  Resp: 11 15  Temp:  36.6 C  SpO2: 96% 92%    Last Pain:  Vitals:   05/15/23 1143  TempSrc: Oral  PainSc: 0-No pain                 Vertell Row

## 2023-05-15 NOTE — Anesthesia Preprocedure Evaluation (Addendum)
 Anesthesia Evaluation  Patient identified by MRN, date of birth, ID band Patient awake    Reviewed: Allergy & Precautions, NPO status , Patient's Chart, lab work & pertinent test results  History of Anesthesia Complications Negative for: history of anesthetic complications  Airway Mallampati: II  TM Distance: >3 FB Neck ROM: Full    Dental no notable dental hx.    Pulmonary neg pulmonary ROS   Pulmonary exam normal        Cardiovascular +CHF  Normal cardiovascular exam  TTE 03/2023: EF <20%, severe LAE/RAE, severe MR, mild AR, moderate AS    Neuro/Psych negative neurological ROS     GI/Hepatic negative GI ROS, Neg liver ROS,,,  Endo/Other  negative endocrine ROS    Renal/GU negative Renal ROS     Musculoskeletal negative musculoskeletal ROS (+)    Abdominal   Peds  Hematology  (+) Blood dyscrasia (Hgb 9.9), anemia   Anesthesia Other Findings Day of surgery medications reviewed with patient.  Reproductive/Obstetrics                              Anesthesia Physical Anesthesia Plan  ASA: 4  Anesthesia Plan: MAC   Post-op Pain Management: Minimal or no pain anticipated   Induction:   PONV Risk Score and Plan: 2 and Treatment may vary due to age or medical condition and Propofol  infusion  Airway Management Planned: Natural Airway and Nasal Cannula  Additional Equipment: None  Intra-op Plan:   Post-operative Plan:   Informed Consent: I have reviewed the patients History and Physical, chart, labs and discussed the procedure including the risks, benefits and alternatives for the proposed anesthesia with the patient or authorized representative who has indicated his/her understanding and acceptance.       Plan Discussed with: CRNA  Anesthesia Plan Comments:          Anesthesia Quick Evaluation

## 2023-05-15 NOTE — Interval H&P Note (Signed)
 History and Physical Interval Note:  05/15/2023 10:32 AM  Rock Dec  has presented today for surgery, with the diagnosis of Heart failure.  The various methods of treatment have been discussed with the patient and family. After consideration of risks, benefits and other options for treatment, the patient has consented to  Procedure(s): TRANSESOPHAGEAL ECHOCARDIOGRAM (N/A) CARDIOVERSION (N/A) as a surgical intervention.  The patient's history has been reviewed, patient examined, no change in status, stable for surgery.  I have reviewed the patient's chart and labs.  Questions were answered to the patient's satisfaction.     Klea Nall [employer]

## 2023-05-15 NOTE — NC FL2 (Signed)
 [place]  MEDICAID FL2 LEVEL OF CARE FORM     IDENTIFICATION  Patient Name: Tara Hardin Birthdate: 06-Aug-1940 Sex: female Admission Date (Current Location): 05/11/2023  [facility] and [place] Number:  Producer, Television/film/video and Address:  [facility] [place], [address], [place], [place] [address]      Provider Number: [id number]  Attending Physician Name and Address:  Zenaida Morene PARAS, MD  Relative Name and Phone Number:  Loise, Esguerra   [phone]    Current Level of Care: Hospital Recommended Level of Care: Skilled Nursing Facility Prior Approval Number:    Date Approved/Denied:   PASRR Number: [id number]  Discharge Plan: SNF    Current Diagnoses: Patient Active Problem List   Diagnosis Date Noted   Acute on chronic combined systolic and diastolic CHF, NYHA class 3 (HCC) 05/11/2023   Family history of breast cancer 07/21/2021   Malignant neoplasm of upper-inner quadrant of left breast in female, estrogen receptor positive (HCC) 07/18/2021   Aortic valve sclerosis 08/30/2020   Cardiac murmur, unspecified 08/30/2020   Chill 08/30/2020   Prediabetes 08/30/2020   Hematochezia 08/30/2020   Mammary duct ectasia of left breast 08/30/2020   Hyperlipidemia    Nonrheumatic tricuspid (valve) insufficiency 07/21/2020   Dyslipidemia 07/21/2020   Dyspnea on exertion 07/21/2020    Orientation RESPIRATION BLADDER Height & Weight     Self, Situation, Time, Place  Normal Continent Weight: 105 lb (47.6 kg) (Scale A) Height:  5' 4 (162.6 cm)  BEHAVIORAL SYMPTOMS/MOOD NEUROLOGICAL BOWEL NUTRITION STATUS      Continent Diet (see dc summary)  AMBULATORY STATUS COMMUNICATION OF NEEDS Skin   Limited Assist Verbally PU Stage and Appropriate Care (Wound / Incision12/29/24 Pretibial Proximal;Right ulcer from pt ithing under her unna boot wrap    Continue    Assessment)                       Personal Care Assistance Level of Assistance  Bathing,  Feeding, Dressing, Total care Bathing Assistance: Limited assistance Feeding assistance: Limited assistance Dressing Assistance: Limited assistance Total Care Assistance: Limited assistance   Functional Limitations Info  Sight, Hearing, Speech Sight Info: Impaired Hearing Info: Impaired Speech Info: Adequate    SPECIAL CARE FACTORS FREQUENCY  PT (By licensed PT), OT (By licensed OT)     PT Frequency: 5x weekly OT Frequency: 5x weekly            Contractures      Additional Factors Info  Code Status, Allergies Code Status Info: FULL Allergies Info: No known allergies           Current Medications (05/15/2023):  This is the current hospital active medication list Current Facility-Administered Medications  Medication Dose Route Frequency Provider Last Rate Last Admin   acetaminophen  (TYLENOL ) tablet 650 mg  650 mg Oral Q4H PRN Marcine Catalan M, PA-C       acetaminophen  (TYLENOL ) tablet 650 mg  650 mg Oral Q4H PRN McLean, Dalton S, MD       amiodarone  (PACERONE ) tablet 400 mg  400 mg Oral BID McLean, Dalton S, MD   400 mg at 05/15/23 1159   Chlorhexidine  Gluconate Cloth 2 % PADS 6 each  6 each Topical Daily Stoner, Benjamin J, MD   6 each at 05/15/23 0900   digoxin  (LANOXIN ) tablet 0.0625 mg  0.0625 mg Oral Daily McLean, Dalton S, MD   0.0625 mg at 05/15/23 1159   diphenhydrAMINE  (BENADRYL ) 12.5 MG/5ML elixir  12.5 mg  12.5 mg Oral QHS PRN Stoner, Benjamin J, MD   12.5 mg at 05/14/23 2333   heparin  ADULT infusion 100 units/mL (25000 units/250mL)  950 Units/hr Intravenous Continuous Cyndy Ozell DASEN, RPH 9.5 mL/hr at 05/15/23 1028 950 Units/hr at 05/15/23 1028   letrozole  (FEMARA ) tablet 2.5 mg  2.5 mg Oral Daily Simmons, Brittainy M, PA-C   2.5 mg at 05/15/23 1158   milrinone  (PRIMACOR ) 20 MG/100 ML (0.2 mg/mL) infusion  0.125 mcg/kg/min Intravenous Continuous Lee, Jordan, NP   Stopped at 05/15/23 1027   mupirocin  ointment (BACTROBAN ) 2 % 1 Application  1 Application  Nasal BID Zenaida Morene PARAS, MD   1 Application at 05/15/23 1511   ondansetron  (ZOFRAN ) injection 4 mg  4 mg Intravenous Q6H PRN McLean, Dalton S, MD       Oral care mouth rinse  15 mL Mouth Rinse PRN Zenaida Morene PARAS, MD       potassium chloride  SA (KLOR-CON  M) CR tablet 20 mEq  20 mEq Oral Daily Lee, Jordan, NP   20 mEq at 05/15/23 1159   sodium chloride  flush (NS) 0.9 % injection 10-40 mL  10-40 mL Intracatheter Q12H Zenaida Morene PARAS, MD   10 mL at 05/14/23 2346   sodium chloride  flush (NS) 0.9 % injection 10-40 mL  10-40 mL Intracatheter PRN Zenaida Morene PARAS, MD       sodium chloride  flush (NS) 0.9 % injection 3 mL  3 mL Intravenous Q12H Marcine Catalan M, PA-C   3 mL at 05/13/23 2110   sodium chloride  flush (NS) 0.9 % injection 3 mL  3 mL Intravenous PRN Marcine Catalan M, PA-C       sodium chloride  flush (NS) 0.9 % injection 3 mL  3 mL Intravenous Q12H Rolan Ezra RAMAN, MD       sodium chloride  flush (NS) 0.9 % injection 3 mL  3 mL Intravenous PRN McLean, Dalton S, MD         Discharge Medications: Please see discharge summary for a list of discharge medications.  Relevant Imaging Results:  Relevant Lab Results:   Additional Information SS: [id number]  Arlana PARAS Moats, LCSWA

## 2023-05-15 NOTE — CV Procedure (Signed)
 Procedure: TEE  Sedation: Per anesthesiology  Findings: Please see echo section for full report.  There was no LA appendage thrombus.  EF in 20-25% range, RV moderately dysfunctional, suspect low flow/low gradient severe aortic stenosis, moderate mitral regurgitation.   Proceed with DCCV.   Ezra Shuck 05/15/2023 11:03 AM ,

## 2023-05-15 NOTE — Progress Notes (Signed)
 Echocardiogram Echocardiogram Transesophageal has been performed.  Lucendia Herrlich 05/15/2023, 11:01 AM

## 2023-05-15 NOTE — Progress Notes (Signed)
 PHARMACY - ANTICOAGULATION CONSULT NOTE  Pharmacy Consult for heparin  Indication: atrial fibrillation  No Known Allergies  Patient Measurements: Height: 5' 4 (162.6 cm) Weight: 47.6 kg (105 lb) (Scale A) IBW/kg (Calculated) : 54.7 Heparin  Dosing Weight: 50.7 kg   Vital Signs: Temp: 98.1 F (36.7 C) (12/31 0800) Temp Source: Oral (12/31 0800) BP: 90/54 (12/31 0800) Pulse Rate: 65 (12/31 0800)  Labs: Recent Labs    05/13/23 0505 05/13/23 0506 05/13/23 9362 05/13/23 1745 05/14/23 0455 05/14/23 0455 05/14/23 1122 05/15/23 0623 05/15/23 0625 05/15/23 0746  HGB  --   --   --   --  10.4*   < > 11.9*  11.6* 9.9*  --   --   HCT  --   --   --   --  31.7*  --  35.0*  34.0* 30.5*  --   --   PLT  --   --   --   --  188  --   --  182  --   --   APTT 162*  --  54* 50* 64*  --   --   --   --  108*  HEPARINUNFRC  --    < > 0.77*  --  0.46  --   --   --  >1.10*  --   CREATININE 1.27*  --   --   --  1.27*  --   --  1.21*  --   --    < > = values in this interval not displayed.    Estimated Creatinine Clearance: 27.4 mL/min (A) (by C-G formula based on SCr of 1.21 mg/dL (H)).   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 On apixaban  PTA for hx Afib (LD 12/28@0849 ). Changing to IV heparin  for possible cardiac cath.   aPTT is slightly above goal at 108 seconds, heparin  level >1.1 due to DOAC influence.  Goal of Therapy:  Heparin  level 0.3-0.7  units/ml aPTT 66-102 seconds Monitor platelets by anticoagulation protocol: Yes   Plan:  Reduce heparin  to 950 units/h Check aPTT and heparin  level in am  Ozell Jamaica, PharmD, [place], [facility] Clinical Pharmacist [phone] Please check AMION for all [facility] Pharmacy numbers 05/15/2023

## 2023-05-15 NOTE — Plan of Care (Signed)
  Problem: Clinical Measurements: Goal: Diagnostic test results will improve Outcome: Progressing Goal: Respiratory complications will improve Outcome: Progressing Goal: Cardiovascular complication will be avoided Outcome: Progressing   Problem: Activity: Goal: Risk for activity intolerance will decrease Outcome: Progressing   Problem: Coping: Goal: Level of anxiety will decrease Outcome: Progressing   Problem: Pain Management: Goal: General experience of comfort will improve Outcome: Progressing   Problem: Safety: Goal: Ability to remain free from injury will improve Outcome: Progressing

## 2023-05-15 NOTE — Transfer of Care (Signed)
 Immediate Anesthesia Transfer of Care Note  Patient: Tara Hardin  Procedure(s) Performed: TRANSESOPHAGEAL ECHOCARDIOGRAM CARDIOVERSION  Patient Location: PACU and Cath Lab  Anesthesia Type:MAC  Level of Consciousness: drowsy, patient cooperative, and responds to stimulation  Airway & Oxygen Therapy: Patient Spontanous Breathing and Patient connected to nasal cannula oxygen  Post-op Assessment: Report given to RN and Post -op Vital signs reviewed and stable  Post vital signs: Reviewed and stable  Last Vitals:  Vitals Value Taken Time  BP 95/50 05/15/23 1103  Temp 36.6 C 05/15/23 1103  Pulse 55 05/15/23 1104  Resp 21 05/15/23 1104  SpO2 91 % 05/15/23 1104  Vitals shown include unfiled device data.  Last Pain:  Vitals:   05/15/23 1103  TempSrc: Tympanic  PainSc: Asleep         Complications: No notable events documented.

## 2023-05-15 NOTE — Evaluation (Signed)
 Occupational Therapy Evaluation Patient Details Name: Tara Hardin MRN: [id number] DOB: 02/13/1941 Today's Date: 05/15/2023   History of Present Illness Pt is 82 yo presenting to [facility] for evaluation of HF. 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 PMH: a-fib, breast cancer, aortic stenosis, LBBB and systolic heart failure.   Clinical Impression   Pt feeling okay, minimal pain with movement at chest/back, feels fatigued. Pt is caregiver for husband, husband is at bed level. Pt PLOF independent, driving. Pt currently close to or at baseline, limited due to decreased activity tolerance. Pt able to complete ADLs with supervision, no AD for mobility, able to gather/transport items as needed at supervision level. Pt would benefit from Rollator for return home to maximize safety with ambulation and transporting items, and energy conservation. Pt would benefit from continued skilled OT to maximize activity tolerance and return to PLOF. No OT follow up needed.      If plan is discharge home, recommend the following: A little help with walking and/or transfers;Assist for transportation    Functional Status Assessment  Patient has had a recent decline in their functional status and demonstrates the ability to make significant improvements in function in a reasonable and predictable amount of time.  Equipment Recommendations  Other (comment) (rollator)    Recommendations for Other Services       Precautions / Restrictions Precautions Precautions: Other (comment) Precaution Comments: radial and femoral cath Restrictions Weight Bearing Restrictions Per Provider Order: No      Mobility Bed Mobility Overal bed mobility: Modified Independent             General bed mobility comments: able to get in/out of bed as needed, increased time for line management    Transfers Overall transfer level: Needs assistance Equipment used:  None Transfers: Sit to/from Stand, Bed to chair/wheelchair/BSC Sit to Stand: Supervision     Step pivot transfers: Supervision     General transfer comment: supervision, good overall balance, no LOB, no AD      Balance Overall balance assessment: Mild deficits observed, not formally tested                                         ADL either performed or assessed with clinical judgement   ADL Overall ADL's : Needs assistance/impaired                                       General ADL Comments: close to baseline of independent. supervision for safety and line management for ambulation, otherwise doing well.     Vision Baseline Vision/History: 1 Wears glasses;4 Cataracts Ability to See in Adequate Light: 0 Adequate Patient Visual Report: No change from baseline       Perception         Praxis         Pertinent Vitals/Pain Pain Assessment Pain Assessment: 0-10 Pain Score: 6  Pain Location: chest/back with activities Pain Descriptors / Indicators: Aching, Discomfort Pain Intervention(s): Monitored during session     Extremity/Trunk Assessment Upper Extremity Assessment Upper Extremity Assessment: Overall WFL for tasks assessed   Lower Extremity Assessment Lower Extremity Assessment: Defer to PT evaluation       Communication Communication Communication: No apparent difficulties   Cognition Arousal: Alert  Behavior During Therapy: WFL for tasks assessed/performed Overall Cognitive Status: Within Functional Limits for tasks assessed                                       General Comments       Exercises     Shoulder Instructions      Home Living Family/patient expects to be discharged to:: Private residence Living Arrangements: Spouse/significant other Available Help at Discharge: Family Type of Home: House Home Access: Stairs to enter [employer] of Steps: ramp Entrance Stairs-Rails:  None Home Layout: One level     Bathroom Shower/Tub: Walk-in shower;Tub/shower unit   [employer]: Standard     Home Equipment: It sales professional;Wheelchair - manual;BSC/3in1;Standard Environmental Consultant;Shower seat - built in;Grab bars - tub/shower;Hand held shower head   Additional Comments: cares for husband      Prior Functioning/Environment Prior Level of Function : Independent/Modified Independent;Driving             Mobility Comments: Pt ambulates independently, uses rollator sometimes ADLs Comments: Pt church helps with meals 2 wednesdays a month, states laundry and cleaning is backed up.        OT Problem List: Decreased strength;Decreased activity tolerance;Pain      OT Treatment/Interventions:      OT Goals(Current goals can be found in the care plan section) Acute Rehab OT Goals Patient Stated Goal: to return home OT Goal Formulation: With patient Time For Goal Achievement: 05/29/23 Potential to Achieve Goals: Good  OT Frequency:      Co-evaluation              AM-PAC OT 6 Clicks Daily Activity     Outcome Measure Help from another person eating meals?: None Help from another person taking care of personal grooming?: None Help from another person toileting, which includes using toliet, bedpan, or urinal?: None Help from another person bathing (including washing, rinsing, drying)?: None Help from another person to put on and taking off regular upper body clothing?: None Help from another person to put on and taking off regular lower body clothing?: None 6 Click Score: 24   End of Session Equipment Utilized During Treatment: Gait belt Nurse Communication: Mobility status  Activity Tolerance: Patient tolerated treatment well Patient left: in bed;with call bell/phone within reach  OT Visit Diagnosis: Other abnormalities of gait and mobility (R26.89);Muscle weakness (generalized) (M62.81);Pain Pain - part of body:  (chest/back)                Time:  8454-8390 OT Time Calculation (min): 24 min Charges:  OT General Charges $OT Visit: 1 Visit OT Evaluation $OT Eval Low Complexity: 1 Low OT Treatments $Self Care/Home Management : 8-22 mins  Miller, OTR/L   Tara Hardin 05/15/2023, 4:15 PM

## 2023-05-15 NOTE — Procedures (Signed)
 Electrical Cardioversion Procedure Note Tara Hardin [id number] 1940/07/12  Procedure: Electrical Cardioversion Indications:  Atrial Fibrillation  Procedure Details Consent: Risks of procedure as well as the alternatives and risks of each were explained to the (patient/caregiver).  Consent for procedure obtained. Time Out: Verified patient identification, verified procedure, site/side was marked, verified correct patient position, special equipment/implants available, medications/allergies/relevent history reviewed, required imaging and test results available.  Performed  Patient placed on cardiac monitor, pulse oximetry, supplemental oxygen as necessary.  Sedation given:  Per anesthesiology Pacer pads placed anterior and posterior chest.  Cardioverted 3 time(s).  Cardioverted at 200, 360, 360J.  Evaluation Findings: Post procedure EKG shows: NSR Complications: None Patient did tolerate procedure well.   Ezra Shuck 05/15/2023, 11:04 AM

## 2023-05-15 NOTE — Progress Notes (Addendum)
 Patient ID: Tara Hardin, female   DOB: 01-14-1941, 82 y.o.   MRN: [id number]     Advanced Heart Failure Rounding Note  Cardiologist: Lonni Cash, MD   Subjective:   Chief Complaint: shortness of breath  Co-ox 75 on Milirnone 0.25 mcg/kg/min. Net -1L with one dose Lasix  60mg  IV. Softer BP overnight 80/50s. Evening Lasix  held.   L/RHC yesterday: mild cor disease (30% Cx stenosis), RA 14, PA 80/20 (38) PCWP 28, v-waves to 40, CO/CI (fick) 4.67/3.13, PAPi 2.1 TEE/DC-CV today  Lying in bed. No SOB, CP, dizziness.   Objective:   Weight Range: 47.6 kg Body mass index is 18.02 kg/m.   Vital Signs:   Temp:  [97.4 F (36.3 C)-98.7 F (37.1 C)] 98.2 F (36.8 C) (12/31 0436) Pulse Rate:  [0-93] 65 (12/31 0436) Resp:  [17-33] 19 (12/31 0436) BP: (93-143)/(53-122) 96/63 (12/31 0436) SpO2:  [87 %-98 %] 94 % (12/31 0436) Weight:  [47.6 kg] 47.6 kg (12/31 0436) Last BM Date : 05/15/23  Weight change: Filed Weights   05/13/23 0448 05/14/23 0639 05/15/23 0436  Weight: 49.3 kg 48.5 kg 47.6 kg   Intake/Output:   Intake/Output Summary (Last 24 hours) at 05/15/2023 0647 Last data filed at 05/15/2023 0400 Gross per 24 hour  Intake 598.48 ml  Output 1650 ml  Net -1051.52 ml    Physical Exam    CVP 5-6 General: Frail appearing. No distress on RA HEENT: neck supple.   Cardiac: JVP ~8 cm. Irregular rhythm. S1 and S2 present. 2/6 AS RUSB. 3/6 MR at apex. Resp: Fine crackles in bases and middle lobe Abdomen: Soft, non-tender, non-distended. + BS. Extremities: Warm and dry. No rash, cyanosis.  No peripheral edema.  Neuro: Alert and oriented x3. Affect pleasant. Moves all extremities without difficulty. Lines/Devices:  RUE PICC  Telemetry   Afib in 60s with LBBB (personally reviewed)  Labs    CBC Recent Labs    05/14/23 0455 05/14/23 1122 05/15/23 0623  WBC 7.0  --  6.8  HGB 10.4* 11.9*  11.6* 9.9*  HCT 31.7* 35.0*  34.0* 30.5*  MCV 92.4  --  93.8  PLT 188  --   182   Basic Metabolic Panel Recent Labs    87/70/75 0505 05/14/23 0455 05/14/23 1122  NA 140 140 140  141  K 4.0 3.5 3.5  3.5  CL 103 103  --   CO2 28 28  --   GLUCOSE 117* 118*  --   BUN 28* 25*  --   CREATININE 1.27* 1.27*  --   CALCIUM 8.1* 8.0*  --    Liver Function Tests No results for input(s): AST, ALT, ALKPHOS, BILITOT, PROT, ALBUMIN in the last 72 hours. No results for input(s): LIPASE, AMYLASE in the last 72 hours. Cardiac Enzymes No results for input(s): CKTOTAL, CKMB, CKMBINDEX, TROPONINI in the last 72 hours.  BNP: BNP (last 3 results) Recent Labs    05/10/23 1701  BNP 4,471.5*   ProBNP (last 3 results) No results for input(s): PROBNP in the last 8760 hours.  D-Dimer No results for input(s): DDIMER in the last 72 hours. Hemoglobin A1C No results for input(s): HGBA1C in the last 72 hours.  Fasting Lipid Panel No results for input(s): CHOL, HDL, LDLCALC, TRIG, CHOLHDL, LDLDIRECT in the last 72 hours. Thyroid Function Tests No results for input(s): TSH, T4TOTAL, T3FREE, THYROIDAB in the last 72 hours.  Invalid input(s): FREET3  Other results:  Imaging   CARDIAC CATHETERIZATION Result Date: 05/14/2023 1. Nonobstructive  mild coronary disease. 2. LV and RV filling pressures remain elevated. 3. Pulmonary venous hypertension. 4. Good cardiac output on milrinone  0.25.    Medications:    Scheduled Medications:  amiodarone   400 mg Oral BID   Chlorhexidine  Gluconate Cloth  6 each Topical Daily   digoxin   0.0625 mg Oral Daily   furosemide   60 mg Intravenous BID   letrozole   2.5 mg Oral Daily   mupirocin  ointment  1 Application Nasal BID   potassium chloride   10 mEq Oral Daily   sodium chloride  flush  10-40 mL Intracatheter Q12H   sodium chloride  flush  3 mL Intravenous Q12H   sodium chloride  flush  3 mL Intravenous Q12H   Infusions:  sodium chloride      heparin  1,000 Units/hr (05/14/23 2340)    milrinone  0.25 mcg/kg/min (05/14/23 2339)   PRN Medications: sodium chloride , acetaminophen , acetaminophen , diphenhydrAMINE , ondansetron  (ZOFRAN ) IV, mouth rinse, sodium chloride  flush, sodium chloride  flush, sodium chloride  flush  Assessment/Plan   1. Acute systolic CHF: Echo in 12/23 with EF 50%, moderate-severe AS.  Developed atrial fibrillation in 6/24 but rate has been controlled.  I reviewed the echo done 11/24, this shows EF 20-25% with septal-lateral dyssynchrony consistent with LBBB, mild RV dysfunction, possible low flow/low gradient severe AS with mean gradient 34 mmHg and AVA 0.83 cm^2, mild-moderate mitral regurgitation, dilated IVC.  Cause of drop in EF is uncertain.  AS does not appear critical.  She is in atrial fibrillation but rate has not been uncontrolled. She has LBBB but not markedly wide. No significant CAD on cath, nonischemic cardiomyopathy.  She was admitted with volume overload/decompensated HF. CVP 5-6.  Co-ox down to 33%, started on milrinone  0.25.  Co-ox now 75%. - L/RHC yesterday: mild cor disease (30% Cx stenosis), RA 14, PA 80/20 (38) PCWP 28, v-waves to 40, CO/CI (fick) 4.67/3.13, PAPi 2.1 - Lasix  60 mg IV BID. Missed dose last night d/t hypotension - Decrease Milrinone  0.125 mcg/kg/min. Repeat Co-ox this afternoon.  - Continue low dose digoxin  0.0625, continue as long as renal function remains stable.  - GDMT limited by soft BP  - TEE/DC-CV today. Discussed risks/benefits with patient and she agrees to procedure.  2. Atrial fibrillation: Persistent since at least 6/24.  She has been on Eliquis  but says she has missed doses occasionally.  HR 80s today.  She has a severely dilated LA, doubt she will hold NSR without anti-arrhythmic.  - Continue amiodarone  400 mg bid  - On heparin  gtt   - TEE-guided DCCV today (has missed Eliquis  doses prior to admission).  3. Aortic stenosis: I reviewed echo from 11/24.  Suspect low flow/low gradient severe AS with mean gradient  34 mmHg and AVA 0.83.  Will need eventual evaluation for TAVR.   4. Mitral regurgitation: Reported as severe on 11/24 echo but looks mild-moderate. 5. PVCs: Occasionally on telemetry.  On amiodarone .  6. AKI: Creatinine 1.27. Stable.   Length of Stay: 4  Jordan Lee, NP  05/15/2023, 6:47 AM  Advanced Heart Failure Team Pager [phone] (M-F; 7a - 5p)  Please contact [facility] for night-coverage after hours (5p -7a ) and weekends on [email or web address]   Patient seen with NP, agree with the above note.   I/Os net negative 1051 yesterday, CVP 5-6 this morning. Creatinine stable 1.27.   Milrinone  stopped today and she underwent TEE-guided DCCV to NSR.  She required 3 shocks.  TEE showed EF 20-25% range, moderate RV dysfunction, low flow/low gradient severe AS, moderate MR.  IVC was not dilated.   General: NAD Neck: No JVD, no thyromegaly or thyroid nodule.  Lungs: Clear to auscultation bilaterally with normal respiratory effort. CV: Nondisplaced PMI.  Heart irregular S1/S2, no S3/S4, 3/6 SEM RUSB.  No peripheral edema.   Abdomen: Soft, nontender, no hepatosplenomegaly, no distention.  Skin: Intact without lesions or rashes.  Neurologic: Alert and oriented x 3.  Psych: Normal affect. Extremities: No clubbing or cyanosis.  HEENT: Normal.   She is now back in NSR, off milrinone .  I think we can hold IV Lasix  at this point and start her tomorrow on po diuretic, would use torsemide . She does not have much BP room for GDMT at this point, continue digoxin .    Continue po amiodarone , continue heparin  gtt today and transition back to Eliquis  tomorrow.    She will need evaluation for TAVR.   Ezra Shuck 05/15/2023 11:10 AM

## 2023-05-15 NOTE — Plan of Care (Signed)

## 2023-05-15 NOTE — Progress Notes (Signed)
 Mobility Specialist Progress Note:   05/15/23 1000  Mobility  Activity Ambulated with assistance in hallway  Level of Assistance Standby assist, set-up cues, supervision of patient - no hands on  Assistive Device Four wheel walker  Distance Ambulated (ft) 275 ft  Activity Response Tolerated well  Mobility Referral Yes  Mobility visit 1 Mobility  Mobility Specialist Start Time (ACUTE ONLY) 0902  Mobility Specialist Stop Time (ACUTE ONLY) [id number]  Mobility Specialist Time Calculation (min) (ACUTE ONLY) 26 min    Received pt on EOB having no complaints and agreeable to mobility. Pt was asymptomatic throughout ambulation and returned to room w/o fault. Left in bed w/ call bell in reach and all needs met.  D'Vante Nicholaus Mobility Specialist Please contact via [employer] or Rehab office at [phone]

## 2023-05-16 DIAGNOSIS — I5043 Acute on chronic combined systolic (congestive) and diastolic (congestive) heart failure: Secondary | ICD-10-CM | POA: Diagnosis not present

## 2023-05-16 LAB — BASIC METABOLIC PANEL
Anion gap: 6 (ref 5–15)
BUN: 22 mg/dL (ref 8–23)
CO2: 30 mmol/L (ref 22–32)
Calcium: 8.4 mg/dL — ABNORMAL LOW (ref 8.9–10.3)
Chloride: 103 mmol/L (ref 98–111)
Creatinine, Ser: 1.15 mg/dL — ABNORMAL HIGH (ref 0.44–1.00)
GFR, Estimated: 48 mL/min — ABNORMAL LOW (ref >60)
Glucose, Bld: 123 mg/dL — ABNORMAL HIGH (ref 70–99)
Potassium: 4.3 mmol/L (ref 3.5–5.1)
Sodium: 139 mmol/L (ref 135–145)

## 2023-05-16 LAB — CBC
HCT: 33.1 % — ABNORMAL LOW (ref 36.0–46.0)
Hemoglobin: 10.9 g/dL — ABNORMAL LOW (ref 12.0–15.0)
MCH: 30.9 pg (ref 26.0–34.0)
MCHC: 32.9 g/dL (ref 30.0–36.0)
MCV: 93.8 fL (ref 80.0–100.0)
Platelets: 170 10*3/uL (ref 150–400)
RBC: 3.53 MIL/uL — ABNORMAL LOW (ref 3.87–5.11)
RDW: 15.2 % (ref 11.5–15.5)
WBC: 7.1 10*3/uL (ref 4.0–10.5)
nRBC: 0 % (ref 0.0–0.2)

## 2023-05-16 LAB — COOXEMETRY PANEL
Carboxyhemoglobin: 1.2 % (ref 0.5–1.5)
Methemoglobin: <0.7 % (ref 0.0–1.5)
O2 Saturation: 59.9 %
Total hemoglobin: 11.1 g/dL — ABNORMAL LOW (ref 12.0–16.0)

## 2023-05-16 LAB — APTT: aPTT: 101 s — ABNORMAL HIGH (ref 24–36)

## 2023-05-16 LAB — HEPARIN LEVEL (UNFRACTIONATED): Heparin Unfractionated: 0.73 [IU]/mL — ABNORMAL HIGH (ref 0.30–0.70)

## 2023-05-16 MED ORDER — APIXABAN 2.5 MG PO TABS
2.5000 mg | ORAL_TABLET | Freq: Two times a day (BID) | ORAL | Status: DC
  Administered 2023-05-16 – 2023-05-21 (×10): 2.5 mg via ORAL
  Filled 2023-05-16 (×10): qty 1

## 2023-05-16 MED ORDER — APIXABAN 5 MG PO TABS
5.0000 mg | ORAL_TABLET | Freq: Two times a day (BID) | ORAL | Status: DC
  Administered 2023-05-16: 5 mg via ORAL
  Filled 2023-05-16: qty 1

## 2023-05-16 MED ORDER — TORSEMIDE 20 MG PO TABS: 20.0000 mg | ORAL_TABLET | Freq: Every day | ORAL | Status: DC

## 2023-05-16 NOTE — Plan of Care (Signed)
  Problem: Education: Goal: Knowledge of General Education information will improve Description: Including pain rating scale, medication(s)/side effects and non-pharmacologic comfort measures Outcome: Progressing   Problem: Clinical Measurements: Goal: Diagnostic test results will improve Outcome: Progressing Goal: Cardiovascular complication will be avoided Outcome: Progressing   Problem: Activity: Goal: Risk for activity intolerance will decrease Outcome: Progressing   

## 2023-05-16 NOTE — Progress Notes (Signed)
 Patient calls nurse into room for itching. She is itching in an area on her upper chest, midline. This area is pink and in the outline of a chest pad.   Patient had a cardioversion today, and it appears the shape of pad used for this.   Hydrocortisone  cream ordered and applied.

## 2023-05-16 NOTE — Progress Notes (Addendum)
 PHARMACY - ANTICOAGULATION CONSULT NOTE  Pharmacy Consult for heparin  > Eliquis  PTA Indication: atrial fibrillation  No Known Allergies  Patient Measurements: Height: 5' 4 (162.6 cm) Weight: 46.9 kg (103 lb 8 oz) (Scale A) IBW/kg (Calculated) : 54.7 Heparin  Dosing Weight: 50.7 kg   Vital Signs: Temp: 98.7 F (37.1 C) (01/01 0422) Temp Source: Oral (01/01 0422) BP: 96/59 (01/01 0422) Pulse Rate: 61 (01/01 0422)  Labs: Recent Labs    05/14/23 0455 05/14/23 1122 05/15/23 0623 05/15/23 0625 05/15/23 0746 05/16/23 0447  HGB 10.4* 11.9*  11.6* 9.9*  --   --  10.9*  HCT 31.7* 35.0*  34.0* 30.5*  --   --  33.1*  PLT 188  --  182  --   --  170  APTT 64*  --   --   --  108* 101*  HEPARINUNFRC 0.46  --   --  >1.10*  --  0.73*  CREATININE 1.27*  --  1.21*  --   --  1.15*    Estimated Creatinine Clearance: 28.4 mL/min (A) (by C-G formula based on SCr of 1.15 mg/dL (H)).   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 On apixaban  PTA for hx Afib (LD 12/28@0849 ). Changing to IV heparin  for possible cardiac cath.   aPTT is on upper end of range at 101 sec, heparin  level 0.73 almost correlating.  No issues overnight per RN.  CBC ok - Hgb 10.9, pltc 170.  Goal of Therapy:  Heparin  level 0.3-0.7 units/ml aPTT 66-102 seconds Monitor platelets by anticoagulation protocol: Yes   Plan:  Reduce heparin  slightly to 900 units/h Daily CBC, aPTT and heparin   level   ADDENDUM: Transitioning to PTA Eliquis  - stop heparin , start Eliquis  2.5 mg BID.  Maurilio Fila, PharmD Clinical Pharmacist 05/16/2023  7:29 AM

## 2023-05-16 NOTE — Progress Notes (Signed)
 Patient with 2/10 subtle discomfort in chest and back. States it is from procedure yesterday which was a cardioversion.   Patient asks for Tylenol but only wants 325 of the 650 mg. Administered 325 Tylenol.

## 2023-05-16 NOTE — Plan of Care (Signed)

## 2023-05-16 NOTE — Progress Notes (Signed)
 OVERNIGHT CROSS COVER NOTE  Received page from [place], [place] regarding CVP of 11 and crackles on exam. CVP on note from today <5 mmHg. I/O total today -180 ml, compared to -1L yesterday. Day team plans to begin torsemide  in the AM. We will obtain CXR in the AM; if imaging and exam correlate with increased volume, we will diurese further.   Plan discussed with RN  Merlene Blood, MD MS

## 2023-05-16 NOTE — Progress Notes (Signed)
 Patient ID: Tara Hardin, female   DOB: 12/30/1940, 83 y.o.   MRN: [id number]     Advanced Heart Failure Rounding Note  Cardiologist: Lonni Cash, MD   Subjective:   Chief Complaint: shortness of breath  Off milrinone , co-ox 60%.  CVP < 5, had IV Lasix  yesterday.  Creatinine 1.15 today.   She remains in NSR on heparin  gtt + po amiodarone .   L/RHC 12/30: mild cor disease (30% Cx stenosis), RA 14, PA 80/20 (38) PCWP 28, v-waves to 40, CO/CI (fick) 4.67/3.13, PAPi 2.1 TEE-DCCV on 12/31: TEE showed EF 20-25% range, moderate RV dysfunction, low flow/low gradient severe AS, moderate MR.  IVC was not dilated.  She converted to NSR with difficulty (3 shocks).   Itching at DCCV pad sites.  Breathing is better.   Objective:   Weight Range: 46.9 kg Body mass index is 17.77 kg/m.   Vital Signs:   Temp:  [97.8 F (36.6 C)-98.7 F (37.1 C)] 98.6 F (37 C) (01/01 0803) Pulse Rate:  [53-75] 53 (01/01 0803) Resp:  [12-18] 14 (01/01 0422) BP: (96-115)/(59-73) 96/59 (01/01 0422) SpO2:  [92 %-95 %] 92 % (01/01 0803) Weight:  [46.9 kg] 46.9 kg (01/01 0422) Last BM Date : 05/15/23  Weight change: Filed Weights   05/14/23 0639 05/15/23 0436 05/16/23 0422  Weight: 48.5 kg 47.6 kg 46.9 kg   Intake/Output:   Intake/Output Summary (Last 24 hours) at 05/16/2023 1133 Last data filed at 05/15/2023 2044 Gross per 24 hour  Intake 480 ml  Output --  Net 480 ml    Physical Exam    CVP <5 General: Thin, NAD Neck: No JVD, no thyromegaly or thyroid nodule.  Lungs: Clear to auscultation bilaterally with normal respiratory effort. CV: Nondisplaced PMI.  Heart regular S1/S2, no S3/S4, 3/6 SEM RSUB.  No peripheral edema.   Abdomen: Soft, nontender, no hepatosplenomegaly, no distention.  Skin: Intact without lesions or rashes.  Neurologic: Alert and oriented x 3.  Psych: Normal affect. Extremities: No clubbing or cyanosis.  HEENT: Normal.   Telemetry   NSR in 60s, LBBB (personally  reviewed)  Labs    CBC Recent Labs    05/15/23 0623 05/16/23 0447  WBC 6.8 7.1  HGB 9.9* 10.9*  HCT 30.5* 33.1*  MCV 93.8 93.8  PLT 182 170   Basic Metabolic Panel Recent Labs    87/68/75 0623 05/16/23 0447  NA 137 139  K 3.8 4.3  CL 100 103  CO2 29 30  GLUCOSE 216* 123*  BUN 20 22  CREATININE 1.21* 1.15*  CALCIUM 7.9* 8.4*   Liver Function Tests No results for input(s): AST, ALT, ALKPHOS, BILITOT, PROT, ALBUMIN in the last 72 hours. No results for input(s): LIPASE, AMYLASE in the last 72 hours. Cardiac Enzymes No results for input(s): CKTOTAL, CKMB, CKMBINDEX, TROPONINI in the last 72 hours.  BNP: BNP (last 3 results) Recent Labs    05/10/23 1701  BNP 4,471.5*   ProBNP (last 3 results) No results for input(s): PROBNP in the last 8760 hours.  D-Dimer No results for input(s): DDIMER in the last 72 hours. Hemoglobin A1C No results for input(s): HGBA1C in the last 72 hours.  Fasting Lipid Panel No results for input(s): CHOL, HDL, LDLCALC, TRIG, CHOLHDL, LDLDIRECT in the last 72 hours. Thyroid Function Tests No results for input(s): TSH, T4TOTAL, T3FREE, THYROIDAB in the last 72 hours.  Invalid input(s): FREET3  Other results:  Imaging   No results found.   Medications:    Scheduled  Medications:  amiodarone   400 mg Oral BID   apixaban   5 mg Oral BID   Chlorhexidine  Gluconate Cloth  6 each Topical Daily   digoxin   0.0625 mg Oral Daily   letrozole   2.5 mg Oral Daily   mupirocin  ointment  1 Application Nasal BID   potassium chloride   20 mEq Oral Daily   sodium chloride  flush  10-40 mL Intracatheter Q12H   sodium chloride  flush  3 mL Intravenous Q12H   sodium chloride  flush  3 mL Intravenous Q12H   Infusions:  milrinone  Stopped (05/15/23 1027)   PRN Medications: acetaminophen , acetaminophen , diphenhydrAMINE , hydrocortisone  cream, ondansetron  (ZOFRAN ) IV, mouth rinse, sodium chloride  flush,  sodium chloride  flush, sodium chloride  flush  Assessment/Plan   1. Acute systolic CHF: Echo in 12/23 with EF 50%, moderate-severe AS.  Developed atrial fibrillation in 6/24 but rate has been controlled.  I reviewed the echo done 11/24, this shows EF 20-25% with septal-lateral dyssynchrony consistent with LBBB, mild RV dysfunction, possible low flow/low gradient severe AS with mean gradient 34 mmHg and AVA 0.83 cm^2, mild-moderate mitral regurgitation, dilated IVC.  Cause of drop in EF is uncertain.  AS does not appear critical but think severe.  She is in atrial fibrillation but rate has not been uncontrolled. She has LBBB. No significant CAD on cath, nonischemic cardiomyopathy.  She was admitted with volume overload/decompensated HF. She is now s/p DCCV and in NSR.  CVP < 5, co-ox 60%.  She was initially on milrinone , now titrated off.  No BP room for additional meds.  - Start torsemide  20 mg daily tomorrow.  - Continue low dose digoxin  0.0625, continue as long as renal function remains stable. Check level in am.  - Next step will be TAVR evaluation as outpatient.  2. Atrial fibrillation: Persistent since at least 6/24.  She has been on Eliquis  but says she has missed doses occasionally.  HR 80s today.  She has a severely dilated LA, doubt she will hold NSR without anti-arrhythmic. TEE-guided DCCV on 12/31, she remains in NSR today.  - Continue amiodarone  400 mg bid  - Transition to Eliquis .  3. Aortic stenosis: I reviewed echo from 11/24.  Suspect low flow/low gradient severe AS with mean gradient 34 mmHg and AVA 0.83.  TEE confirmed low flow/low gradient severe AS.  - Workup for TAVR as outpatient.  4. Mitral regurgitation: Moderate on TEE this admission. 5. PVCs: Occasionally on telemetry.  On amiodarone .  6. AKI: Creatinine 1.15. Stable.   Mobilize, possible discharge tomorrow.   Length of Stay: 5  Ezra Shuck, MD  05/16/2023, 11:33 AM  Advanced Heart Failure Team Pager [phone] (M-F;  7a - 5p)  Please contact [facility] for night-coverage after hours (5p -7a ) and weekends on [email or web address]

## 2023-05-16 NOTE — Progress Notes (Signed)
 Patient has fine crackles throughout the lower left lung field, more apparently than last evening when I also had the patient. CVP with good wave form and patient at 30 degrees, is 11. This a.m. with good waveform I got 6. Dr. Duffy, cardiology, paged regarding above.  Patient also has a rash/red area where patches were applied for cardioversion, both in the front of the chest and on the upper back. Redness in shape of pads. Hydrocortisone  cream was ordered last evening. Patient thinks the itching she has been having is getting a little better.

## 2023-05-16 NOTE — Progress Notes (Signed)
 Mobility Specialist Progress Note:   05/16/23 1150  Mobility  Activity Ambulated with assistance in hallway  Level of Assistance Standby assist, set-up cues, supervision of patient - no hands on  Assistive Device Four wheel walker  Distance Ambulated (ft) 315 ft  Activity Response Tolerated well  Mobility Referral Yes  Mobility visit 1 Mobility  Mobility Specialist Start Time (ACUTE ONLY) 0930  Mobility Specialist Stop Time (ACUTE ONLY) 0945  Mobility Specialist Time Calculation (min) (ACUTE ONLY) 15 min   Pt received in bed, agreeable to mobility. C/o slight fatigue during ambulation, otherwise asx throughout. VSS. Pt returned to bed with call bell in reach and all needs met.   Brown Husband  Mobility Specialist Please contact via [employer] office at [phone]

## 2023-05-17 ENCOUNTER — Encounter (HOSPITAL_COMMUNITY): Payer: Self-pay | Admitting: Cardiology

## 2023-05-17 ENCOUNTER — Inpatient Hospital Stay (HOSPITAL_COMMUNITY): Payer: Medicare Other

## 2023-05-17 DIAGNOSIS — I4819 Other persistent atrial fibrillation: Secondary | ICD-10-CM | POA: Diagnosis not present

## 2023-05-17 DIAGNOSIS — I5043 Acute on chronic combined systolic (congestive) and diastolic (congestive) heart failure: Secondary | ICD-10-CM | POA: Diagnosis not present

## 2023-05-17 LAB — CBC
HCT: 32.2 % — ABNORMAL LOW (ref 36.0–46.0)
Hemoglobin: 10.5 g/dL — ABNORMAL LOW (ref 12.0–15.0)
MCH: 30.7 pg (ref 26.0–34.0)
MCHC: 32.6 g/dL (ref 30.0–36.0)
MCV: 94.2 fL (ref 80.0–100.0)
Platelets: 155 10*3/uL (ref 150–400)
RBC: 3.42 MIL/uL — ABNORMAL LOW (ref 3.87–5.11)
RDW: 14.9 % (ref 11.5–15.5)
WBC: 5.4 10*3/uL (ref 4.0–10.5)
nRBC: 0 % (ref 0.0–0.2)

## 2023-05-17 LAB — BASIC METABOLIC PANEL
Anion gap: 9 (ref 5–15)
BUN: 26 mg/dL — ABNORMAL HIGH (ref 8–23)
CO2: 28 mmol/L (ref 22–32)
Calcium: 8.4 mg/dL — ABNORMAL LOW (ref 8.9–10.3)
Chloride: 100 mmol/L (ref 98–111)
Creatinine, Ser: 1.36 mg/dL — ABNORMAL HIGH (ref 0.44–1.00)
GFR, Estimated: 39 mL/min — ABNORMAL LOW (ref >60)
Glucose, Bld: 112 mg/dL — ABNORMAL HIGH (ref 70–99)
Potassium: 4.5 mmol/L (ref 3.5–5.1)
Sodium: 137 mmol/L (ref 135–145)

## 2023-05-17 LAB — COOXEMETRY PANEL
Carboxyhemoglobin: 0.9 % (ref 0.5–1.5)
Methemoglobin: 0.8 % (ref 0.0–1.5)
O2 Saturation: 53.9 %
Total hemoglobin: 10.9 g/dL — ABNORMAL LOW (ref 12.0–16.0)

## 2023-05-17 LAB — DIGOXIN LEVEL: Digoxin Level: 0.6 ng/mL — ABNORMAL LOW (ref 0.8–2.0)

## 2023-05-17 LAB — LIPOPROTEIN A (LPA): Lipoprotein (a): 25.7 nmol/L (ref <75.0)

## 2023-05-17 MED ORDER — BISMUTH SUBSALICYLATE 262 MG PO CHEW
524.0000 mg | CHEWABLE_TABLET | ORAL | Status: DC | PRN
  Administered 2023-05-17: 524 mg via ORAL
  Filled 2023-05-17 (×3): qty 2

## 2023-05-17 MED ORDER — AMIODARONE HCL 200 MG PO TABS: 200.0000 mg | ORAL_TABLET | Freq: Two times a day (BID) | ORAL | Status: DC

## 2023-05-17 MED ORDER — CHLORHEXIDINE GLUCONATE CLOTH 2 % EX PADS
6.0000 | MEDICATED_PAD | Freq: Every day | CUTANEOUS | Status: DC
  Administered 2023-05-17 – 2023-05-21 (×5): 6 via TOPICAL

## 2023-05-17 MED ORDER — AMIODARONE HCL 200 MG PO TABS
200.0000 mg | ORAL_TABLET | Freq: Every day | ORAL | Status: DC
  Administered 2023-05-19 – 2023-05-21 (×3): 200 mg via ORAL
  Filled 2023-05-17 (×4): qty 1

## 2023-05-17 NOTE — NC FL2 (Signed)
 [place]  MEDICAID FL2 LEVEL OF CARE FORM     IDENTIFICATION  Patient Name: Tara Hardin Birthdate: [birth year] Sex: female Admission Date (Current Location): 05/11/2023  [facility] and [place] Number:  Producer, Television/film/video and Address:  The . [facility], [address] [place], [place] [address]      Provider Number: [id number]  Attending Physician Name and Address:  Zenaida Morene PARAS, MD  Relative Name and Phone Number:  Hortense, daughter in law [phone]    Current Level of Care: Hospital Recommended Level of Care: Assisted Living Facility Prior Approval Number:    Date Approved/Denied:   PASRR Number: [id number]  Discharge Plan: Other (Comment) (ALF)    Current Diagnoses: Patient Active Problem List   Diagnosis Date Noted   Acute on chronic combined systolic and diastolic CHF, NYHA class 3 (HCC) 05/11/2023   Family history of breast cancer 07/21/2021   Malignant neoplasm of upper-inner quadrant of left breast in female, estrogen receptor positive (HCC) 07/18/2021   Aortic valve sclerosis 08/30/2020   Cardiac murmur, unspecified 08/30/2020   Chill 08/30/2020   Prediabetes 08/30/2020   Hematochezia 08/30/2020   Mammary duct ectasia of left breast 08/30/2020   Hyperlipidemia    Nonrheumatic tricuspid (valve) insufficiency 07/21/2020   Dyslipidemia 07/21/2020   Dyspnea on exertion 07/21/2020    Orientation RESPIRATION BLADDER Height & Weight     Self, Time, Situation, Place  Normal Continent Weight: 105 lb 2.6 oz (47.7 kg) Height:  5' 4 (162.6 cm)  BEHAVIORAL SYMPTOMS/MOOD NEUROLOGICAL BOWEL NUTRITION STATUS      Continent Diet (see dc summary)  AMBULATORY STATUS COMMUNICATION OF NEEDS Skin   Supervision Verbally PU Stage and Appropriate Care (Wound / Incision (Open or Dehisced) 05/13/23 Pretibial Proximal;Right ulcer from pt ithing under her unna boot wrap)                       Personal Care Assistance Level of Assistance   Bathing, Feeding, Dressing, Total care Bathing Assistance: Limited assistance Feeding assistance: Limited assistance Dressing Assistance: Limited assistance Total Care Assistance: Limited assistance   Functional Limitations Info  Sight, Hearing, Speech Sight Info: Impaired Hearing Info: Impaired Speech Info: Adequate    SPECIAL CARE FACTORS FREQUENCY  PT (By licensed PT), OT (By licensed OT) (HH PT/OT)     PT Frequency: 5x weekly OT Frequency: 5x weekly            Contractures      Additional Factors Info  Code Status, Allergies Code Status Info: Full Allergies Info: No known allergies           Current Medications (05/17/2023):  This is the current hospital active medication list Current Facility-Administered Medications  Medication Dose Route Frequency Provider Last Rate Last Admin   acetaminophen  (TYLENOL ) tablet 650 mg  650 mg Oral Q4H PRN McLean, Dalton S, MD       amiodarone  (PACERONE ) tablet 200 mg  200 mg Oral BID Riddle, Suzann, NP       apixaban  (ELIQUIS ) tablet 2.5 mg  2.5 mg Oral BID Stoner, Benjamin J, MD   2.5 mg at 05/17/23 0825   bismuth  subsalicylate (PEPTO BISMOL) chewable tablet 524 mg  524 mg Oral PRN Osude, Nkiru C, MD   524 mg at 05/17/23 0825   Chlorhexidine  Gluconate Cloth 2 % PADS 6 each  6 each Topical Daily Zenaida Morene PARAS, MD   6 each at 05/17/23 1020   diphenhydrAMINE  (BENADRYL ) 12.5 MG/5ML elixir  12.5 mg  12.5 mg Oral QHS PRN Stoner, Benjamin J, MD   12.5 mg at 05/14/23 2333   hydrocortisone  cream 1 % 1 Application  1 Application Topical TID PRN Stoner, Benjamin J, MD   1 Application at 05/16/23 2144   letrozole  (FEMARA ) tablet 2.5 mg  2.5 mg Oral Daily Marcine Catalan M, PA-C   2.5 mg at 05/17/23 0825   ondansetron  (ZOFRAN ) injection 4 mg  4 mg Intravenous Q6H PRN McLean, Dalton S, MD       Oral care mouth rinse  15 mL Mouth Rinse PRN Zenaida Morene PARAS, MD       sodium chloride  flush (NS) 0.9 % injection 10-40 mL  10-40 mL  Intracatheter Q12H Zenaida Morene PARAS, MD   10 mL at 05/17/23 0826   sodium chloride  flush (NS) 0.9 % injection 10-40 mL  10-40 mL Intracatheter PRN Zenaida Morene PARAS, MD       sodium chloride  flush (NS) 0.9 % injection 3 mL  3 mL Intravenous Q12H Marcine Catalan M, PA-C   3 mL at 05/17/23 0827   sodium chloride  flush (NS) 0.9 % injection 3 mL  3 mL Intravenous PRN Marcine Catalan M, PA-C       sodium chloride  flush (NS) 0.9 % injection 3 mL  3 mL Intravenous Q12H Rolan Ezra RAMAN, MD   3 mL at 05/17/23 0827   sodium chloride  flush (NS) 0.9 % injection 3 mL  3 mL Intravenous PRN McLean, Dalton S, MD         Discharge Medications: Please see discharge summary for a list of discharge medications.  Relevant Imaging Results:  Relevant Lab Results:   Additional Information SS: [id number]  Arlana PARAS [place], LCSWA

## 2023-05-17 NOTE — TOC Progression Note (Signed)
 Transition of Care [facility]) - Progression Note    Patient Details  Name: Tara Hardin MRN: [id number] Date of Birth: [birth year]  [facility]) CM/SW Contact  Justina Delcia Czar, RN Phone Number: [phone] 05/17/2023, 12:15 PM  Clinical Narrative:    HF TOC CM received call from [facility] and coordinator, Sabrina Parkin will come to do an assessment. They will need FL2 and TB gold series. Son and DIL live out of states and pt husband is going to SNF rehab. HF TOC CSW notified.    Expected Discharge Plan: Assisted Living Barriers to Discharge: Continued Medical Work up  Expected Discharge Plan and Services   Discharge Planning Services: CM Consult   Living arrangements for the past 2 months: Single Family Home                                       Social Determinants of Health (SDOH) Interventions SDOH Screenings   Food Insecurity: No Food Insecurity (05/11/2023)  Housing: Low Risk  (05/11/2023)  Transportation Needs: No Transportation Needs (05/11/2023)  Utilities: Not At Risk (05/11/2023)  Social Connections: Moderately Isolated (05/14/2023)  Tobacco Use: Low Risk  (05/15/2023)    Readmission Risk Interventions     No data to display

## 2023-05-17 NOTE — Discharge Summary (Addendum)
 Advanced Heart Failure Team  Discharge Summary   Patient ID: Tara Hardin MRN: [id number], DOB/AGE: 82/11/1940 82 y.o. Admit date: 05/11/2023 D/C date:     05/21/2023   Primary Discharge Diagnoses:  Acute systolic CHF Atrial fibrillation  Secondary Discharge Diagnoses:  Aortic stenosis Mitral regurgitation PVCs CKD IIIb  Hospital Course:  Tara Hardin is an 83 yo female with history of AF (on Eliquis ), breast cancer, aortic stenosis, LBBB and recently diagnosed systolic HF.   Seen in clinic 05/10/23 to establish with Advanced Heart Failure Clinic and noted to be volume overloaded despite attempts to diurese. Also noted to be in atrial fibrillation and hypotensive. Subsequently direct admitted for IV diuresis. Echo with EF <20%, mild to moderate MR, and moderate to severe aortic stenosis. She was started on Lasix  IV for diuresis and Milrinone  for low output. Underwent RHC on milrinone  0.25 on 12/30 which showed RA 14, PA 50/20(38), PCWP 28 w v-waves to 40 and CO/CI 4.67/3.13 and PAPi 2.1, LHC showed minimal nonocclusive. Milrinone  eventually weaned off. She underwent successful DC-CV 12/31 to NSR and was started on oral amiodarone . Of note, developed bradycardia to 30-40s with hypotension and dizziness. EP consulted for possible CRT-D and recommended decreasing amiodarone  dosing and stopping digoxin . With her softer blood pressure there has been no room to start GDMT.   She is being discharged to SNF and has follow up with Advanced Heart Failure Clinic, as well as EP clinic.     Hospital Course by Problem:  1. Acute systolic CHF: Echo in 12/23 with EF 50%, moderate-severe AS.  Developed atrial fibrillation in 6/24 but rate has been controlled.  Echo 11/24 shows EF 20-25% with septal-lateral dyssynchrony consistent with LBBB, mild RV dysfunction, possible low flow/low gradient severe AS with mean gradient 34 mmHg and AVA 0.83 cm^2, mild-moderate mitral regurgitation, dilated IVC.  Cause of drop  in EF is uncertain.  AS does not appear critical but think severe.  She is in atrial fibrillation but rate has not been uncontrolled. She has LBBB. No significant CAD on cath, nonischemic cardiomyopathy.  She was admitted with volume overload/decompensated HF. She is now s/p DCCV and in NSR.  She became bradycardic to 30-50s with softer pressures. She was initially on milrinone , now titrated off.  Per EP she is not a candidate for CRT-D with frailty, LV dilation and body mass.  - Holding off on GDMT d/t soft BP and frailty. - Will send to SNF with lasix  20 mg daily PRN.  - Digoxin  stopped with bradycardia per EP - Next step will be TAVR evaluation as outpatient 2. Atrial fibrillation: Persistent since at least 6/24.  She has been on Eliquis  but says she has missed doses occasionally.  HR 80s today.  She has a severely dilated LA, doubt she will hold NSR without anti-arrhythmic. TEE-guided DCCV on 12/31, she remains in NSR/SB today.  - Discussed amiodarone  dosing with EP this admit d/t bradycardia. Recommend to continue amio 200 mg daily as the dose has been lowered and pt is now off digoxin .  - Continue Eliquis  2.5 mg bid 3. Aortic stenosis: Reviewed echo from 11/24.  Suspect low flow/low gradient severe AS with mean gradient 34 mmHg and AVA 0.83.  TEE confirmed low flow/low gradient severe AS.  - Workup for TAVR as outpatient.  4. Mitral regurgitation: Moderate on TEE this admission. 5. PVCs: Occasionally on telemetry.  On amiodarone .  6. CKD IIIb: Scr has been between 1.2-1.4 this admit, up slightly today 1.3>1.5. - PRN  diuretic at discharge  Discharge Vitals: Blood pressure 114/62, pulse 70, temperature 97.6 F (36.4 C), temperature source Oral, resp. rate 16, height 5' 4 (1.626 m), weight 47.3 kg, SpO2 97%.  Labs: Lab Results  Component Value Date   WBC 7.2 05/21/2023   HGB 11.6 (L) 05/21/2023   HCT 35.8 (L) 05/21/2023   MCV 94.2 05/21/2023   PLT 216 05/21/2023    Recent Labs  Lab  05/21/23 0500  NA 137  K 4.1  CL 104  CO2 24  BUN 30*  CREATININE 1.49*  CALCIUM 8.2*  GLUCOSE 100*   No results found for: CHOL, HDL, LDLCALC, TRIG BNP (last 3 results) Recent Labs    05/10/23 1701  BNP 4,471.5*    ProBNP (last 3 results) No results for input(s): PROBNP in the last 8760 hours.   Diagnostic Studies/Procedures  TEE, 05/15/23: IMPRESSIONS   1. Left ventricular ejection fraction, by estimation, is <20%. The left  ventricle has severely decreased function. The left ventricular internal  cavity size was moderately dilated.   2. Right ventricular systolic function is moderately reduced. The right  ventricular size is normal. There is normal pulmonary artery systolic  pressure. The estimated right ventricular systolic pressure is 28.4 mmHg.   3. Left atrial size was moderately dilated. No left atrial/left atrial  appendage thrombus was detected.   4. Right atrial size was mild to moderately dilated.   5. The mitral valve is normal in structure. Moderate mitral valve  regurgitation. No evidence of mitral stenosis.   6. The aortic valve is tricuspid. There is severe calcifcation of the  aortic valve. Aortic valve regurgitation is mild. Severe aortic valve  stenosis. Aortic valve area, by VTI measures 0.80 cm. Aortic valve mean  gradient measures 29.0 mmHg.   7. The inferior vena cava is normal in size with <50% respiratory  variability, suggesting right atrial pressure of 8 mmHg.   8. A small pericardial effusion is present.   R/LHC 05/14/23:  Nonobstructive mild coronary disease. 2. LV and RV filling pressures remain elevated.  3. Pulmonary venous hypertension.  4. Good cardiac output on milrinone  0.25.      Discharge Medications   Allergies as of 05/21/2023   No Known Allergies      Medication List     STOP taking these medications    OVER THE COUNTER MEDICATION   RED YEAST RICE PO   triamcinolone ointment 0.1 % Commonly known  as: KENALOG       TAKE these medications    amiodarone  200 MG tablet Commonly known as: PACERONE  Take 1 tablet (200 mg total) by mouth daily.   apixaban  2.5 MG Tabs tablet Commonly known as: Eliquis  Take 1 tablet (2.5 mg total) by mouth 2 (two) times daily.   Biotin 1 MG Caps Take 1 mg by mouth daily.   Fish Oil 1000 MG Caps Take 1,000 mg by mouth daily.   furosemide  20 MG tablet Commonly known as: Lasix  Take 1 tablet as needed for weight gain more than 3 lb in a day or 5 lb in a week or worsening leg edema, shortness of breath What changed:  how much to take how to take this when to take this additional instructions   letrozole  2.5 MG tablet Commonly known as: FEMARA  Take 1 tablet (2.5 mg total) by mouth daily.   multivitamin tablet Take 1 tablet by mouth daily.   potassium chloride  SA 20 MEQ tablet Commonly known as: KLOR-CON  M Take 1  tablet as needed when taking lasix  What changed:  medication strength how much to take how to take this when to take this additional instructions        Disposition   The patient will be discharged in stable condition to home. Discharge Instructions     (HEART FAILURE PATIENTS) Call MD:  Anytime you have any of the following symptoms: 1) 3 pound weight gain in 24 hours or 5 pounds in 1 week 2) shortness of breath, with or without a dry hacking cough 3) swelling in the hands, feet or stomach 4) if you have to sleep on extra pillows at night in order to breathe.   Complete by: As directed    Diet - low sodium heart healthy   Complete by: As directed    Increase activity slowly   Complete by: As directed    PICC line removal   Complete by: As directed    STOP any activity that causes chest pain, shortness of breath, dizziness, sweating, or exessive weakness   Complete by: As directed        Follow-up Information     [facility] Follow up on 05/28/2023.   Specialty:  Cardiology Why: Advanced Heart Failure Clinic at 8:30am  Heart and Vascular Tower, Entrance C Contact information: [address] [place]  [address] [phone]        [facility] Follow up on 05/31/2023.   Specialty: Cardiology Why: Follow-up at 9 am Contact information: [address] [place]  [address] [phone]                  Duration of Discharge Encounter: Greater than 35 minutes   Signed, [facility], Lene Mckay N  05/21/2023, 10:26 AM

## 2023-05-17 NOTE — Progress Notes (Signed)
 Physical Therapy Treatment Patient Details Name: Tara Hardin MRN: [id number] DOB: 04-08-1941 Today's Date: 05/17/2023   History of Present Illness 83 yo female admitted 12/27 from heart failure clinic with volume overload. 12/30 radial and femoral access R/LHC with mild nonobstructive coronary disease and pulmonary htn. 12/31 TEE with DCCV. PMH: Afib, breast CA, aortic stenosis, LBBB and systolic heart failure.    PT Comments  Pt pleasant and reports fatigue from lack of sleep. Pt remains bradycardic today with HR 45-62 during session. Pt able to increase hall ambulation with rollator and cues for posture. Pt reports son is in [place] and she is interested in ALF but made aware she couldn't be there for just 2 weeks. HHPT still recommended and discussed with pt. Pt educated for walking program and LB HEP.       If plan is discharge home, recommend the following: Assistance with cooking/housework   Can travel by private vehicle        Equipment Recommendations  None recommended by PT    Recommendations for Other Services       Precautions / Restrictions Precautions Precautions: Other (comment);Fall Precaution Comments: 12/30 Rt radial cath, watch HR Restrictions Weight Bearing Restrictions Per Provider Order: No     Mobility  Bed Mobility Overal bed mobility: Modified Independent Bed Mobility: Supine to Sit, Sit to Supine     Supine to sit: Supervision Sit to supine: Supervision   General bed mobility comments: pt able to transition supine<>sit with min cues to limit RUE pushing    Transfers Overall transfer level: Modified independent                 General transfer comment: pt able to stand from bed x 2 trials with cues not to push with RUE    Ambulation/Gait Ambulation/Gait assistance: Supervision Gait Distance (Feet): 400 Feet Assistive device: Rollator (4 wheels) Gait Pattern/deviations: Step-through pattern, Trunk flexed   Gait velocity interpretation:  1.31 - 2.62 ft/sec, indicative of limited community ambulator   General Gait Details: cues for posture and proximity to rollator as well as directional cues   Stairs             Wheelchair Mobility     Tilt Bed    Modified Rankin (Stroke Patients Only)       Balance Overall balance assessment: Mild deficits observed, not formally tested                                          Cognition Arousal: Alert Behavior During Therapy: WFL for tasks assessed/performed Overall Cognitive Status: Within Functional Limits for tasks assessed                                          Exercises General Exercises - Lower Extremity Long Arc Quad: AROM, Both, 10 reps, Seated Hip Flexion/Marching: AROM, Both, 20 reps, Seated    General Comments        Pertinent Vitals/Pain Pain Assessment Pain Assessment: No/denies pain    Home Living                          Prior Function            PT Goals (current goals can now be found  in the care plan section) Progress towards PT goals: Progressing toward goals    Frequency    Min 1X/week      PT Plan      Co-evaluation              AM-PAC PT 6 Clicks Mobility   Outcome Measure  Help needed turning from your back to your side while in a flat bed without using bedrails?: None Help needed moving from lying on your back to sitting on the side of a flat bed without using bedrails?: A Little Help needed moving to and from a bed to a chair (including a wheelchair)?: A Little Help needed standing up from a chair using your arms (e.g., wheelchair or bedside chair)?: A Little Help needed to walk in hospital room?: A Little Help needed climbing 3-5 steps with a railing? : A Little 6 Click Score: 19    End of Session   Activity Tolerance: Patient tolerated treatment well Patient left: in bed (pt denied OOB to chair due to fatigue) Nurse Communication: Mobility status PT  Visit Diagnosis: Other abnormalities of gait and mobility (R26.89)     Time: 1010-1032 PT Time Calculation (min) (ACUTE ONLY): 22 min  Charges:    $Gait Training: 8-22 mins PT General Charges $$ ACUTE PT VISIT: 1 Visit                     Lenoard SQUIBB, PT Acute Rehabilitation Services Office: [phone]    Lenoard NOVAK [name] 05/17/2023, 10:54 AM

## 2023-05-17 NOTE — Plan of Care (Signed)
  Problem: Education: Goal: Knowledge of General Education information will improve Description: Including pain rating scale, medication(s)/side effects and non-pharmacologic comfort measures Outcome: Progressing   Problem: Health Behavior/Discharge Planning: Goal: Ability to manage health-related needs will improve Outcome: Progressing   Problem: Clinical Measurements: Goal: Respiratory complications will improve Outcome: Progressing Goal: Cardiovascular complication will be avoided Outcome: Progressing   Problem: Nutrition: Goal: Adequate nutrition will be maintained Outcome: Progressing   Problem: Skin Integrity: Goal: Risk for impaired skin integrity will decrease Outcome: Progressing   

## 2023-05-17 NOTE — Plan of Care (Signed)

## 2023-05-17 NOTE — Progress Notes (Signed)
 Patient with bradycardia throughout shift. From 1930 to 0130 ish  patient was in the 50s consistently. The HR in the 50s slowly trended down into the 40s where it sat consistently.   At 0440, telemetry called to report patient's HR had gone down into the 30s, with 33 being the lowest. During this time, patient had 2 pauses with the longest being 2.3 seconds. Patient asymptomatic and sleeping during this time.  Dr. Duffy, Cardiology was paged at 0445 regarding the pause and further decline of bradycardia.

## 2023-05-17 NOTE — Progress Notes (Signed)
 Mobility Specialist Progress Note:    05/17/23 0900  Mobility  Activity Transferred to/from [facility]  Level of Assistance Standby assist, set-up cues, supervision of patient - no hands on  Assistive Device None  Distance Ambulated (ft) 4 ft  Activity Response Tolerated well  Mobility Referral Yes  Mobility visit 1 Mobility  Mobility Specialist Start Time (ACUTE ONLY) 0912  Mobility Specialist Stop Time (ACUTE ONLY) 0915  Mobility Specialist Time Calculation (min) (ACUTE ONLY) 3 min   Pt received on BSC, having no complaints. Able to stand and pivot back to bed w/ standby assist. No complaints throughout. Declined ambulation d/t fatigue. Aware PT is coming later. Pt left in bed with call bell and all needs met.  D'Vante Nicholaus Mobility Specialist Please contact via Special Educational Needs Teacher or Rehab office at [phone]

## 2023-05-17 NOTE — TOC Progression Note (Addendum)
 Transition of Care [facility]) - Progression Note    Patient Details  Name: Tara Hardin MRN: [id number] Date of Birth: [birth year]  [facility]) CM/SW Contact  [name] Phone Number: [phone] 05/17/2023, 12:39 PM  Clinical Narrative:   HF CSW received a call from  [facility] and stated that coordinator, Sabrina Parkin will come to do an assessment around 3pm today. CSW updated the pts FL2 and faxed out to different ALFs. Offers pending. Per HF CM the family is interested in [facility].   CSW called and spoke with DIL, Gerren [phone]  to share updates about the ALF process and next steps.   TOC will continue following.     Expected Discharge Plan: Assisted Living Barriers to Discharge: Continued Medical Work up  Expected Discharge Plan and Services   Discharge Planning Services: CM Consult   Living arrangements for the past 2 months: Single Family Home                                       Social Determinants of Health (SDOH) Interventions SDOH Screenings   Food Insecurity: No Food Insecurity (05/11/2023)  Housing: Low Risk  (05/11/2023)  Transportation Needs: No Transportation Needs (05/11/2023)  Utilities: Not At Risk (05/11/2023)  Social Connections: Moderately Isolated (05/14/2023)  Tobacco Use: Low Risk  (05/15/2023)    Readmission Risk Interventions     No data to display

## 2023-05-17 NOTE — Consult Note (Signed)
 ELECTROPHYSIOLOGY CONSULT NOTE    Patient ID: Tara Hardin MRN: [id number], DOB/AGE: 82/11/1940 81 y.o.  Admit date: 05/11/2023 Date of Consult: 05/17/2023  Primary Physician: Claudene Pellet, MD Primary Cardiologist: Lonni Cash, MD  Electrophysiologist: none     Patient Profile: Tara Hardin is a 83 y.o. female with a history of HFrEF, mod-sev AS, persis AFib, breast Ca, LBBB who is being seen today for the evaluation of bradycardia on amiodarone  at the request of Dr. Zenaida.  HPI:  Tara Hardin is a 83 y.o. female with PMH as above.   She developed AFib 10/2022 and was started on eliquis . Routine TTE 02/2023 showed LVEF < 20% with LAE, severe AS. Previously LVEF was 50%.  She was seen in HF clinic 12/26 to establish care, c/o increased BLE up to hips despite doubling lasix  with borderline BP. She was admitted from HF clinic for IV diuresis. She was in rate-controlled AFib and started on PO amiodarone  400mg  BID at admission. Required milrinone  for diuresis  She had [facility] 12/29 with mild CAD, elevated LV and RV filling pressures, good CO on milrinone  0.25 She is s/p TEE/DCCV 12/31 with successful conversion to SR after 3 shocks. She was bradycardic overnight into 30-40s with soft BP, having some nausea. Amiodarone  was held this AM   Currently, she c/o queasiness . No chest pain, chest pressure, SOB  Labs Potassium4.5 (01/02 0515)   Creatinine, ser  1.36* (01/02 0515) PLT  155 (01/02 0515) HGB  10.5* (01/02 0515) WBC 5.4 (01/02 0515)  .    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    Nonrheumatic tricuspid (valve) insufficiency 07/21/2020   Prediabetes 08/30/2020     Surgical History:  Past Surgical History:  Procedure Laterality Date   BREAST LUMPECTOMY WITH RADIOACTIVE SEED LOCALIZATION Left 08/11/2021   Procedure: LEFT BREAST LUMPECTOMY WITH RADIOACTIVE SEED LOCALIZATION X2;  Surgeon: Curvin Deward MOULD, MD;  Location: [facility];  Service: General;  Laterality: Left;   CARDIOVERSION N/A 05/15/2023   Procedure: CARDIOVERSION;  Surgeon: Rolan Ezra RAMAN, MD;  Location: [facility] INVASIVE CV LAB;  Service: Cardiovascular;  Laterality: N/A;   RE-EXCISION OF BREAST CANCER,SUPERIOR MARGINS Left 10/07/2021   Procedure: RE-EXCISION OF LEFT BREAST INFERIOR MARGIN;  Surgeon: Curvin Deward MOULD, MD;  Location: [facility];  Service: General;  Laterality: Left;   RIGHT HEART CATH AND CORONARY ANGIOGRAPHY N/A 05/14/2023   Procedure: RIGHT HEART CATH AND CORONARY ANGIOGRAPHY;  Surgeon: Rolan Ezra RAMAN, MD;  Location: [facility] INVASIVE CV LAB;  Service: Cardiovascular;  Laterality: N/A;   TRANSESOPHAGEAL ECHOCARDIOGRAM (CATH LAB) N/A 05/15/2023   Procedure: TRANSESOPHAGEAL ECHOCARDIOGRAM;  Surgeon: Rolan Ezra RAMAN, MD;  Location: [facility] INVASIVE CV LAB;  Service: Cardiovascular;  Laterality: N/A;     Medications Prior to Admission  Medication Sig Dispense Refill Last Dose/Taking   apixaban  (ELIQUIS ) 2.5 MG TABS tablet Take 1 tablet (2.5 mg total) by mouth 2 (two) times daily. 60 tablet 5 05/10/2023   Biotin 1 MG CAPS Take 1 mg by mouth daily.   Past Week   furosemide  (LASIX ) 20 MG tablet  Take 3 tablets (60 mg total) by mouth daily. May take a extra tablet for increase weight gain of 2 pounds 90 tablet 3 05/10/2023   letrozole  (FEMARA ) 2.5 MG tablet Take 1 tablet (2.5 mg total) by mouth daily. 90 tablet 3 05/10/2023   Multiple Vitamin (MULTIVITAMIN) tablet Take 1 tablet by mouth daily.   05/10/2023   Omega-3 Fatty Acids (FISH OIL) 1000 MG CAPS Take 1,000 mg by mouth daily.    Past Week   OVER THE COUNTER MEDICATION Take 5,000 mcg by mouth. Garlinase   Past Week   potassium chloride  (KLOR-CON  M) 10 MEQ tablet Take 10 mEq by mouth daily.   05/10/2023   Red Yeast Rice Extract (RED YEAST RICE PO) Take 1 tablet by mouth daily.   Past Week   triamcinolone ointment (KENALOG) 0.1 % Apply 1 application topically 2 (two) times daily.   Past Week    Inpatient Medications:   apixaban   2.5 mg Oral BID   Chlorhexidine  Gluconate Cloth  6 each Topical Daily   digoxin   0.0625 mg Oral Daily   letrozole   2.5 mg Oral Daily   sodium chloride  flush  10-40 mL Intracatheter Q12H   sodium chloride  flush  3 mL Intravenous Q12H   sodium chloride  flush  3 mL Intravenous Q12H    Allergies: No Known Allergies  Family History  Problem Relation Age of Onset   Lung cancer Mother        d. 34   Heart attack Sister    Hypertension Sister    Atrial fibrillation Brother    Hypertension Brother    Heart attack Brother    Irregular heart beat Brother    Breast cancer Paternal Aunt        dx after 72   Breast cancer Cousin        paternal female cousin     Physical Exam: Vitals:   05/17/23 0200 05/17/23 0253 05/17/23 0748 05/17/23 0824  BP: (!) 92/40 97/61 105/64   Pulse:  (!) 50 (!) 55 (!) 45  Resp:  16    Temp:  97.6 F (36.4 C) (!) 97.3 F (36.3 C)   TempSrc:  Oral Oral   SpO2:  93% 93%   Weight:  47.7 kg    Height:        GEN- NAD, A&O x 3, normal affect, frail HEENT: Normocephalic, atraumatic Lungs- CTAB, Normal effort.  Heart- Regular,bradycardic rate and rhythm, GI- Soft, NT, ND.  Extremities- No clubbing, cyanosis, or edema   Radiology/Studies: DG CHEST PORT 1 VIEW Result Date: 05/17/2023 CLINICAL DATA:  Pulmonary edema EXAM: PORTABLE CHEST 1 VIEW COMPARISON:  05/12/2023. FINDINGS: Stable right-sided PICC. Enlarged cardiopericardial silhouette with calcified aorta. Persistent prominence of the vasculature. Increasing small pleural effusions,  right-greater-than-left with some adjacent opacity. No pneumothorax. Calcified aorta. Overlapping cardiac leads. IMPRESSION: Increasing small pleural effusions, right-greater-than-left with the adjacent opacities. Enlarged heart with persistent prominence of the central vasculature. Electronically Signed   By: Ranell Bring M.D.   On: 05/17/2023 09:33   ECHO TEE Result Date: 05/15/2023    TRANSESOPHOGEAL ECHO REPORT   Patient Name:   CRYSTINA BORRAYO Date of Exam: 05/15/2023 Medical Rec #:  [id number]      Height:       64.0 in Accession #:    [id number]     Weight:       105.0 lb Date of Birth:  February 27, 1941       BSA:  1.488 m Patient Age:    82 years       BP:           114/65 mmHg Patient Gender: F              HR:           73 bpm. Exam Location:  Inpatient Procedure: Transesophageal Echo, Cardiac Doppler and Color Doppler Indications:     Atrial Fibrillation  History:         Patient has prior history of Echocardiogram examinations, most                  recent 04/05/2023. CHF, Signs/Symptoms:Dyspnea; Risk                  Factors:Dyslipidemia.  Sonographer:     Thea Norlander RCS Referring Phys:  [id number] JORDAN LEE Diagnosing Phys: Ezra Kanner PROCEDURE: After discussion of the risks and benefits of a TEE, an informed consent was obtained from the patient. The transesophogeal probe was passed without difficulty through the esophogus of the patient. Imaged were obtained with the patient in a left lateral decubitus position. Sedation performed by different physician. The patient was monitored while under deep sedation. Anesthestetic sedation was provided intravenously by Anesthesiology: 60mg  of Propofol , 40mg  of Lidocaine . The patient developed no complications during the procedure. A successful direct current cardioversion was performed at 360 joules with 3 attempts.  IMPRESSIONS  1. Left ventricular ejection fraction, by estimation, is <20%. The left ventricle has severely decreased function. The  left ventricular internal cavity size was moderately dilated.  2. Right ventricular systolic function is moderately reduced. The right ventricular size is normal. There is normal pulmonary artery systolic pressure. The estimated right ventricular systolic pressure is 28.4 mmHg.  3. Left atrial size was moderately dilated. No left atrial/left atrial appendage thrombus was detected.  4. Right atrial size was mild to moderately dilated.  5. The mitral valve is normal in structure. Moderate mitral valve regurgitation. No evidence of mitral stenosis.  6. The aortic valve is tricuspid. There is severe calcifcation of the aortic valve. Aortic valve regurgitation is mild. Severe aortic valve stenosis. Aortic valve area, by VTI measures 0.80 cm. Aortic valve mean gradient measures 29.0 mmHg.  7. The inferior vena cava is normal in size with <50% respiratory variability, suggesting right atrial pressure of 8 mmHg.  8. A small pericardial effusion is present. FINDINGS  Left Ventricle: Left ventricular ejection fraction, by estimation, is <20%. The left ventricle has severely decreased function. The left ventricular internal cavity size was moderately dilated. There is no left ventricular hypertrophy. Right Ventricle: The right ventricular size is normal. No increase in right ventricular wall thickness. Right ventricular systolic function is moderately reduced. There is normal pulmonary artery systolic pressure. The tricuspid regurgitant velocity is 2.26 m/s, and with an assumed right atrial pressure of 8 mmHg, the estimated right ventricular systolic pressure is 28.4 mmHg. Left Atrium: Left atrial size was moderately dilated. No left atrial/left atrial appendage thrombus was detected. Right Atrium: Right atrial size was mild to moderately dilated. Pericardium: A small pericardial effusion is present. Mitral Valve: The mitral valve is normal in structure. Mild to moderate mitral annular calcification. Moderate mitral valve  regurgitation. No evidence of mitral valve stenosis. Tricuspid Valve: The tricuspid valve is normal in structure. Tricuspid valve regurgitation is mild. Aortic Valve: The aortic valve is tricuspid. There is severe calcifcation of the aortic valve. Aortic valve regurgitation is mild. Severe aortic  stenosis is present. Aortic valve mean gradient measures 29.0 mmHg. Aortic valve peak gradient measures 47.9 mmHg. Aortic valve area, by VTI measures 0.80 cm. Pulmonic Valve: The pulmonic valve was normal in structure. Pulmonic valve regurgitation is not visualized. Aorta: The aortic root is normal in size and structure. Venous: The inferior vena cava is normal in size with less than 50% respiratory variability, suggesting right atrial pressure of 8 mmHg. IAS/Shunts: No atrial level shunt detected by color flow Doppler. Additional Comments: Spectral Doppler performed. LEFT VENTRICLE PLAX 2D LVOT diam:     2.25 cm LV SV:         58 LV SV Index:   39 LVOT Area:     3.98 cm  IVC IVC diam: 1.70 cm AORTIC VALVE AV Area (Vmax):    0.85 cm AV Area (Vmean):   0.85 cm AV Area (VTI):     0.80 cm AV Vmax:           346.00 cm/s AV Vmean:          228.500 cm/s AV VTI:            0.717 m AV Peak Grad:      47.9 mmHg AV Mean Grad:      29.0 mmHg LVOT Vmax:         74.20 cm/s LVOT Vmean:        48.600 cm/s LVOT VTI:          0.145 m LVOT/AV VTI ratio: 0.20 MR Peak grad:    102.0 mmHg   TRICUSPID VALVE MR Mean grad:    63.0 mmHg    TR Peak grad:   20.4 mmHg MR Vmax:         505.00 cm/s  TR Vmax:        226.00 cm/s MR Vmean:        368.0 cm/s MR PISA:         2.26 cm     SHUNTS MR PISA Eff ROA: 17 mm       Systemic VTI:  0.14 m MR PISA Radius:  0.60 cm      Systemic Diam: 2.25 cm Dalton McleanMD Electronically signed by Ezra Kanner Signature Date/Time: 05/15/2023/7:25:36 PM    Final    EP STUDY Result Date: 05/15/2023 See surgical note for result.  CARDIAC CATHETERIZATION Result Date: 05/14/2023 1. Nonobstructive mild  coronary disease. 2. LV and RV filling pressures remain elevated. 3. Pulmonary venous hypertension. 4. Good cardiac output on milrinone  0.25.   DG CHEST PORT 1 VIEW Result Date: 05/12/2023 CLINICAL DATA:  Check PICC placement EXAM: PORTABLE CHEST 1 VIEW COMPARISON:  05/12/2023 FINDINGS: Cardiac shadow remains enlarged. Aortic calcifications are again seen. Right PICC is noted with catheter tip at the cavoatrial junction in satisfactory position. No focal confluent infiltrate is seen. No bony abnormality is noted. IMPRESSION: Right PICC in satisfactory position. Electronically Signed   By: Oneil Devonshire M.D.   On: 05/12/2023 18:28   US  EKG SITE RITE Result Date: 05/12/2023 If Site Rite image not attached, placement could not be confirmed due to current cardiac rhythm.  DG Chest Port 1 View Result Date: 05/12/2023 CLINICAL DATA:  Chronic systolic congestive heart failure. EXAM: PORTABLE CHEST 1 VIEW COMPARISON:  None Available. FINDINGS: Mild cardiomegaly is noted. Right lung is clear. Minimal left basilar atelectasis or possibly infiltrate is noted with possible small left pleural effusion. Bony thorax is unremarkable. IMPRESSION: Minimal left basilar atelectasis or possibly infiltrate is noted with  possible small left pleural effusion. Electronically Signed   By: Lynwood Landy Raddle M.D.   On: 05/12/2023 08:52    EKG: 05/10/23 at 1539 - AFib, rate 91bpm; LBBB, LAD  05/16/2023 at 1153 - SR with 1st deg HB, rate 60bpm; LAD, LBBB - PR 320 (personally reviewed)  TELEMETRY: SR in 40-60s (personally reviewed)    Assessment/Plan:  #) Persis AFib #) bradycardia #) severe AS #) MR Patient first diagnosed with AFib 10/2022, rates well-controlled Was started on amiodarone  400mg  BID 12/27 S/p successful TEE/DCCV  Now bradycardic to 40-60s, so amiodarone  held and digoxin  continued Would re-start amiodarone  at 200mg  BID  Stop digoxin  Keep K > 4, Mag > 2   #) HFrEF Thought to be d/t arhythmia   Appears euvolemic at this time Given age, frailty, valvular disease, and BMI, do not favor CRT-D    EP will sign off at this time, but remains available. Please reconsult if needed. AF clinic appt scheduled       For questions or updates, please contact [facility] Please consult [email or web address] for contact info under Cardiology/STEMI.  Signed, Deshaun Schou, NP  05/17/2023 10:40 AM

## 2023-05-17 NOTE — Progress Notes (Addendum)
 Patient ID: Tara Hardin, female   DOB: 02-Aug-1940, 83 y.o.   MRN: [id number]     Advanced Heart Failure Rounding Note  Cardiologist: Lonni Cash, MD   Subjective:   Chief Complaint: shortness of breath  Sinus bradycardic overnight 30-40s. BP soft.  Co-ox 54%. Creatinine slightly up 1.15>1.36  L/RHC 12/30: mild cor disease (30% Cx stenosis), RA 14, PA 80/20 (38) PCWP 28, v-waves to 40, CO/CI (fick) 4.67/3.13, PAPi 2.1 TEE-DCCV on 12/31: TEE showed EF 20-25% range, moderate RV dysfunction, low flow/low gradient severe AS, moderate MR.  IVC was not dilated.  She converted to NSR with difficulty (3 shocks).   Nauseated this am. Did not sleep well last night. No SOB, CP or dizziness.   Objective:   Weight Range: 47.7 kg Body mass index is 18.05 kg/m.   Vital Signs:   Temp:  [97.6 F (36.4 C)-98.6 F (37 C)] 97.6 F (36.4 C) (01/02 0253) Pulse Rate:  [50-66] 50 (01/02 0253) Resp:  [16] 16 (01/02 0253) BP: (72-115)/(40-62) 97/61 (01/02 0253) SpO2:  [89 %-98 %] 93 % (01/02 0253) Weight:  [47.7 kg] 47.7 kg (01/02 0253) Last BM Date : 05/15/23  Weight change: Filed Weights   05/15/23 0436 05/16/23 0422 05/17/23 0253  Weight: 47.6 kg 46.9 kg 47.7 kg   Intake/Output:   Intake/Output Summary (Last 24 hours) at 05/17/2023 9297 Last data filed at 05/16/2023 1954 Gross per 24 hour  Intake 240 ml  Output --  Net 240 ml    Physical Exam    CVP <5 General: Frail appearing. No distress on RA HEENT: neck supple.   Cardiac: JVP ~8cm . S1 and S2 present. 3/6 AS murmur. Resp: Lung sounds clear and equal B/L Abdomen: Soft, non-tender, non-distended. + BS. Extremities: Warm and dry. No rash, cyanosis.  No peripherial edema.  Neuro: Alert and oriented x3. Affect pleasant. Moves all extremities without difficulty. Lines/Devices:  RUE PICC  Telemetry   SB in 40s with LBBB (personally reviewed)  Labs    CBC Recent Labs    05/16/23 0447 05/17/23 0515  WBC 7.1 5.4  HGB  10.9* 10.5*  HCT 33.1* 32.2*  MCV 93.8 94.2  PLT 170 155   Basic Metabolic Panel Recent Labs    98/98/74 0447 05/17/23 0515  NA 139 137  K 4.3 4.5  CL 103 100  CO2 30 28  GLUCOSE 123* 112*  BUN 22 26*  CREATININE 1.15* 1.36*  CALCIUM 8.4* 8.4*   Liver Function Tests No results for input(s): AST, ALT, ALKPHOS, BILITOT, PROT, ALBUMIN in the last 72 hours. No results for input(s): LIPASE, AMYLASE in the last 72 hours. Cardiac Enzymes No results for input(s): CKTOTAL, CKMB, CKMBINDEX, TROPONINI in the last 72 hours.  BNP: BNP (last 3 results) Recent Labs    05/10/23 1701  BNP 4,471.5*   ProBNP (last 3 results) No results for input(s): PROBNP in the last 8760 hours.  D-Dimer No results for input(s): DDIMER in the last 72 hours. Hemoglobin A1C No results for input(s): HGBA1C in the last 72 hours.  Fasting Lipid Panel No results for input(s): CHOL, HDL, LDLCALC, TRIG, CHOLHDL, LDLDIRECT in the last 72 hours. Thyroid Function Tests No results for input(s): TSH, T4TOTAL, T3FREE, THYROIDAB in the last 72 hours.  Invalid input(s): FREET3  Other results:  Imaging   No results found.  Medications:    Scheduled Medications:  amiodarone   400 mg Oral BID   apixaban   2.5 mg Oral BID   Chlorhexidine  Gluconate  Cloth  6 each Topical Daily   digoxin   0.0625 mg Oral Daily   letrozole   2.5 mg Oral Daily   potassium chloride   20 mEq Oral Daily   sodium chloride  flush  10-40 mL Intracatheter Q12H   sodium chloride  flush  3 mL Intravenous Q12H   sodium chloride  flush  3 mL Intravenous Q12H   torsemide   20 mg Oral Daily   Infusions:  milrinone  Stopped (05/15/23 1027)   PRN Medications: acetaminophen , acetaminophen , bismuth  subsalicylate, diphenhydrAMINE , hydrocortisone  cream, ondansetron  (ZOFRAN ) IV, mouth rinse, sodium chloride  flush, sodium chloride  flush, sodium chloride  flush  Assessment/Plan   1. Acute systolic  CHF: Echo in 12/23 with EF 50%, moderate-severe AS.  Developed atrial fibrillation in 6/24 but rate has been controlled.  I reviewed the echo done 11/24, this shows EF 20-25% with septal-lateral dyssynchrony consistent with LBBB, mild RV dysfunction, possible low flow/low gradient severe AS with mean gradient 34 mmHg and AVA 0.83 cm^2, mild-moderate mitral regurgitation, dilated IVC.  Cause of drop in EF is uncertain.  AS does not appear critical but think severe.  She is in atrial fibrillation but rate has not been uncontrolled. She has LBBB. No significant CAD on cath, nonischemic cardiomyopathy.  She was admitted with volume overload/decompensated HF. She is now s/p DCCV and in NSR.  Now bradycardic to 30-50s with softer pressures, consult to EP for possible CRT. CVP < 5, co-ox 54%.  She was initially on milrinone , now titrated off.  No BP room for additional meds.  - Will hold on diuresis with soft BP and bradycardia  - Continue low dose digoxin  0.0625, continue as long as renal function remains stable. Check level in am.  - Next step will be TAVR evaluation as outpatient 2. Atrial fibrillation: Persistent since at least 6/24.  She has been on Eliquis  but says she has missed doses occasionally.  HR 80s today.  She has a severely dilated LA, doubt she will hold NSR without anti-arrhythmic. TEE-guided DCCV on 12/31, she remains in NSR today.  - Hold amiodarone  for bradycardiac - Continue to Eliquis  2.5 mg bid - Consult to EP for possible CRT-D 3. Aortic stenosis: I reviewed echo from 11/24.  Suspect low flow/low gradient severe AS with mean gradient 34 mmHg and AVA 0.83.  TEE confirmed low flow/low gradient severe AS.  - Workup for TAVR as outpatient.  4. Mitral regurgitation: Moderate on TEE this admission. 5. PVCs: Occasionally on telemetry.  On amiodarone .  6. AKI: Creatinine bump with hypotension 1.15>1.36   Length of Stay: 6  Jordan Lee, NP  05/17/2023, 7:02 AM  Advanced Heart Failure  Team Pager [phone] (M-F; 7a - 5p)  Please contact [facility] for night-coverage after hours (5p -7a ) and weekends on [email or web address]   Patient seen with NP, agree with the above note.   HR 30s-40s overnight, now 40s.  She is more nauseated today and had some lightheadedness.   CVP < 5, co-ox 54%.    General: NAD, frail/thin Neck: No JVD, no thyromegaly or thyroid nodule.  Lungs: Clear to auscultation bilaterally with normal respiratory effort. CV: Nondisplaced PMI.  Heart brady, regular S1/S2, no S3/S4, 2/6 SEM RUSB.  No peripheral edema.   Abdomen: Soft, nontender, no hepatosplenomegaly, no distention.  Skin: Intact without lesions or rashes.  Neurologic: Alert and oriented x 3.  Psych: Normal affect. Extremities: No clubbing or cyanosis.  HEENT: Normal.   Now with sinus bradycardia. Hold amiodarone  today and restart at 200 mg daily tomorrow.  Stop digoxin .  Not a great candidate for pacemaker (would be CRT), she is very thin and is anticoagulated.   Volume status looks ok, will not start torsemide  yet.  Creatinine up a bit to 1.36.  Reassess tomorrow.   Will need workup for TAVR as outpatient.   Eventually, will need SNF.   Ezra Shuck 05/17/2023 2:18 PM

## 2023-05-18 DIAGNOSIS — I5043 Acute on chronic combined systolic (congestive) and diastolic (congestive) heart failure: Secondary | ICD-10-CM | POA: Diagnosis not present

## 2023-05-18 LAB — COOXEMETRY PANEL
Carboxyhemoglobin: 1.6 % — ABNORMAL HIGH (ref 0.5–1.5)
Methemoglobin: <0.7 % (ref 0.0–1.5)
O2 Saturation: 58.5 %
Total hemoglobin: 11.7 g/dL — ABNORMAL LOW (ref 12.0–16.0)

## 2023-05-18 LAB — CBC
HCT: 34.3 % — ABNORMAL LOW (ref 36.0–46.0)
Hemoglobin: 11.2 g/dL — ABNORMAL LOW (ref 12.0–15.0)
MCH: 30.6 pg (ref 26.0–34.0)
MCHC: 32.7 g/dL (ref 30.0–36.0)
MCV: 93.7 fL (ref 80.0–100.0)
Platelets: 175 10*3/uL (ref 150–400)
RBC: 3.66 MIL/uL — ABNORMAL LOW (ref 3.87–5.11)
RDW: 15 % (ref 11.5–15.5)
WBC: 6.3 10*3/uL (ref 4.0–10.5)
nRBC: 0 % (ref 0.0–0.2)

## 2023-05-18 LAB — BASIC METABOLIC PANEL
Anion gap: 8 (ref 5–15)
BUN: 25 mg/dL — ABNORMAL HIGH (ref 8–23)
CO2: 28 mmol/L (ref 22–32)
Calcium: 8.5 mg/dL — ABNORMAL LOW (ref 8.9–10.3)
Chloride: 103 mmol/L (ref 98–111)
Creatinine, Ser: 1.43 mg/dL — ABNORMAL HIGH (ref 0.44–1.00)
GFR, Estimated: 37 mL/min — ABNORMAL LOW (ref >60)
Glucose, Bld: 101 mg/dL — ABNORMAL HIGH (ref 70–99)
Potassium: 4.3 mmol/L (ref 3.5–5.1)
Sodium: 139 mmol/L (ref 135–145)

## 2023-05-18 MED ORDER — TUBERCULIN PPD 5 UNIT/0.1ML ID SOLN
5.0000 [IU] | Freq: Once | INTRADERMAL | Status: AC
  Administered 2023-05-18: 5 [IU] via INTRADERMAL
  Filled 2023-05-18: qty 0.1

## 2023-05-18 NOTE — TOC Progression Note (Addendum)
 Transition of Care [facility]) - Progression Note    Patient Details  Name: Tara Hardin MRN: [id number] Date of Birth: 07-27-1940  [facility] CM/SW Contact  Arlana JINNY Moats, LCSWA Phone Number: 05/18/2023, 9:10 AM  Clinical Narrative:   HF CSW called Juanita from [facility] to follow up about yesterdays visit. Juanita stated that they can accept the pt. Juanita was also with [facility] who stated that he is going to email CSW forms that need to be completed. Pt will need a TB screen and updated FL2. Once email is received CSW will work on getting the forms completed.   CSW called and spoke with DIL, Gerren [phone] to let her know that [facility] offered a bed and that we are waiting for pts TB skin results and may be dc Sunday or Monday while we wait for results. DIL confirmed that they are moving in the pts belongings over the weekend and Sunday the room will be set.   CSW met with nurse to have vaccine portion of [facility] paperwork completed. Once completed the forms will be left on the pts chart. The remaining portions of the paperwork need to be completed before dc.   TOC will continue following.    Expected Discharge Plan: Assisted Living Barriers to Discharge: Continued Medical Work up  Expected Discharge Plan and Services   Discharge Planning Services: CM Consult   Living arrangements for the past 2 months: Single Family Home                                       Social Determinants of Health (SDOH) Interventions SDOH Screenings   Food Insecurity: No Food Insecurity (05/11/2023)  Housing: Low Risk  (05/11/2023)  Transportation Needs: No Transportation Needs (05/11/2023)  Utilities: Not At Risk (05/11/2023)  Social Connections: Moderately Isolated (05/14/2023)  Tobacco Use: Low Risk  (05/15/2023)    Readmission Risk Interventions     No data to display

## 2023-05-18 NOTE — Progress Notes (Addendum)
 Patient ID: Tara Hardin, female   DOB: 06/23/1940, 83 y.o.   MRN: [id number]     Advanced Heart Failure Rounding Note  Cardiologist: Lonni Cash, MD   Chief Complaint: shortness of breath  Subjective:    Still getting SB overnight and some this morning. 30s-50s  Co-ox 58%. Creatinine slightly up 1.15>1.36>1.43. CVP 2/3  Feels good this morning. Sitting in bed eating breakfast. Denies CP/SOB.   Objective:   L/RHC 12/30: mild cor disease (30% Cx stenosis), RA 14, PA 80/20 (38) PCWP 28, v-waves to 40, CO/CI (fick) 4.67/3.13, PAPi 2.1 TEE-DCCV on 12/31: TEE showed EF 20-25% range, moderate RV dysfunction, low flow/low gradient severe AS, moderate MR.  IVC was not dilated.  She converted to NSR with difficulty (3 shocks).   Weight Range: 47.8 kg Body mass index is 18.09 kg/m.   Vital Signs:   Temp:  [97.3 F (36.3 C)-98.5 F (36.9 C)] 97.8 F (36.6 C) (01/03 0716) Pulse Rate:  [40-64] 40 (01/03 0716) Resp:  [16-18] 16 (01/03 0716) BP: (93-106)/(44-59) 98/52 (01/03 0716) SpO2:  [96 %-98 %] 96 % (01/03 0430) Weight:  [47.8 kg] 47.8 kg (01/03 0532) Last BM Date : 05/17/23  Weight change: Filed Weights   05/16/23 0422 05/17/23 0253 05/18/23 0532  Weight: 46.9 kg 47.7 kg 47.8 kg   Intake/Output:   Intake/Output Summary (Last 24 hours) at 05/18/2023 0835 Last data filed at 05/18/2023 0635 Gross per 24 hour  Intake 240 ml  Output 325 ml  Net -85 ml    Physical Exam   CVP 2/3 General:  elderly appearing.  No respiratory difficulty HEENT: normal Neck: supple. JVD flat. Carotids 2+ bilat; no bruits. No lymphadenopathy or thyromegaly appreciated. Cor: PMI nondisplaced. Brady rate & regular rhythm. No rubs, gallops or murmurs. Lungs: clear Abdomen: soft, nontender, nondistended. No hepatosplenomegaly. No bruits or masses. Good bowel sounds. Extremities: no cyanosis, clubbing, rash, edema. PICC RUE Neuro: alert & oriented x 3, cranial nerves grossly intact. moves all 4  extremities w/o difficulty. Affect pleasant.   Telemetry   SB in 50s, random 1 second pauses. HR as low as 30s overnight (personally reviewed)  Labs    CBC Recent Labs    05/17/23 0515 05/18/23 0420  WBC 5.4 6.3  HGB 10.5* 11.2*  HCT 32.2* 34.3*  MCV 94.2 93.7  PLT 155 175   Basic Metabolic Panel Recent Labs    98/97/74 0515 05/18/23 0420  NA 137 139  K 4.5 4.3  CL 100 103  CO2 28 28  GLUCOSE 112* 101*  BUN 26* 25*  CREATININE 1.36* 1.43*  CALCIUM 8.4* 8.5*   Liver Function Tests No results for input(s): AST, ALT, ALKPHOS, BILITOT, PROT, ALBUMIN in the last 72 hours. No results for input(s): LIPASE, AMYLASE in the last 72 hours. Cardiac Enzymes No results for input(s): CKTOTAL, CKMB, CKMBINDEX, TROPONINI in the last 72 hours.  BNP: BNP (last 3 results) Recent Labs    05/10/23 1701  BNP 4,471.5*   ProBNP (last 3 results) No results for input(s): PROBNP in the last 8760 hours.  D-Dimer No results for input(s): DDIMER in the last 72 hours. Hemoglobin A1C No results for input(s): HGBA1C in the last 72 hours.  Fasting Lipid Panel No results for input(s): CHOL, HDL, LDLCALC, TRIG, CHOLHDL, LDLDIRECT in the last 72 hours. Thyroid Function Tests No results for input(s): TSH, T4TOTAL, T3FREE, THYROIDAB in the last 72 hours.  Invalid input(s): FREET3  Other results:  Imaging   No results found.  Medications:    Scheduled Medications:  amiodarone   200 mg Oral Daily   apixaban   2.5 mg Oral BID   Chlorhexidine  Gluconate Cloth  6 each Topical Daily   letrozole   2.5 mg Oral Daily   sodium chloride  flush  10-40 mL Intracatheter Q12H   sodium chloride  flush  3 mL Intravenous Q12H   sodium chloride  flush  3 mL Intravenous Q12H   Infusions:   PRN Medications: acetaminophen , bismuth  subsalicylate, diphenhydrAMINE , hydrocortisone  cream, ondansetron  (ZOFRAN ) IV, mouth rinse, sodium chloride  flush, sodium  chloride flush, sodium chloride  flush  Assessment/Plan   1. Acute systolic CHF: Echo in 12/23 with EF 50%, moderate-severe AS.  Developed atrial fibrillation in 6/24 but rate has been controlled.  I reviewed the echo done 11/24, this shows EF 20-25% with septal-lateral dyssynchrony consistent with LBBB, mild RV dysfunction, possible low flow/low gradient severe AS with mean gradient 34 mmHg and AVA 0.83 cm^2, mild-moderate mitral regurgitation, dilated IVC.  Cause of drop in EF is uncertain.  AS does not appear critical but think severe.  She is in atrial fibrillation but rate has not been uncontrolled. She has LBBB. No significant CAD on cath, nonischemic cardiomyopathy.  She was admitted with volume overload/decompensated HF. She is now s/p DCCV and in NSR.  Now bradycardic to 30-50s with softer pressures, consult to EP for possible CRT. CVP < 5, co-ox 54%.  She was initially on milrinone , now titrated off.  No BP room for additional meds.  - Will hold on diuresis with soft BP and bradycardia. Would send home with lasix  20 mg daily PRN.  - Digoxin  stopped with bradycardia per EP - Next step will be TAVR evaluation as outpatient 2. Atrial fibrillation: Persistent since at least 6/24.  She has been on Eliquis  but says she has missed doses occasionally.  HR 80s today.  She has a severely dilated LA, doubt she will hold NSR without anti-arrhythmic. TEE-guided DCCV on 12/31, she remains in NSR/SB today.  - Discussed with EP today about 200 mg amio with HR 30s-50s and random 1 second pauses. Recommend to continue amio as the dose has been lowered and pt is now off digoxin .  - Continue Eliquis  2.5 mg bid - Per EP she is not a candidate for CRT-D with frailty, LV dilation and body mass.  3. Aortic stenosis: I reviewed echo from 11/24.  Suspect low flow/low gradient severe AS with mean gradient 34 mmHg and AVA 0.83.  TEE confirmed low flow/low gradient severe AS.  - Workup for TAVR as outpatient.  4. Mitral  regurgitation: Moderate on TEE this admission. 5. PVCs: Occasionally on telemetry.  On amiodarone .  6. AKI: Mild SCr bump with hypotension 1.15>1.36>1.43  Addendum 05/18/23 9:54 AM SNF needs TB test prior to accepting patient. Will order TB test today, to be read in 48-72 hours with plan for SNF on Monday. Family aware.   Length of Stay: 7  Beckey LITTIE Coe, NP  05/18/2023, 8:35 AM  Advanced Heart Failure Team Pager [phone] (M-F; 7a - 5p)  Please contact [facility] for night-coverage after hours (5p -7a ) and weekends on [email or web address]  Patient seen with NP, agree with the above note.   CVP remains low, 2-3.  Co-ox 58.5%, creatinine up to 1.4.  Currently SB in 50s with stable BP, some bradycardia around 40 overnight.   No dyspnea.   General: NAD Neck: No JVD, no thyromegaly or thyroid nodule.  Lungs: Clear to auscultation bilaterally with normal respiratory effort. CV:  Nondisplaced PMI.  Heart regular S1/S2, no S3/S4, 3/6 SEM RUSB.  No peripheral edema.  Abdomen: Soft, nontender, no hepatosplenomegaly, no distention.  Skin: Intact without lesions or rashes.  Neurologic: Alert and oriented x 3.  Psych: Normal affect. Extremities: No clubbing or cyanosis.  HEENT: Normal.   HR currently stable in NSR, can continue amiodarone  200 mg daily and will accept mild nocturnal bradycardia.    Not volume overloaded, no diuretic for now.   Will need workup as outpatient for TAVR.    She can go to SNF when cleared by TB testing.   Ezra Shuck 05/18/2023 11:52 AM

## 2023-05-18 NOTE — Progress Notes (Signed)
 Physical Therapy Treatment Patient Details Name: Tara Hardin MRN: [id number] DOB: 03/31/1941 Today's Date: 05/18/2023   History of Present Illness 83 yo female admitted 12/27 from heart failure clinic with volume overload. 12/30 radial and femoral access R/LHC with mild nonobstructive coronary disease and pulmonary htn. 12/31 TEE with DCCV. PMH: Afib, breast CA, aortic stenosis, LBBB and systolic heart failure.    PT Comments  Pt pleasant, donning makeup on arrival and able to increase gait distance and activity tolerance this session. Pt continues to state that she feels fatigued with weak legs from hospitalization with education for walking program, STS and LB HEP. Pt denied OOB to chair and back to bed with lunch end of session. Encouraged increased mobility with HHPT recommended.   HR 51-63 BP 97/48 (63) sitting    If plan is discharge home, recommend the following: Assistance with cooking/housework   Can travel by private vehicle        Equipment Recommendations  None recommended by PT    Recommendations for Other Services       Precautions / Restrictions Precautions Precautions: Other (comment);Fall Precaution Comments: 12/30 Rt radial cath, watch HR     Mobility  Bed Mobility Overal bed mobility: Modified Independent Bed Mobility: Sit to Supine           General bed mobility comments: pt able to transition sit to supine without assist HOB 30 degrees per pt request    Transfers Overall transfer level: Needs assistance   Transfers: Sit to/from Stand Sit to Stand: Supervision           General transfer comment: pt able to perform 6 total STS from EOb with cues not to push with RUE, increased posture and power with repeated trials    Ambulation/Gait Ambulation/Gait assistance: Supervision Gait Distance (Feet): 550 Feet Assistive device: Rollator (4 wheels) Gait Pattern/deviations: Step-through pattern, Trunk flexed   Gait velocity interpretation: 1.31  - 2.62 ft/sec, indicative of limited community ambulator   General Gait Details: cues for posture and proximity to rollator as well as directional cues   Stairs             Wheelchair Mobility     Tilt Bed    Modified Rankin (Stroke Patients Only)       Balance Overall balance assessment: Mild deficits observed, not formally tested                                          Cognition Arousal: Alert Behavior During Therapy: WFL for tasks assessed/performed Overall Cognitive Status: Within Functional Limits for tasks assessed                                          Exercises General Exercises - Lower Extremity Long Arc Quad: AROM, Both, Seated, 20 reps Hip Flexion/Marching: AROM, Both, Seated, 10 reps    General Comments        Pertinent Vitals/Pain Pain Assessment Pain Assessment: No/denies pain    Home Living                          Prior Function            PT Goals (current goals can now be found in the care plan section)  Progress towards PT goals: Progressing toward goals    Frequency    Min 1X/week      PT Plan      Co-evaluation              AM-PAC PT 6 Clicks Mobility   Outcome Measure  Help needed turning from your back to your side while in a flat bed without using bedrails?: None Help needed moving from lying on your back to sitting on the side of a flat bed without using bedrails?: None Help needed moving to and from a bed to a chair (including a wheelchair)?: None Help needed standing up from a chair using your arms (e.g., wheelchair or bedside chair)?: A Little Help needed to walk in hospital room?: A Little Help needed climbing 3-5 steps with a railing? : A Little 6 Click Score: 21    End of Session   Activity Tolerance: Patient tolerated treatment well Patient left: in bed Nurse Communication: Mobility status PT Visit Diagnosis: Other abnormalities of gait and  mobility (R26.89)     Time: 1121-1140 PT Time Calculation (min) (ACUTE ONLY): 19 min  Charges:    $Gait Training: 8-22 mins PT General Charges $$ ACUTE PT VISIT: 1 Visit                     Lenoard SQUIBB, PT Acute Rehabilitation Services Office: [phone]    Lenoard KATHEE Docker 05/18/2023, 12:39 PM

## 2023-05-18 NOTE — Progress Notes (Signed)
 Daughter, Alecia Lemming, updated on need for TB test and not discharging until Monday.

## 2023-05-19 DIAGNOSIS — I5021 Acute systolic (congestive) heart failure: Secondary | ICD-10-CM | POA: Diagnosis not present

## 2023-05-19 LAB — CBC
HCT: 35.7 % — ABNORMAL LOW (ref 36.0–46.0)
Hemoglobin: 11.6 g/dL — ABNORMAL LOW (ref 12.0–15.0)
MCH: 30.5 pg (ref 26.0–34.0)
MCHC: 32.5 g/dL (ref 30.0–36.0)
MCV: 93.9 fL (ref 80.0–100.0)
Platelets: 180 10*3/uL (ref 150–400)
RBC: 3.8 MIL/uL — ABNORMAL LOW (ref 3.87–5.11)
RDW: 15.1 % (ref 11.5–15.5)
WBC: 6.9 10*3/uL (ref 4.0–10.5)
nRBC: 0 % (ref 0.0–0.2)

## 2023-05-19 LAB — BASIC METABOLIC PANEL
Anion gap: 6 (ref 5–15)
BUN: 29 mg/dL — ABNORMAL HIGH (ref 8–23)
CO2: 26 mmol/L (ref 22–32)
Calcium: 8.4 mg/dL — ABNORMAL LOW (ref 8.9–10.3)
Chloride: 107 mmol/L (ref 98–111)
Creatinine, Ser: 1.34 mg/dL — ABNORMAL HIGH (ref 0.44–1.00)
GFR, Estimated: 40 mL/min — ABNORMAL LOW (ref >60)
Glucose, Bld: 95 mg/dL (ref 70–99)
Potassium: 4.3 mmol/L (ref 3.5–5.1)
Sodium: 139 mmol/L (ref 135–145)

## 2023-05-19 NOTE — Progress Notes (Signed)
 Rounding Note    Patient Name: Tara Hardin Date of Encounter: 05/19/2023  [facility] Cardiologist: Lonni Cash, MD   Subjective   No complaints  Inpatient Medications    Scheduled Meds:  amiodarone   200 mg Oral Daily   apixaban   2.5 mg Oral BID   Chlorhexidine  Gluconate Cloth  6 each Topical Daily   letrozole   2.5 mg Oral Daily   sodium chloride  flush  10-40 mL Intracatheter Q12H   sodium chloride  flush  3 mL Intravenous Q12H   sodium chloride  flush  3 mL Intravenous Q12H   tuberculin  5 Units Intradermal Once   Continuous Infusions:  PRN Meds: acetaminophen , bismuth  subsalicylate, diphenhydrAMINE , hydrocortisone  cream, ondansetron  (ZOFRAN ) IV, mouth rinse, sodium chloride  flush, sodium chloride  flush, sodium chloride  flush   Vital Signs    Vitals:   05/18/23 1911 05/18/23 2256 05/19/23 0416 05/19/23 0718  BP: 113/78 118/61 (!) 105/52 (!) 109/58  Pulse: 69 (!) 57 75 (!) 41  Resp: 17 17 17    Temp: 98.9 F (37.2 C) 98.2 F (36.8 C) 97.8 F (36.6 C) 98 F (36.7 C)  TempSrc: Oral Oral Oral Oral  SpO2:      Weight:   47.7 kg   Height:        Intake/Output Summary (Last 24 hours) at 05/19/2023 0756 Last data filed at 05/19/2023 0646 Gross per 24 hour  Intake 240 ml  Output 1920 ml  Net -1680 ml      05/19/2023    4:16 AM 05/18/2023    5:32 AM 05/17/2023    2:53 AM  Last 3 Weights  Weight (lbs) 105 lb 3.2 oz 105 lb 6.1 oz 105 lb 2.6 oz  Weight (kg) 47.718 kg 47.8 kg 47.7 kg      Telemetry    SR and sinus brady - Personally Reviewed  ECG    N/a - Personally Reviewed  Physical Exam   GEN: No acute distress.   Neck: No JVD Cardiac: RRR, 3/6 systolic murmur rusb Respiratory: Clear to auscultation bilaterally. GI: Soft, nontender, non-distended  MS: No edema; No deformity. Neuro:  Nonfocal  Psych: Normal affect   Labs    High Sensitivity Troponin:  No results for input(s): TROPONINIHS in the last 720 hours.    Chemistry Recent Labs  Lab 05/17/23 0515 05/18/23 0420 05/19/23 0430  NA 137 139 139  K 4.5 4.3 4.3  CL 100 103 107  CO2 28 28 26   GLUCOSE 112* 101* 95  BUN 26* 25* 29*  CREATININE 1.36* 1.43* 1.34*  CALCIUM 8.4* 8.5* 8.4*  GFRNONAA 39* 37* 40*  ANIONGAP 9 8 6     Lipids No results for input(s): CHOL, TRIG, HDL, LABVLDL, LDLCALC, CHOLHDL in the last 168 hours.  Hematology Recent Labs  Lab 05/17/23 0515 05/18/23 0420 05/19/23 0430  WBC 5.4 6.3 6.9  RBC 3.42* 3.66* 3.80*  HGB 10.5* 11.2* 11.6*  HCT 32.2* 34.3* 35.7*  MCV 94.2 93.7 93.9  MCH 30.7 30.6 30.5  MCHC 32.6 32.7 32.5  RDW 14.9 15.0 15.1  PLT 155 175 180   Thyroid No results for input(s): TSH, FREET4 in the last 168 hours.  BNPNo results for input(s): BNP, PROBNP in the last 168 hours.  DDimer No results for input(s): DDIMER in the last 168 hours.   Radiology    No results found.  Cardiac Studies    Patient Profile      Assessment & Plan    1.Acute HFrEF - 03/2023 echo:  LVEF <20%, normal RV, severe MR, moderate aortic stenosis AVA VTI 0.83 mean grad 30, DI 0.32 - 12/224 RHC/LHC: nonobstructive CAD. Mean PA 38 PCWP 28, CI 3.13 on milrinone  0.25  - initially on milrinone , able to titrate off - medical therapy limited by bradycardia, soft bp's. Currently not on therapy.  - per EP not a candidate for CRT with frailty, LV dilation and body mass.   - CVP 4, last coox yesterday 58.5%.  - hold any additional diuretics -   2.Afib/bradycardia - s/p DCCV - she is on amiodarone  oral 200mg  daily - eliquis  for stroke prevention - tele shows SR and sinus brady  3. Aortic stenosis - 03/2023 echo: LVEF <20%, normal RV, severe MR, moderate aortic stenosis AVA VTI 0.83 mean grad 30, DI 0.32 - lower than expected gradient in setting of severe LV dysfunction. Essentially low flow low graident severe range AS - ouatpatient TAVR evaluation is planned  4. Mitral regurgitation - moderate  by TEE  From notes plans for SNF placement Monday   For questions or updates, please contact [facility] Please consult [email or web address] for contact info under        Signed, Alvan Carrier, MD  05/19/2023, 7:56 AM

## 2023-05-19 NOTE — Progress Notes (Signed)
 Mobility Specialist Progress Note:  P  05/19/23 1000  Mobility  Activity Ambulated with assistance in hallway  Level of Assistance Standby assist, set-up cues, supervision of patient - no hands on  Assistive Device Four wheel walker  Distance Ambulated (ft) 320 ft  Activity Response Tolerated well  Mobility Referral Yes  Mobility visit 1 Mobility  Mobility Specialist Start Time (ACUTE ONLY) 0912  Mobility Specialist Stop Time (ACUTE ONLY) [id number]  Mobility Specialist Time Calculation (min) (ACUTE ONLY) 12 min   Received pt in bed having no complaints and agreeable to mobility. Pt was asymptomatic throughout ambulation and returned to room w/o fault. Left in bed w/ call bell in reach and all needs met.   D'Vante Nicholaus Mobility Specialist Please contact via Special Educational Needs Teacher or Rehab office at [phone]

## 2023-05-19 NOTE — Plan of Care (Signed)
   Problem: Education: Goal: Knowledge of General Education information will improve Description Including pain rating scale, medication(s)/side effects and non-pharmacologic comfort measures Outcome: Progressing   Problem: Health Behavior/Discharge Planning: Goal: Ability to manage health-related needs will improve Outcome: Progressing

## 2023-05-20 DIAGNOSIS — I5021 Acute systolic (congestive) heart failure: Secondary | ICD-10-CM | POA: Diagnosis not present

## 2023-05-20 LAB — CBC
HCT: 34.7 % — ABNORMAL LOW (ref 36.0–46.0)
Hemoglobin: 11.2 g/dL — ABNORMAL LOW (ref 12.0–15.0)
MCH: 30.3 pg (ref 26.0–34.0)
MCHC: 32.3 g/dL (ref 30.0–36.0)
MCV: 93.8 fL (ref 80.0–100.0)
Platelets: 190 10*3/uL (ref 150–400)
RBC: 3.7 MIL/uL — ABNORMAL LOW (ref 3.87–5.11)
RDW: 15.1 % (ref 11.5–15.5)
WBC: 6.7 10*3/uL (ref 4.0–10.5)
nRBC: 0 % (ref 0.0–0.2)

## 2023-05-20 LAB — BASIC METABOLIC PANEL
Anion gap: 7 (ref 5–15)
BUN: 29 mg/dL — ABNORMAL HIGH (ref 8–23)
CO2: 25 mmol/L (ref 22–32)
Calcium: 8.3 mg/dL — ABNORMAL LOW (ref 8.9–10.3)
Chloride: 107 mmol/L (ref 98–111)
Creatinine, Ser: 1.3 mg/dL — ABNORMAL HIGH (ref 0.44–1.00)
GFR, Estimated: 41 mL/min — ABNORMAL LOW (ref >60)
Glucose, Bld: 92 mg/dL (ref 70–99)
Potassium: 4.2 mmol/L (ref 3.5–5.1)
Sodium: 139 mmol/L (ref 135–145)

## 2023-05-20 NOTE — Progress Notes (Signed)
 RN assessed skin on and around tb test site and patient has no induration.

## 2023-05-20 NOTE — Progress Notes (Signed)
 Rounding Note    Patient Name: Tara Hardin Date of Encounter: 05/20/2023  [facility] Cardiologist: Lonni Cash, MD   Subjective   No complaints  Inpatient Medications    Scheduled Meds:  amiodarone   200 mg Oral Daily   apixaban   2.5 mg Oral BID   Chlorhexidine  Gluconate Cloth  6 each Topical Daily   letrozole   2.5 mg Oral Daily   sodium chloride  flush  10-40 mL Intracatheter Q12H   sodium chloride  flush  3 mL Intravenous Q12H   sodium chloride  flush  3 mL Intravenous Q12H   tuberculin  5 Units Intradermal Once   Continuous Infusions:  PRN Meds: acetaminophen , bismuth  subsalicylate, diphenhydrAMINE , hydrocortisone  cream, ondansetron  (ZOFRAN ) IV, mouth rinse, sodium chloride  flush, sodium chloride  flush, sodium chloride  flush   Vital Signs    Vitals:   05/19/23 1537 05/19/23 1928 05/19/23 2317 05/20/23 0407  BP: 106/72 97/63 (!) 114/47 (!) 108/48  Pulse: 75 63  (!) 43  Resp:  16    Temp: 98.6 F (37 C) (!) 97.4 F (36.3 C) 97.7 F (36.5 C) (!) 97.5 F (36.4 C)  TempSrc: Oral Oral Oral Oral  SpO2:      Weight:    47.4 kg  Height:        Intake/Output Summary (Last 24 hours) at 05/20/2023 0710 Last data filed at 05/19/2023 1910 Gross per 24 hour  Intake 360 ml  Output 400 ml  Net -40 ml      05/20/2023    4:07 AM 05/19/2023    4:16 AM 05/18/2023    5:32 AM  Last 3 Weights  Weight (lbs) 104 lb 8 oz 105 lb 3.2 oz 105 lb 6.1 oz  Weight (kg) 47.401 kg 47.718 kg 47.8 kg      Telemetry    SR and sinus brady - Personally Reviewed  ECG    N/a - Personally Reviewed  Physical Exam   GEN: No acute distress.   Neck: No JVD Cardiac: RRR, 3/6 systolic murmur rusb and apex Respiratory: Clear to auscultation bilaterally. GI: Soft, nontender, non-distended  MS: No edema; No deformity. Neuro:  Nonfocal  Psych: Normal affect   Labs    High Sensitivity Troponin:  No results for input(s): TROPONINIHS in the last 720 hours.    Chemistry Recent Labs  Lab 05/18/23 0420 05/19/23 0430 05/20/23 0416  NA 139 139 139  K 4.3 4.3 4.2  CL 103 107 107  CO2 28 26 25   GLUCOSE 101* 95 92  BUN 25* 29* 29*  CREATININE 1.43* 1.34* 1.30*  CALCIUM 8.5* 8.4* 8.3*  GFRNONAA 37* 40* 41*  ANIONGAP 8 6 7     Lipids No results for input(s): CHOL, TRIG, HDL, LABVLDL, LDLCALC, CHOLHDL in the last 168 hours.  Hematology Recent Labs  Lab 05/18/23 0420 05/19/23 0430 05/20/23 0416  WBC 6.3 6.9 6.7  RBC 3.66* 3.80* 3.70*  HGB 11.2* 11.6* 11.2*  HCT 34.3* 35.7* 34.7*  MCV 93.7 93.9 93.8  MCH 30.6 30.5 30.3  MCHC 32.7 32.5 32.3  RDW 15.0 15.1 15.1  PLT 175 180 190   Thyroid No results for input(s): TSH, FREET4 in the last 168 hours.  BNPNo results for input(s): BNP, PROBNP in the last 168 hours.  DDimer No results for input(s): DDIMER in the last 168 hours.   Radiology    No results found.  Cardiac Studies     Patient Profile    Assessment & Plan    1.Acute HFrEF -  03/2023 echo: LVEF <20%, normal RV, severe MR, moderate aortic stenosis AVA VTI 0.83 mean grad 30, DI 0.32 - 12/24 RHC/LHC: nonobstructive CAD. Mean PA 38 PCWP 28, CI 3.13 on milrinone  0.25   - initially on milrinone , able to titrate off - medical therapy limited by bradycardia, soft bp's. Currently not on therapy.  - per EP not a candidate for CRT with frailty, LV dilation and body mass.    - CVP 3-4, last coox yesterday 58.5%.  - no additional diuresis needed   2.Afib/bradycardia - s/p DCCV - she is on amiodarone  oral 200mg  daily - eliquis  for stroke prevention - tele shows SR and sinus brady   3. Aortic stenosis - 03/2023 echo: LVEF <20%, normal RV, severe MR, moderate aortic stenosis AVA VTI 0.83 mean grad 30, DI 0.32 - lower than expected gradient in setting of severe LV dysfunction. Essentially low flow low graident severe range AS - ouatpatient TAVR evaluation is planned   4. Mitral regurgitation - moderate  by TEE   From notes plans for SNF placement Monday  For questions or updates, please contact [facility] Please consult [email or web address] for contact info under        Signed, Alvan Carrier, MD  05/20/2023, 7:10 AM

## 2023-05-20 NOTE — Plan of Care (Signed)
   Problem: Clinical Measurements: Goal: Ability to maintain clinical measurements within normal limits will improve Outcome: Progressing Goal: Will remain free from infection Outcome: Progressing

## 2023-05-21 ENCOUNTER — Other Ambulatory Visit (HOSPITAL_COMMUNITY): Payer: Self-pay

## 2023-05-21 DIAGNOSIS — I5043 Acute on chronic combined systolic (congestive) and diastolic (congestive) heart failure: Secondary | ICD-10-CM | POA: Diagnosis not present

## 2023-05-21 LAB — BASIC METABOLIC PANEL
Anion gap: 9 (ref 5–15)
BUN: 30 mg/dL — ABNORMAL HIGH (ref 8–23)
CO2: 24 mmol/L (ref 22–32)
Calcium: 8.2 mg/dL — ABNORMAL LOW (ref 8.9–10.3)
Chloride: 104 mmol/L (ref 98–111)
Creatinine, Ser: 1.49 mg/dL — ABNORMAL HIGH (ref 0.44–1.00)
GFR, Estimated: 35 mL/min — ABNORMAL LOW (ref >60)
Glucose, Bld: 100 mg/dL — ABNORMAL HIGH (ref 70–99)
Potassium: 4.1 mmol/L (ref 3.5–5.1)
Sodium: 137 mmol/L (ref 135–145)

## 2023-05-21 LAB — CBC
HCT: 35.8 % — ABNORMAL LOW (ref 36.0–46.0)
Hemoglobin: 11.6 g/dL — ABNORMAL LOW (ref 12.0–15.0)
MCH: 30.5 pg (ref 26.0–34.0)
MCHC: 32.4 g/dL (ref 30.0–36.0)
MCV: 94.2 fL (ref 80.0–100.0)
Platelets: 216 10*3/uL (ref 150–400)
RBC: 3.8 MIL/uL — ABNORMAL LOW (ref 3.87–5.11)
RDW: 15.2 % (ref 11.5–15.5)
WBC: 7.2 10*3/uL (ref 4.0–10.5)
nRBC: 0 % (ref 0.0–0.2)

## 2023-05-21 MED ORDER — AMIODARONE HCL 200 MG PO TABS: 200.0000 mg | ORAL_TABLET | Freq: Every day | ORAL | Status: DC

## 2023-05-21 MED ORDER — FUROSEMIDE 20 MG PO TABS: ORAL_TABLET | ORAL | Status: DC

## 2023-05-21 MED ORDER — POTASSIUM CHLORIDE CRYS ER 20 MEQ PO TBCR: EXTENDED_RELEASE_TABLET | ORAL | Status: DC

## 2023-05-21 NOTE — Progress Notes (Signed)
 Arrived to room for PICC removal. Pt up eating lunch. Discussed with Elliot Gurney, RN: requested I allow pt to eat first. Will return for removal.

## 2023-05-21 NOTE — Plan of Care (Signed)
  Problem: Education: Goal: Knowledge of General Education information will improve Description: Including pain rating scale, medication(s)/side effects and non-pharmacologic comfort measures Outcome: Progressing   Problem: Health Behavior/Discharge Planning: Goal: Ability to manage health-related needs will improve Outcome: Progressing   Problem: Clinical Measurements: Goal: Ability to maintain clinical measurements within normal limits will improve Outcome: Progressing Goal: Will remain free from infection Outcome: Progressing Goal: Diagnostic test results will improve Outcome: Progressing Goal: Respiratory complications will improve Outcome: Progressing Goal: Cardiovascular complication will be avoided Outcome: Progressing   Problem: Activity: Goal: Risk for activity intolerance will decrease Outcome: Progressing   Problem: Nutrition: Goal: Adequate nutrition will be maintained Outcome: Progressing   Problem: Coping: Goal: Level of anxiety will decrease Outcome: Progressing   Problem: Elimination: Goal: Will not experience complications related to bowel motility Outcome: Progressing Goal: Will not experience complications related to urinary retention Outcome: Progressing   Problem: Pain Management: Goal: General experience of comfort will improve Outcome: Progressing   Problem: Safety: Goal: Ability to remain free from injury will improve Outcome: Progressing   Problem: Skin Integrity: Goal: Risk for impaired skin integrity will decrease Outcome: Progressing   Problem: Education: Goal: Ability to demonstrate management of disease process will improve Outcome: Progressing Goal: Ability to verbalize understanding of medication therapies will improve Outcome: Progressing Goal: Individualized Educational Video(s) Outcome: Progressing   Problem: Activity: Goal: Capacity to carry out activities will improve Outcome: Progressing   Problem: Cardiac: Goal:  Ability to achieve and maintain adequate cardiopulmonary perfusion will improve Outcome: Progressing   Problem: Education: Goal: Understanding of CV disease, CV risk reduction, and recovery process will improve Outcome: Progressing Goal: Individualized Educational Video(s) Outcome: Progressing   Problem: Activity: Goal: Ability to return to baseline activity level will improve Outcome: Progressing   Problem: Cardiovascular: Goal: Ability to achieve and maintain adequate cardiovascular perfusion will improve Outcome: Progressing Goal: Vascular access site(s) Level 0-1 will be maintained Outcome: Progressing   Problem: Health Behavior/Discharge Planning: Goal: Ability to safely manage health-related needs after discharge will improve Outcome: Progressing   Problem: Education: Goal: Understanding of CV disease, CV risk reduction, and recovery process will improve Outcome: Progressing   Problem: Activity: Goal: Ability to return to baseline activity level will improve Outcome: Progressing   Problem: Cardiovascular: Goal: Ability to achieve and maintain adequate cardiovascular perfusion will improve Outcome: Progressing Goal: Vascular access site(s) Level 0-1 will be maintained Outcome: Progressing   Problem: Health Behavior/Discharge Planning: Goal: Ability to safely manage health-related needs after discharge will improve Outcome: Progressing

## 2023-05-21 NOTE — TOC Progression Note (Signed)
 Transition of Care [facility]) - Progression Note    Patient Details  Name: Tara Hardin MRN: [id number] Date of Birth: [birth year]  Transition [facility] CM/SW Contact  [name] Phone Number: [phone] 05/21/2023, 12:14 PM  Clinical Narrative:   CSW faxed completed documents for [facility] to [place]. Pt will be transported by [facility]. CSW placed dc documents on the chart.   TOC will continue following.        Expected Discharge Plan: Assisted Living Barriers to Discharge: No Barriers Identified  Expected Discharge Plan and Services   Discharge Planning Services: CM Consult   Living arrangements for the past 2 months: Single Family Home Expected Discharge Date: 05/21/23                                     Social Determinants of Health (SDOH) Interventions SDOH Screenings   Food Insecurity: No Food Insecurity (05/11/2023)  Housing: Low Risk  (05/11/2023)  Transportation Needs: No Transportation Needs (05/11/2023)  Utilities: Not At Risk (05/11/2023)  Social Connections: Moderately Isolated (05/14/2023)  Tobacco Use: Low Risk  (05/15/2023)    Readmission Risk Interventions     No data to display

## 2023-05-21 NOTE — Progress Notes (Signed)
 Occupational Therapy Treatment Patient Details Name: Tara Hardin MRN: [id number] DOB: [birth year] Today's Date: 05/21/2023   History of present illness 83 yo female admitted 12/27 from heart failure clinic with volume overload. 12/30 radial and femoral access R/LHC with mild nonobstructive coronary disease and pulmonary htn. 12/31 TEE with DCCV. PMH: Afib, breast CA, aortic stenosis, LBBB and systolic heart failure.   OT comments  Pt making excellent progress towards goals with planned DC/transition to ALF today. Pt politely declined bathing/dressing tasks in prep for DC and opted for endurance retraining with mobility around unit using (husband's) Rollator. No physical assistance needed to complete tasks and no safety concerns noted. Reinforced AROM LE HEP sitting EOB per pt request at end of session.      If plan is discharge home, recommend the following:  Assist for transportation   Equipment Recommendations  None recommended by OT    Recommendations for Other Services      Precautions / Restrictions Precautions Precautions: Fall Precaution Comments: monitor HR Restrictions Weight Bearing Restrictions Per Provider Order: No       Mobility Bed Mobility Overal bed mobility: Modified Independent                  Transfers Overall transfer level: Modified independent Equipment used: Rollator (4 wheels) Transfers: Sit to/from Stand                   Balance Overall balance assessment: No apparent balance deficits (not formally assessed) (with use of Rollator)                                         ADL either performed or assessed with clinical judgement   ADL Overall ADL's : Modified independent                 Upper Body Dressing : Modified independent;Sitting Upper Body Dressing Details (indicate cue type and reason): gown around back Lower Body Dressing: Modified independent;Sit to/from stand;Sitting/lateral leans Lower Body  Dressing Details (indicate cue type and reason): donning socks EOB.             Functional mobility during ADLs: Modified independent;Rollator (4 wheels) General ADL Comments: very near baseline. pt declined to complete bathing and dressing tasks in prep for DC and opted to do that later. Pt requesting mobility and able to walk entire unit though did need cues for Rollator brake mgmt. educated re: LE AROM exercises per pt request at end of session    Extremity/Trunk Assessment Upper Extremity Assessment Upper Extremity Assessment: Overall WFL for tasks assessed;Right hand dominant   Lower Extremity Assessment Lower Extremity Assessment: Defer to PT evaluation        Vision   Vision Assessment?: No apparent visual deficits   Perception     Praxis      Cognition Arousal: Alert Behavior During Therapy: WFL for tasks assessed/performed Overall Cognitive Status: Within Functional Limits for tasks assessed                                          Exercises Exercises: Other exercises Other Exercises Other Exercises: AROM: ankle pumps, seated marches, long arc quad, toe raises and glut/hamstring contraction (difficulty with this one)    Shoulder Instructions       General  Comments      Pertinent Vitals/ Pain       Pain Assessment Pain Assessment: No/denies pain  Home Living                                          Prior Functioning/Environment              Frequency  Min 1X/week        Progress Toward Goals  OT Goals(current goals can now be found in the care plan section)  Progress towards OT goals: Progressing toward goals  Acute Rehab OT Goals Patient Stated Goal: make sure my husband is ok OT Goal Formulation: With patient Time For Goal Achievement: 05/29/23 Potential to Achieve Goals: Good ADL Goals Pt/caregiver will Perform Home Exercise Program: Increased strength;Both right and left upper extremity;With  written HEP provided;Independently;With theraband Additional ADL Goal #1: Pt will be able to tolerate >10 mins of OOB activities to maximize activity tolerance and return to PLOF using AD as needed for support.  Plan      Co-evaluation                 AM-PAC OT 6 Clicks Daily Activity     Outcome Measure   Help from another person eating meals?: None Help from another person taking care of personal grooming?: None Help from another person toileting, which includes using toliet, bedpan, or urinal?: None Help from another person bathing (including washing, rinsing, drying)?: None Help from another person to put on and taking off regular upper body clothing?: None Help from another person to put on and taking off regular lower body clothing?: None 6 Click Score: 24    End of Session Equipment Utilized During Treatment: Rollator (4 wheels)  OT Visit Diagnosis: Other abnormalities of gait and mobility (R26.89);Muscle weakness (generalized) (M62.81);Pain   Activity Tolerance Patient tolerated treatment well   Patient Left in bed;with call bell/phone within reach   Nurse Communication Mobility status        Time: 8897-8874 OT Time Calculation (min): 23 min  Charges: OT General Charges $OT Visit: 1 Visit OT Treatments $Therapeutic Activity: 8-22 mins  Mliss NOVAK, OTR/L Acute Rehab Services Office: [phone]   Mliss Getting 05/21/2023, 11:31 AM

## 2023-05-21 NOTE — Progress Notes (Signed)
 Patient ID: Tara Hardin, female   DOB: 01-Feb-1941, 83 y.o.   MRN: [id number]     Advanced Heart Failure Rounding Note  Cardiologist: Lonni Cash, MD   Chief Complaint: Acute systolic CHF  Subjective:    No dyspnea, orthopnea or PND. Ambulated in hall with a walker yesterday.   CVP not connected.   Objective:   L/RHC 12/30: mild cor disease (30% Cx stenosis), RA 14, PA 80/20 (38) PCWP 28, v-waves to 40, CO/CI (fick) 4.67/3.13, PAPi 2.1 TEE-DCCV on 12/31: TEE showed EF 20-25% range, moderate RV dysfunction, low flow/low gradient severe AS, moderate MR.  IVC was not dilated.  She converted to NSR with difficulty (3 shocks).   Weight Range: 47.3 kg Body mass index is 17.9 kg/m.   Vital Signs:   Temp:  [97.5 F (36.4 C)-98.6 F (37 C)] 97.6 F (36.4 C) (01/06 0305) Pulse Rate:  [47-69] 69 (01/05 1109) Resp:  [16-20] 16 (01/06 0305) BP: (100-113)/(54-68) 108/54 (01/06 0305) SpO2:  [94 %-99 %] 96 % (01/06 0305) Weight:  [47.3 kg] 47.3 kg (01/06 0601) Last BM Date : 05/19/23  Weight change: Filed Weights   05/19/23 0416 05/20/23 0407 05/21/23 0601  Weight: 47.7 kg 47.4 kg 47.3 kg   Intake/Output:   Intake/Output Summary (Last 24 hours) at 05/21/2023 0655 Last data filed at 05/21/2023 0651 Gross per 24 hour  Intake 557 ml  Output 300 ml  Net 257 ml    Physical Exam  General:  Thin, elderly female HEENT: normal Neck: supple. no JVD.  Cor: PMI nondisplaced. Regular rate & rhythm. No rubs, gallops, 3/6 AS murmur Lungs: clear Abdomen: soft, nontender, nondistended.  Extremities: no cyanosis, clubbing, rash, edema, RUE PICC Neuro: alert & orientedx3. Affect pleasant   Telemetry   Sinus brady 50s, PVCs  Labs    CBC Recent Labs    05/20/23 0416 05/21/23 0500  WBC 6.7 7.2  HGB 11.2* 11.6*  HCT 34.7* 35.8*  MCV 93.8 94.2  PLT 190 216   Basic Metabolic Panel Recent Labs    98/94/74 0416 05/21/23 0500  NA 139 137  K 4.2 4.1  CL 107 104  CO2 25  24  GLUCOSE 92 100*  BUN 29* 30*  CREATININE 1.30* 1.49*  CALCIUM 8.3* 8.2*   Liver Function Tests No results for input(s): AST, ALT, ALKPHOS, BILITOT, PROT, ALBUMIN in the last 72 hours. No results for input(s): LIPASE, AMYLASE in the last 72 hours. Cardiac Enzymes No results for input(s): CKTOTAL, CKMB, CKMBINDEX, TROPONINI in the last 72 hours.  BNP: BNP (last 3 results) Recent Labs    05/10/23 1701  BNP 4,471.5*   ProBNP (last 3 results) No results for input(s): PROBNP in the last 8760 hours.  D-Dimer No results for input(s): DDIMER in the last 72 hours. Hemoglobin A1C No results for input(s): HGBA1C in the last 72 hours.  Fasting Lipid Panel No results for input(s): CHOL, HDL, LDLCALC, TRIG, CHOLHDL, LDLDIRECT in the last 72 hours. Thyroid Function Tests No results for input(s): TSH, T4TOTAL, T3FREE, THYROIDAB in the last 72 hours.  Invalid input(s): FREET3  Other results:  Imaging   No results found.  Medications:    Scheduled Medications:  amiodarone   200 mg Oral Daily   apixaban   2.5 mg Oral BID   Chlorhexidine  Gluconate Cloth  6 each Topical Daily   letrozole   2.5 mg Oral Daily   sodium chloride  flush  10-40 mL Intracatheter Q12H   Infusions:   PRN Medications: acetaminophen , bismuth   subsalicylate, diphenhydrAMINE , hydrocortisone  cream, ondansetron  (ZOFRAN ) IV, mouth rinse, sodium chloride  flush  Assessment/Plan   1. Acute systolic CHF: Echo in 12/23 with EF 50%, moderate-severe AS.  Developed atrial fibrillation in 6/24 but rate has been controlled.  Echo 11/24 shows EF 20-25% with septal-lateral dyssynchrony consistent with LBBB, mild RV dysfunction, possible low flow/low gradient severe AS with mean gradient 34 mmHg and AVA 0.83 cm^2, mild-moderate mitral regurgitation, dilated IVC.  Cause of drop in EF is uncertain.  AS does not appear critical but think severe.  She is in atrial fibrillation but  rate has not been uncontrolled. She has LBBB. No significant CAD on cath, nonischemic cardiomyopathy.  She was admitted with volume overload/decompensated HF. She is now s/p DCCV and in NSR.  Now bradycardic to 30-50s with softer pressures, consult to EP for possible CRT. CVP < 5, co-ox 54%.  She was initially on milrinone , now titrated off.   - Holding off on GDMT d/t soft BP and frailty. - Will send to SNF with lasix  20 mg daily PRN.  - Digoxin  stopped with bradycardia per EP - Next step will be TAVR evaluation as outpatient 2. Atrial fibrillation: Persistent since at least 6/24.  She has been on Eliquis  but says she has missed doses occasionally.  HR 80s today.  She has a severely dilated LA, doubt she will hold NSR without anti-arrhythmic. TEE-guided DCCV on 12/31, she remains in NSR/SB today.  - Discussed amiodarone  dosing with EP this admit d/t bradycardia. Recommend to continue amio 200 mg daily as the dose has been lowered and pt is now off digoxin .  - Continue Eliquis  2.5 mg bid - Per EP she is not a candidate for CRT-D with frailty, LV dilation and body mass.  3. Aortic stenosis: I reviewed echo from 11/24.  Suspect low flow/low gradient severe AS with mean gradient 34 mmHg and AVA 0.83.  TEE confirmed low flow/low gradient severe AS.  - Workup for TAVR as outpatient.  4. Mitral regurgitation: Moderate on TEE this admission. 5. PVCs: Occasionally on telemetry.  On amiodarone .  6. CKD IIIb: Scr has been between 1.2-1.4 this admit, up slightly today 1.3>1.5. - No diuretic for now.   Stable from HF standpoint for ALF. Will reach out to  CSW to see if she has a bed.  Length of Stay: [address] PA-C  05/21/2023, 6:55 AM  Advanced Heart Failure Team Pager [phone] (M-F; 7a - 5p)  Please contact [facility] for night-coverage after hours (5p -7a ) and weekends on [email or web address]

## 2023-05-21 NOTE — Progress Notes (Signed)
 RUE PICC removed. Bruising noted medial to PICC site. Otherwise unremarkable. Vaseline gauze and gauze pressure dressing applied. Pt instructed to remain flat in bed for 30 min post removal. Keep gauze dressing dry and intact for 24 hours. Report any redness/swelling/drainage at site. She voiced understanding.

## 2023-05-21 NOTE — Progress Notes (Signed)
 TB skin test given in left forearm. Read results in 48 hours.

## 2023-05-21 NOTE — TOC Transition Note (Signed)
 Transition of Care [facility]) - Discharge Note   Patient Details  Name: Tara Hardin MRN: [id number] Date of Birth: [birth year]  Transition of Care [facility]) CM/SW Contact:  [name] Phone Number: [phone] 05/21/2023, 12:31 PM   Clinical Narrative:   CSW called and confirmed all appropriate documents were received. . Pt is going to [facility], [address]. The number to call for report is [phone]. Pt is being transported by [facility].     Final next level of care: Assisted Living Barriers to Discharge: No Barriers Identified   Patient Goals and CMS Choice Patient states their goals for this hospitalization and ongoing recovery are:: wants her to get better and remain independent CMS Medicare.gov Compare Post Acute Care list provided to:: Patient Represenative (must comment) Choice offered to / list presented to : Adult Children      Discharge Placement                       Discharge Plan and Services Additional resources added to the After Visit Summary for     Discharge Planning Services: CM Consult                                 Social Drivers of Health (SDOH) Interventions SDOH Screenings   Food Insecurity: No Food Insecurity (05/11/2023)  Housing: Low Risk  (05/11/2023)  Transportation Needs: No Transportation Needs (05/11/2023)  Utilities: Not At Risk (05/11/2023)  Social Connections: Moderately Isolated (05/14/2023)  Tobacco Use: Low Risk  (05/15/2023)     Readmission Risk Interventions     No data to display

## 2023-05-21 NOTE — TOC Initial Note (Addendum)
 Transition of Care [facility]) - Initial/Assessment Note    Patient Details  Name: Tara Hardin MRN: [id number] Date of Birth: 09-18-1940  Transition of [facility]) CM/SW Contact:    Justina Delcia Czar, RN Phone Number: [phone] 05/21/2023, 11:20 AM  Clinical Narrative:                 TB skin test complete and negative. Attending completed paperwork, placed on chart for HF TOC CSW, will contact and submit paperwork to [facility] to review.  Pt will dc to [facility] via ambulance.  Updated patient.  Pt dc and off monitor. Took patient to visit with husband on 3E02.  Expected Discharge Plan: Assisted Living Barriers to Discharge: No Barriers Identified   Patient Goals and CMS Choice Patient states their goals for this hospitalization and ongoing recovery are:: wants her to get better and remain independent CMS Medicare.gov Compare Post Acute Care list provided to:: Patient Represenative (must comment) Choice offered to / list presented to : Adult Children      Expected Discharge Plan and Services   Discharge Planning Services: CM Consult   Living arrangements for the past 2 months: Single Family Home Expected Discharge Date: 05/21/23                                    Prior Living Arrangements/Services Living arrangements for the past 2 months: Single Family Home Lives with:: Spouse Patient language and need for interpreter reviewed:: Yes Do you feel safe going back to the place where you live?: Yes      Need for Family Participation in Patient Care: Yes (Comment) Care giver support system in place?: Yes (comment)   Criminal Activity/Legal Involvement Pertinent to Current Situation/Hospitalization: No - Comment as needed  Activities of Daily Living   ADL Screening (condition at time of admission) Independently performs ADLs?: Yes (appropriate for developmental age) Is the patient deaf or have difficulty hearing?: No Does the patient have difficulty  seeing, even when wearing glasses/contacts?: No Does the patient have difficulty concentrating, remembering, or making decisions?: No  Permission Sought/Granted Permission sought to share information with : Case Manager, Family Supports, PCP Permission granted to share information with : Yes, Verbal Permission Granted  Share Information with NAME: Gerren Kittler  Permission granted to share info w AGENCY: Assisted Living Facility  Permission granted to share info w Relationship: daughter in law, son  Permission granted to share info w Contact Information: [phone]  Emotional Assessment Appearance:: Appears stated age Attitude/Demeanor/Rapport: Engaged Affect (typically observed): Appropriate Orientation: : Oriented to Self, Oriented to Place, Oriented to  Time, Oriented to Situation   Psych Involvement: No (comment)  Admission diagnosis:  Acute on chronic combined systolic and diastolic CHF, NYHA class 3 (HCC) [I50.43] Patient Active Problem List   Diagnosis Date Noted   Acute on chronic combined systolic and diastolic CHF, NYHA class 3 (HCC) 05/11/2023   Family history of breast cancer 07/21/2021   Malignant neoplasm of upper-inner quadrant of left breast in female, estrogen receptor positive (HCC) 07/18/2021   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 [place], [place] - [address] [place] [address] Phone: [phone] Fax: [phone]     Social Drivers of Health (SDOH) Social History: SDOH Screenings   Food Insecurity: No Food Insecurity (05/11/2023)  Housing: Low Risk   (05/11/2023)  Transportation Needs: No Transportation Needs (05/11/2023)  Utilities: Not At Risk (05/11/2023)  Social Connections: Moderately Isolated (05/14/2023)  Tobacco Use: Low Risk  (05/15/2023)   SDOH Interventions:     Readmission Risk Interventions     No data to display

## 2023-05-28 ENCOUNTER — Encounter (HOSPITAL_COMMUNITY): Payer: Federal, State, Local not specified - PPO

## 2023-05-31 ENCOUNTER — Ambulatory Visit (HOSPITAL_COMMUNITY): Payer: Federal, State, Local not specified - PPO | Admitting: Internal Medicine

## 2023-06-01 NOTE — Progress Notes (Incomplete)
ADVANCED HF CLINIC CONSULT NOTE  Referring Physician: Merri Brunette, MD Primary Care: Merri Brunette, MD Primary Cardiologist: Verne Carrow, MD  Chief Complaint: Heart failure  HPI:  Ms. Stankowski is an 83 yo female with history of  persistent AF, breast cancer, aortic stenosis. LBBB and recently diagnosed systolic HF. Referred by Dr. Clifton James for further evaluation of her HF.   Echo 12/23 EF 50%. Mild MR. Mild to moderate TR. Moderate to severe AS with mean gradient 28 mmHg, AVA 0.94 cm2, SVI 47, DI 0.25.  Plan was to follow her moderate AS with repeat echo in one year.   Developed AF in 6/24 and was started on Eliquis. In October 2024 developed HF symptoms. Echo 04/05/23 EF < 20%. Severe enlargement of the left atrium. Mild to moderate MR. Moderately severe aortic stenosis with mean gradient 29 mmHg, AVA 0.82-0.88cm2, SVI 42, DI 0.32.   She saw Dr. Clifton James on 04/17/23 and volume status looked good on lasix 20 daily but BP too low to start GDMT. She was referred here for further HF evalaution /  Today she presents to the AHF Clinic to establish care. She is here with her son and daughter in law (they live in [place]). She is very active around the house and takes care of her bedridden husband (he had MVA years ago and has been full care). Denies CP and SOB. Reports fatigue throughout the day but she tries to stay busy. She has had BLE edema for a couple of weeks now that would previously resolve with her lasix but over the last 2 weeks she has noticed progressive worsening of edema up to her hips despite doubling her Lasix. Denies dizziness. Weighs at home 111-117 lbs. Watches what she's eats, tries to limit salt intake. Drinks ~48oz fluid/day.    Family history: Brother passed away, had a fib. Sister has an ICD. Sister passed away from CAD.    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     apixaban (ELIQUIS) 2.5 MG TABS tablet Take 1 tablet (2.5 mg total) by mouth 2 (two) times daily. 60 tablet 5   Biotin 1 MG CAPS Take 1 mg by mouth daily.     furosemide (LASIX) 20 MG tablet Take 1 tablet as needed for weight gain more than 3 lb in a day or 5 lb in a week or worsening leg edema, shortness of breath     letrozole (FEMARA) 2.5 MG tablet Take 1 tablet (2.5 mg total) by mouth daily. 90 tablet 3   Multiple Vitamin (MULTIVITAMIN) tablet Take 1 tablet by mouth daily.     Omega-3 Fatty Acids (FISH OIL) 1000 MG CAPS Take 1,000 mg by mouth daily.     potassium chloride SA (KLOR-CON M) 20 MEQ tablet Take 1 tablet as needed when taking lasix     No current facility-administered medications for this  visit.    No Known Allergies    Social History   Socioeconomic History   Marital status: Married    Spouse name: Not on file   Number of children: 3   Years of education: Not on file   Highest education level: Not on file  Occupational History   Occupation: Retired Information systems manager  Tobacco Use   Smoking status: Never   Smokeless tobacco: Never  Vaping Use   Vaping status: Never Used  Substance and Sexual Activity   Alcohol use: Not Currently   Drug use: Never   Sexual activity: Not Currently    Birth control/protection: Post-menopausal  Other Topics Concern   Not on file  Social History Narrative   Not on file   Social Drivers of Health   Financial Resource  Strain: Not on file  Food Insecurity: No Food Insecurity (05/11/2023)   Hunger Vital Sign    Worried About Running Out of Food in the Last Year: Never true    Ran Out of Food in the Last Year: Never true  Transportation Needs: No Transportation Needs (05/11/2023)   PRAPARE - Administrator, Civil Service (Medical): No    Lack of Transportation (Non-Medical): No  Physical Activity: Not on file  Stress: Not on file  Social Connections: Moderately Isolated (05/14/2023)   Social Connection and Isolation Panel [NHANES]    Frequency of Communication with Friends and Family: More than three times a week    Frequency of Social Gatherings with Friends and Family: More than three times a week    Attends Religious Services: Never    Database administrator or Organizations: No    Attends Banker Meetings: Never    Marital Status: Married  Catering manager Violence: Not At Risk (05/11/2023)   Humiliation, Afraid, Rape, and Kick questionnaire    Fear of Current or Ex-Partner: No    Emotionally Abused: No    Physically Abused: No    Sexually Abused: No      Family History  Problem Relation Age of Onset   Lung cancer Mother        d. 42   Heart attack Sister    Hypertension Sister    Atrial fibrillation Brother    Hypertension Brother    Heart attack Brother    Irregular heart beat Brother    Breast cancer Paternal Aunt        dx after 55   Breast cancer Cousin        paternal female cousin    There were no vitals filed for this visit.  Wt Readings from Last 3 Encounters:  05/21/23 47.3 kg (104 lb 4.4 oz)  05/10/23 53.1 kg (117 lb)  04/17/23 50.6 kg (111 lb 9.6 oz)     PHYSICAL EXAM: General:  Elderly appearing. No respiratory difficulty HEENT: normal Neck: supple. JVD to ear. Carotids 2+ bilat; no bruits. No lymphadenopathy or thryomegaly appreciated. Cor: PMI nondisplaced. Irregualr rhythm and regular rate. No rubs, gallops. +AS/MR murmur Lungs:  clear, diminished bases R>L Abdomen: soft, nontender, nondistended. No hepatosplenomegaly. No bruits or masses. Good bowel sounds. Extremities: no cyanosis, clubbing, rash, +3 bilateral edema to thigh Neuro: alert & oriented x 3, cranial nerves grossly intact. moves all 4 extremities w/o difficulty. Affect pleasant.  ECG: 02/28/23 AF 89  LBBB 1 PVC  ECG: 05/10/23 AF 91 LBBB No PVC Personally reviewed   ASSESSMENT & PLAN:  1. 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 Mild MR. Mild to moderate TR. Moderate to severe AS with mean gradient 28 mmHg, AVA 0.94 cm2, SVI 47, DI 0.25.   - Developed AF in 6/24 and was started on Eliquis. - Echo 04/05/23 EF < 20%. Severe enlargement of the left atrium. Mild to moderate MR. Moderately severe aortic stenosis with mean gradient 29 mmHg, AVA 0.82-0.88cm2, SVI 42, DI 0.32. 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 LBBB and PVCs may also be contributing though LBBB not that wide and no PVCs on ECG today  - NYHA II-III, - Given degree of volume overload, low BP and fraility, I think it is best to admit her for IV diuresis and GDMT initiation. While in hospital we can also follow HRs and PVC burden on tele.  - Increase lasix from 20 daily to 60 daily today. Plan IV diuresis starting tomorrow  - Check anemia panel with fatigue - UNNA boots tomorrow - Consider R/L cath to exclude high-grade CAD followed by attempt at restoring NSR once diuresed - Also consider cMRI    2. Aortic stenosis:  - followed by Dr. Clifton James - mostly in the moderate range - degree of LV dysfunction out of proportion to AS   3. Mitral regurgitation: Her echo is reviewed with the structural heart imagine team and the mitral regurgitation is mild to moderate.    4. PVCs/PACs: No recent palpitations.  - may also be contributing to cardiomyopathy - plan to monitor on tele   5. Atrial fibrillation,  persistent:  - Rate controlled atrial fibrillation. Question if this is contributing to her LV dysfunction given time course of events.  - Monitor on tele, plan to start amiodarone in the hospital with eventual DC-CV either in hospital or as outpatient - Continue Eliquis. Denies abnormal bleeding CBC today  Plan for direct admission tomorrow for IV diuresis. Patient and family agreeable to plan.    Jacklynn Ganong, FNP  12:43 PM

## 2023-06-05 ENCOUNTER — Inpatient Hospital Stay (HOSPITAL_COMMUNITY)
Admission: RE | Admit: 2023-06-05 | Discharge: 2023-06-05 | Disposition: A | Discharge status: Home / Self Care | Payer: Self-pay | Source: Ambulatory Visit

## 2023-06-05 ENCOUNTER — Ambulatory Visit (HOSPITAL_COMMUNITY): Payer: Self-pay | Admitting: Internal Medicine

## 2023-06-20 ENCOUNTER — Encounter (HOSPITAL_COMMUNITY): Payer: Self-pay

## 2023-06-20 ENCOUNTER — Ambulatory Visit (HOSPITAL_BASED_OUTPATIENT_CLINIC_OR_DEPARTMENT_OTHER)
Admission: RE | Admit: 2023-06-20 | Discharge: 2023-06-20 | Disposition: A | Discharge status: Home / Self Care | Payer: No Typology Code available for payment source | Source: Ambulatory Visit | Attending: Internal Medicine | Admitting: Internal Medicine

## 2023-06-20 ENCOUNTER — Encounter (HOSPITAL_COMMUNITY): Payer: Self-pay | Admitting: Physician Assistant

## 2023-06-20 ENCOUNTER — Ambulatory Visit (HOSPITAL_COMMUNITY)
Admission: RE | Admit: 2023-06-20 | Discharge: 2023-06-20 | Disposition: A | Discharge status: Home / Self Care | Payer: No Typology Code available for payment source | Source: Ambulatory Visit | Attending: Family Medicine | Admitting: Family Medicine

## 2023-06-20 VITALS — BP 118/76 | HR 78 | Ht 64.0 in | Wt 113.0 lb

## 2023-06-20 VITALS — BP 118/76 | HR 78 | Wt 113.0 lb

## 2023-06-20 DIAGNOSIS — I447 Left bundle-branch block, unspecified: Secondary | ICD-10-CM | POA: Insufficient documentation

## 2023-06-20 DIAGNOSIS — I083 Combined rheumatic disorders of mitral, aortic and tricuspid valves: Secondary | ICD-10-CM | POA: Insufficient documentation

## 2023-06-20 DIAGNOSIS — I350 Nonrheumatic aortic (valve) stenosis: Secondary | ICD-10-CM | POA: Diagnosis not present

## 2023-06-20 DIAGNOSIS — I340 Nonrheumatic mitral (valve) insufficiency: Secondary | ICD-10-CM

## 2023-06-20 DIAGNOSIS — N1832 Chronic kidney disease, stage 3b: Secondary | ICD-10-CM | POA: Insufficient documentation

## 2023-06-20 DIAGNOSIS — Z5181 Encounter for therapeutic drug level monitoring: Secondary | ICD-10-CM | POA: Insufficient documentation

## 2023-06-20 DIAGNOSIS — I493 Ventricular premature depolarization: Secondary | ICD-10-CM | POA: Diagnosis not present

## 2023-06-20 DIAGNOSIS — Z79899 Other long term (current) drug therapy: Secondary | ICD-10-CM

## 2023-06-20 DIAGNOSIS — N183 Chronic kidney disease, stage 3 unspecified: Secondary | ICD-10-CM

## 2023-06-20 DIAGNOSIS — I5022 Chronic systolic (congestive) heart failure: Secondary | ICD-10-CM | POA: Insufficient documentation

## 2023-06-20 DIAGNOSIS — D6869 Other thrombophilia: Secondary | ICD-10-CM | POA: Diagnosis not present

## 2023-06-20 DIAGNOSIS — Z853 Personal history of malignant neoplasm of breast: Secondary | ICD-10-CM | POA: Insufficient documentation

## 2023-06-20 DIAGNOSIS — Z7901 Long term (current) use of anticoagulants: Secondary | ICD-10-CM | POA: Insufficient documentation

## 2023-06-20 DIAGNOSIS — I4819 Other persistent atrial fibrillation: Secondary | ICD-10-CM

## 2023-06-20 LAB — CBC
HCT: 36.3 % (ref 36.0–46.0)
Hemoglobin: 12.1 g/dL (ref 12.0–15.0)
MCH: 30.4 pg (ref 26.0–34.0)
MCHC: 33.3 g/dL (ref 30.0–36.0)
MCV: 91.2 fL (ref 80.0–100.0)
Platelets: 262 10*3/uL (ref 150–400)
RBC: 3.98 MIL/uL (ref 3.87–5.11)
RDW: 15.8 % — ABNORMAL HIGH (ref 11.5–15.5)
WBC: 9.6 10*3/uL (ref 4.0–10.5)
nRBC: 0 % (ref 0.0–0.2)

## 2023-06-20 LAB — BASIC METABOLIC PANEL
Anion gap: 11 (ref 5–15)
BUN: 18 mg/dL (ref 8–23)
CO2: 25 mmol/L (ref 22–32)
Calcium: 8.4 mg/dL — ABNORMAL LOW (ref 8.9–10.3)
Chloride: 103 mmol/L (ref 98–111)
Creatinine, Ser: 1.5 mg/dL — ABNORMAL HIGH (ref 0.44–1.00)
GFR, Estimated: 35 mL/min — ABNORMAL LOW (ref >60)
Glucose, Bld: 111 mg/dL — ABNORMAL HIGH (ref 70–99)
Potassium: 4.3 mmol/L (ref 3.5–5.1)
Sodium: 139 mmol/L (ref 135–145)

## 2023-06-20 LAB — BRAIN NATRIURETIC PEPTIDE: B Natriuretic Peptide: >4500 pg/mL — ABNORMAL HIGH (ref 0.0–100.0)

## 2023-06-20 MED ORDER — FUROSEMIDE 20 MG PO TABS: 40.0000 mg | ORAL_TABLET | Freq: Every day | ORAL | 11 refills | Status: DC

## 2023-06-20 MED ORDER — SPIRONOLACTONE 25 MG PO TABS: 12.5000 mg | ORAL_TABLET | Freq: Every day | ORAL | 3 refills | Status: DC

## 2023-06-20 NOTE — Progress Notes (Signed)
 ADVANCED HF CLINIC CONSULT NOTE   Primary Care: Claudene Pellet, MD Primary Cardiologist: Lonni Cash, MD HF Cardiologist: Dr. Cherrie  HPI: Ms. Africa is an 83 y.o. female with history of persistent AF, breast cancer, aortic stenosis. LBBB and recently diagnosed systolic HF.   Echo 12/23 EF 50%. Mild MR. Mild to moderate TR. Moderate to severe AS with mean gradient 28 mmHg, AVA 0.94 cm2, SVI 47, DI 0.25.  Plan was to follow her moderate AS with repeat echo in one year.   Developed AF in 6/24 and was started on Eliquis . Developed HF symptoms 02/2023. Echo 04/05/23 EF < 20%. Severe enlargement of the left atrium. Mild to moderate MR. Moderately severe aortic stenosis with mean gradient 29 mmHg, AVA 0.82-0.88cm2, SVI 42, DI 0.32.   She saw Dr. Cash on 04/17/23 and volume status looked good on lasix  20 daily, but BP too low to start GDMT. She was referred to AHF clinic for further evaluation.  Seen in the AHF Clinic 05/10/23 to establish care. She was severely volume overloaded. Given overload with low BP and fragility, decision was made to admit for IV diuretics. She was diuresed and started on milrinone . She underwent R/LHC which showed mild coronary disease (30% Cx stenosis), elevated right and left filling pressures, pulmonary venous hypertension and good CO on milrinone  0.25. For her AF, she underwent TEE guided DCCV to NSR. She was maintained of low dose amiodarone , digoxin  stopped. TEE confirmed low flow/low gradient aortic stenosis and moderate mitral regurgitation. GDMT was titrated and she was discharged to ALF, weight 104 lbs.  Today she returns for post hospital HF follow up. Overall feeling fine. She remains at ALF at [facility]. She has fatigue. She is not SOB walking on flat ground. Legs swell sometimes, takes Lasix  daily. Denies syncope, palpitations, abnormal bleeding, CP, dizziness, or PND/Orthopnea. Appetite ok. No fever or chills. Taking all medications provided  by facility. Wants to be home and she misses her husband. Son and DIL live in [place] .  Family history: Brother passed away, had a fib. Sister has an ICD. Sister passed away from CAD.    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     apixaban  (ELIQUIS ) 2.5 MG TABS tablet Take 1 tablet (2.5 mg total) by mouth 2 (two) times daily. 60 tablet 5   Biotin 1 MG CAPS Take 1 mg by mouth daily.     furosemide  (LASIX ) 20 MG tablet Take 1 tablet as needed for weight gain more than 3 lb in a day or 5 lb in a week or worsening leg edema, shortness of breath (Patient taking differently: Take 20 mg by mouth daily. Take 1 tablet (20 mg)  by mouth daily)     letrozole  (FEMARA ) 2.5 MG tablet Take 1 tablet (2.5 mg total) by mouth daily. 90 tablet 3   Multiple Vitamin (MULTIVITAMIN) tablet Take 1  tablet by mouth daily.     Omega-3 Fatty Acids (FISH OIL) 1000 MG CAPS Take 1,000 mg by mouth daily.     potassium chloride  SA (KLOR-CON  M) 20 MEQ tablet Take 1 tablet as needed when taking lasix  (Patient not taking: Reported on 06/20/2023)     No current facility-administered medications for this encounter.    No Known Allergies    Social History   Socioeconomic History   Marital status: Married    Spouse name: Not on file   Number of children: 3   Years of education: Not on file   Highest education level:  Not on file  Occupational History   Occupation: Retired Information Systems Manager  Tobacco Use   Smoking status: Never   Smokeless tobacco: Never  Vaping Use   Vaping status: Never Used  Substance and Sexual Activity   Alcohol use: Not Currently   Drug use: Never   Sexual activity: Not Currently    Birth control/protection: Post-menopausal  Other Topics Concern   Not on file  Social History Narrative   Not on file   Social Drivers of Health   Financial Resource Strain: Not on file  Food Insecurity: No Food Insecurity (05/11/2023)   Hunger Vital Sign    Worried About Running Out of Food in the Last Year: Never true    Ran Out of Food in the Last Year: Never true  Transportation Needs: No Transportation Needs (05/11/2023)   PRAPARE - Administrator, Civil Service (Medical): No    Lack of Transportation (Non-Medical): No  Physical Activity: Not on file  Stress: Not on file  Social Connections: Moderately Isolated (05/14/2023)   Social Connection and Isolation Panel [NHANES]    Frequency of Communication with Friends and Family: More than three times a week    Frequency of Social Gatherings with Friends and Family: More than three times a week    Attends Religious Services: Never    Database Administrator or Organizations: No    Attends Banker Meetings: Never    Marital Status: Married  Catering Manager Violence: Not At Risk (05/11/2023)   Humiliation, Afraid, Rape, and Kick questionnaire    Fear of Current or Ex-Partner: No    Emotionally Abused: No    Physically Abused: No    Sexually Abused: No      Family History  Problem Relation Age of Onset   Lung cancer Mother        d. 101   Heart attack Sister    Hypertension Sister    Atrial fibrillation Brother    Hypertension Brother    Heart attack Brother    Irregular heart beat Brother    Breast cancer Paternal Aunt        dx after 41   Breast cancer Cousin        paternal female cousin   BP 118/76    Pulse 78   Wt 51.3 kg (113 lb)   SpO2 95%   BMI 19.40 kg/m   Wt Readings from Last 3 Encounters:  06/20/23 51.3 kg (113 lb)  05/21/23 47.3 kg (104 lb 4.4 oz)  05/10/23 53.1 kg (117 lb)    PHYSICAL EXAM: General:  NAD. No resp difficulty, walked into clinic with RW, elderly HEENT: Normal Neck: Supple. JVP 10 Cor: Regular rate & rhythm. No rubs, gallops, 3/6 SEM RUSB Lungs: Clear Abdomen: Soft, nontender, nondistended.  Extremities: No cyanosis, clubbing, rash, 1-2+ BLE pre-tibial edema Neuro: Alert &  oriented x 3, moves all 4 extremities w/o difficulty. Affect pleasant.  ECG (personally reviewed): appears AF vs junctional, rate controlled.  ReDs reading: 41%, abnormal  ASSESSMENT & PLAN: 1. Chronic systolic CHF:  - Echo in 12/23 with EF 50%, moderate-severe AS.   - Developed atrial fibrillation in 6/24 but rate has been controlled.  She has LBBB - Echo 11/24 shows EF 20-25% with septal-lateral dyssynchrony consistent with LBBB, mild RV dysfunction, possible low flow/low gradient severe AS, mild to mod MR - Cause of drop in EF is uncertain.  AS does not appear critical but think severe (see below).  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 She is volume overloaded on exam, ReDs elevated at 41% - Increase Lasix  to 40 mg daily. - Start spironolactone  12.5 mg daily. - Hold off on SGLT2i with frailty. - No beta blocker yet - Off dig with recent bradycardia - Labs today, repeat BMET in 1 week  2. Atrial fibrillation: - Persistent since at least 6/24.  - She has a severely dilated LA, doubt she will hold NSR without anti-arrhythmic.  - s/p TEE-guided DCCV 12/24 to NSR - Per EP she is not a candidate for CRT-D with frailty, LV dilation and body mass.  - Off digoxin  with  bradycardia - Continue amiodarone  200 mg daily. - Continue Eliquis  2.5 mg bid. No bleeding issues - ECG reviewed with Dr. Cherrie, appears AF vs  junctional - She has follow up with AF clinic today. - CBC today.  3. Aortic stenosis:  - Suspected low flow/low gradient severe AS with mean gradient 34 mmHg and AVA 0.83.  TEE confirmed low flow/low gradient severe AS.  - Refer to Dr Wonda, Structural Heart Team for TAVR evaluation.  4. Mitral regurgitation:  - Moderate on TEE 12/24 - HF optimization as above  5. PVCs: - Occasionally on telemetry during admission - She denies palps, none on ECG today - Continue amiodarone   6. CKD IIIb:  - baseline Scr 1.2-1.4 - Follow renal function with addition of MRA - BMET today.  Follow up in 6 weeks with APP. I called her daughter-in-law, Hortense, during today's visit and discussed findings and plan.  Harlene Gainer, FNP-BC 06/20/23

## 2023-06-20 NOTE — Progress Notes (Signed)
 ReDS Vest / Clip - 06/20/23 1300       ReDS Vest / Clip   Station Marker A    Ruler Value 25    ReDS Value Range High volume overload    ReDS Actual Value 41

## 2023-06-20 NOTE — Patient Instructions (Addendum)
 Thank you for coming in today  If you had labs drawn today, any labs that are abnormal the clinic will call you No news is good news  Medications: Start Spironolactone  12.5 mg 1/2 tablet daily  Increase Lasix  to 40 mg daily   Follow up appointments: Your physician recommends that you return for lab work in:  1 week BMET  Your physician recommends that you schedule a follow-up appointment in:  6 weeks in clinic   Do the following things EVERYDAY: Weigh yourself in the morning before breakfast. Write it down and keep it in a log. Take your medicines as prescribed Eat low salt foods--Limit salt (sodium) to 2000 mg per day.  Stay as active as you can everyday Limit all fluids for the day to less than 2 liters   At the [facility], you and your health needs are our priority. As part of our continuing mission to provide you with exceptional heart care, we have created designated Provider Care Teams. These Care Teams include your primary Cardiologist (physician) and Advanced Practice Providers (APPs- Physician Assistants and Nurse Practitioners) who all work together to provide you with the care you need, when you need it.   You may see any of the following providers on your designated Care Team at your next follow up: Dr Toribio Fuel Dr Ezra Shuck Dr. Ria Gardenia Greig Lenetta, NP Caffie Shed, [place] [facility] [place], [place] Beckey Coe, NP Tinnie Redman, PharmD   Please be sure to bring in all your medications bottles to every appointment.    Thank you for choosing [facility]  If you have any questions or concerns before your next appointment please send us  a message through [place] or call our office at [phone].    TO LEAVE A MESSAGE FOR THE NURSE SELECT OPTION 2, PLEASE LEAVE A MESSAGE INCLUDING: YOUR NAME DATE OF BIRTH CALL BACK NUMBER REASON FOR CALL**this is important as we prioritize the  call backs  YOU WILL RECEIVE A CALL BACK THE SAME DAY AS LONG AS YOU CALL BEFORE 4:00 PM

## 2023-06-20 NOTE — Progress Notes (Signed)
 Primary Care Physician: Claudene Pellet, MD Primary Cardiologist: Lonni Cash, MD Electrophysiologist: OLE ONEIDA HOLTS, MD  Referring Physician: ED   Tara Hardin is a 83 y.o. female with a history of HFrEF, LBBB, breast cancer, aortic stenosis, atrial fibrillation who presents for follow up in the [facility]. Patient is on Eliquis  for stroke prevention. Her diagnosis of atrial fibrillation dates to June 2024. At that time she was started on Eliquis . Her EF shortly after diagnosis showed an ejection fraction of less than 20% with severe left atrial enlargement and severe aortic stenosis. She presented to heart failure clinic on May 10 2023 markedly volume overloaded. She was admitted to the hospital for IV diuresis. She was started on amiodarone . She was started on milrinone  and diuresed. She underwent a TEE/DCCV on May 15 2023. She was bradycardic post DCCV and EP was consulted. Her digoxin  was discontinued.   Patient presents today for follow up for atrial fibrillation. Patient was seen in the [facility] clinic earlier today and was found to be fluid overloaded. Her lasix  was increased and she was started on spironolactone . Patient remains in SR. No bleeding issues on anticoagulation.   Today, she denies symptoms of palpitations, chest pain, orthopnea, PND, dizziness, presyncope, syncope, snoring, daytime somnolence, bleeding, or neurologic sequela. The patient is tolerating medications without difficulties and is otherwise without complaint today.    Atrial Fibrillation Risk Factors:  she does not have symptoms or diagnosis of sleep apnea. she does not have a history of rheumatic fever.   Atrial Fibrillation Management history:  Previous antiarrhythmic drugs: amiodarone   Previous cardioversions: 05/15/23 Previous ablations: none Anticoagulation history: Eliquis   ROS- All systems are reviewed and negative except as per the HPI  above.  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     apixaban  (ELIQUIS ) 2.5 MG TABS tablet Take 1 tablet (2.5 mg total) by mouth 2 (two) times daily. 60 tablet 5   Biotin 1 MG CAPS Take 1 mg by mouth daily.     furosemide  (LASIX ) 20 MG tablet Take 2 tablets (40 mg total) by mouth daily. 60 tablet 11   letrozole  (FEMARA ) 2.5 MG tablet Take 1 tablet (2.5 mg total) by mouth daily. 90 tablet 3   Multiple Vitamin (MULTIVITAMIN) tablet Take 1 tablet by mouth daily.     Omega-3 Fatty Acids (FISH OIL) 1000 MG CAPS Take 1,000 mg by mouth daily.     potassium chloride  SA (KLOR-CON  M) 20 MEQ tablet Take 1 tablet as needed when taking lasix      spironolactone  (ALDACTONE ) 25 MG tablet Take 0.5 tablets (12.5 mg total) by mouth daily. 45 tablet 3   No current facility-administered medications for this encounter.    Physical Exam: BP 118/76   Pulse 78   Ht 5' 4 (1.626 m)   Wt 51.3 kg   BMI 19.40  kg/m   GEN: Well nourished, well developed in no acute distress CARDIAC: Regular rate and rhythm, no rubs, gallops, 3/6 systolic murmur  RESPIRATORY:  Clear to auscultation without rales, wheezing or rhonchi  ABDOMEN: Soft, non-tender, non-distended EXTREMITIES:  1+ bilateral lower extremity edema, No deformity   Wt Readings from Last 3 Encounters:  06/20/23 51.3 kg  06/20/23 51.3 kg  05/21/23  47.3 kg     EKG reviewed from [facility] earlier today SR with 1st degree AV block, LBBB (reviewed with EP) Vent. rate 78 BPM PR interval * ms QRS duration 130 ms QT/QTcB 454/517 ms   Echo 04/05/23 demonstrated   1. Spontaneoous echo contrast noted in LV.SABRA Left ventricular ejection  fraction, by estimation, is <20%. The left ventricle has severely  decreased function. The left ventricle has no regional wall motion  abnormalities. Left ventricular diastolic parameters are indeterminate.   2. Right ventricular systolic function is normal. The right ventricular  size is normal.   3. Left atrial size was severely dilated.   4. Right atrial size was moderately dilated.   5. The mitral valve is abnormal. Severe mitral valve regurgitation. No  evidence of mitral stenosis.   6. Severely depressed systolic and degree of MR may underestimate AS.  Clinical correlation suggested.. The aortic valve was not well visualized.  Aortic valve regurgitation is mild. Moderate aortic valve stenosis.   7. The inferior vena cava is normal in size with greater than 50%  respiratory variability, suggesting right atrial pressure of 3 mmHg.   Comparison(s): Echocardiogram done 05/09/22 showed an EF of 45-50% with moderate to severe AS and an AV Mean Grad of 28.7 mmHg.    CHA2DS2-VASc Score = 4  The patient's score is based upon: CHF History: 1 HTN History: 0 Diabetes History: 0 Stroke History: 0 Vascular Disease History: 0 Age Score: 2 Gender Score: 1       ASSESSMENT AND PLAN: Persistent Atrial Fibrillation (ICD10:  I48.19) The patient's CHA2DS2-VASc score is 4, indicating a 4.8% annual risk of stroke.   Patient appears to be maintaining SR Continue amiodarone  200 mg daily Continue Eliquis  2.5 mg BID  Secondary Hypercoagulable State (ICD10:  D68.69) The patient is at significant risk for stroke/thromboembolism based upon her CHA2DS2-VASc Score of 4.  Continue Apixaban  (Eliquis ).   High Risk  Medication Monitoring (ICD 10: Z79.899) Long PR interval at baseline. Intervals appropriate for amiodarone  monitoring.    Chronic HFrEF EF <20% GDMT per [facility] Fluid overloaded today. Medications adjusted by Harlene Gainer.   VHD Moderate MR Severe AS Followed by Dr Verlin. ? Candidate for TAVR.    Follow up in the AF clinic in 3 months.        Daril Kicks PA-C [facility] [address] [place], [place] [address] [phone]

## 2023-06-21 DIAGNOSIS — I4819 Other persistent atrial fibrillation: Secondary | ICD-10-CM | POA: Insufficient documentation

## 2023-06-21 DIAGNOSIS — D6869 Other thrombophilia: Secondary | ICD-10-CM | POA: Insufficient documentation

## 2023-06-21 DIAGNOSIS — Z5181 Encounter for therapeutic drug level monitoring: Secondary | ICD-10-CM | POA: Insufficient documentation

## 2023-06-21 DIAGNOSIS — Z79899 Other long term (current) drug therapy: Secondary | ICD-10-CM | POA: Insufficient documentation

## 2023-06-29 ENCOUNTER — Telehealth (HOSPITAL_COMMUNITY): Payer: Self-pay

## 2023-06-29 NOTE — Telephone Encounter (Signed)
Tried calling patient, no answer,unable to leave message. Will try again later.

## 2023-06-29 NOTE — Telephone Encounter (Signed)
Patient called wanting to know how long she should continue the lasix 40mg . She is concerned about her weight and legs. She states she feels as if she's getting too small. Please advise.

## 2023-07-03 ENCOUNTER — Institutional Professional Consult (permissible substitution): Payer: No Typology Code available for payment source | Admitting: Cardiovascular Disease

## 2023-07-03 NOTE — Telephone Encounter (Signed)
 Pt returned call reports labs were drawn today and will await answer regarding labs

## 2023-07-12 ENCOUNTER — Ambulatory Visit: Payer: No Typology Code available for payment source | Admitting: Cardiovascular Disease

## 2023-07-16 ENCOUNTER — Other Ambulatory Visit: Payer: Self-pay

## 2023-07-16 ENCOUNTER — Ambulatory Visit
Payer: No Typology Code available for payment source | Attending: Cardiovascular Disease | Admitting: Cardiovascular Disease

## 2023-07-16 ENCOUNTER — Encounter: Payer: Self-pay | Admitting: Cardiovascular Disease

## 2023-07-16 VITALS — BP 100/62 | HR 73 | Ht 64.0 in | Wt 105.0 lb

## 2023-07-16 DIAGNOSIS — I350 Nonrheumatic aortic (valve) stenosis: Secondary | ICD-10-CM

## 2023-07-16 DIAGNOSIS — N183 Chronic kidney disease, stage 3 unspecified: Secondary | ICD-10-CM | POA: Diagnosis not present

## 2023-07-16 NOTE — Progress Notes (Signed)
 Structural Heart Clinic Note  Chief Complaint  Patient presents with   Follow-up    Aortic stenosis   History of Present Illness: 83 yo female with history of atrial fibrillation, breast cancer, hyperlipidemia, aortic stenosis, mitral regurgitation and tricuspid valve regurgitation here today for follow up. I met her in July 2022.  She had been evaluated in March 2022 by Dr. Bing Matter. Echo December 2023 with LVEF=50%. Mild MR. Mild to moderate TR. Moderate to severe AS with mean gradient 28 mmHg, AVA 0.94 cm2, SVI 47, DI 0.25.  Plan was to follow her moderate AS with repeat echo in one year. I saw her in June 2024 and she was doing well. She was found to be in atrial fibrillation at her visit in June 2024 and was started on Eliquis. She was seen in primary care in early October 2024 and reported dyspnea and LE edema. BNP over 2000. She was started on Lasix and potassium. I saw her in the office 02/28/23 and she reported improvement in her dyspnea but still had some LE edema. I arranged an echo on 04/05/23 which showed severe LV systolic dysfunction with LVEF less than 20%. Severe enlargement of the left atrium. Mild to moderate mitral regurgitation. Moderately severe aortic stenosis with mean gradient 29 mmHg, AVA 0.82-0.88cm2, SVI 42, DI 0.32.  I saw her on 04/17/23 and she was doing well overall with mild dyspnea. Her BP was too low to start GDMT. I referred her to our Advanced Heart Failure clinic. She was seen on 05/10/23 by Dr. Gala Romney on 05/10/23 and was admitted for inpatient diuresis with milrinone. Cardiac cath 05/14/23 with mild non-obstructive CAD. PCWP 28. TEE/DCCV on 05/15/23 with LVEF=20-25%, moderate RV dysfunction, moderate MR, severe low flow/low gradient aortic stenosis (mean gradient 29 mm Hg, AVA 0.80 cm2, DI 0.20, SVI 39). She converted to sinus with 3 shocks and was placed on amiodarone. EP consulted for CRT-D but not felt to be a candidate secondary to frailty, LV dilation and  body mass. She was discharged to an assisted living facility. She was seen in the AHF clinic on 06/20/23 and was doing well with ongoing fatigue. Her lasix was increased to 40 mg daily. Aldactone was started. She was in sinus rhythm. She was seen in the Atrial Fib clinic 06/20/23 and no changes were made. Her husband passed away on 2023/07/23.   She is referred back to my office today to review candidacy for TAVR. She denies any chest pain, palpitations, lower extremity edema, orthopnea, PND, dizziness, near syncope or syncope. She continues to have some dyspnea on exertion but this is much improved since she was admitted and diuresed. She is currently living in [facility]. She has no active dental issues.     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   Nonrheumatic tricuspid (valve) insufficiency 07/21/2020   Prediabetes 08/30/2020    Past Surgical History:  Procedure Laterality Date   BREAST LUMPECTOMY WITH RADIOACTIVE SEED LOCALIZATION Left 08/11/2021   Procedure: LEFT BREAST LUMPECTOMY WITH RADIOACTIVE SEED LOCALIZATION X2;  Surgeon: Griselda Miner, MD;  Location: [facility];  Service: General;  Laterality: Left;   CARDIOVERSION N/A 05/15/2023   Procedure: CARDIOVERSION;  Surgeon: Laurey Morale, MD;  Location: [facility] INVASIVE CV LAB;  Service: Cardiovascular;  Laterality: N/A;   RE-EXCISION OF BREAST CANCER,SUPERIOR MARGINS Left 10/07/2021   Procedure: RE-EXCISION OF LEFT BREAST INFERIOR MARGIN;  Surgeon: Griselda Miner, MD;  Location: [facility];  Service: General;   Laterality: Left;   RIGHT HEART CATH AND CORONARY ANGIOGRAPHY N/A 05/14/2023   Procedure: RIGHT HEART CATH AND CORONARY ANGIOGRAPHY;  Surgeon: Laurey Morale, MD;  Location: [facility] INVASIVE CV LAB;  Service: Cardiovascular;  Laterality: N/A;   TRANSESOPHAGEAL ECHOCARDIOGRAM (CATH LAB) N/A 05/15/2023   Procedure: TRANSESOPHAGEAL ECHOCARDIOGRAM;  Surgeon: Laurey Morale, MD;  Location: [facility] INVASIVE CV LAB;  Service: Cardiovascular;  Laterality: N/A;    Current Outpatient Medications  Medication Sig Dispense Refill   amiodarone (PACERONE) 200 MG tablet Take 1 tablet (200 mg total) by mouth daily.     apixaban (ELIQUIS) 2.5 MG TABS tablet Take 1 tablet (2.5 mg total) by mouth 2 (two) times daily. 60 tablet 5   Biotin 1 MG CAPS Take 1 mg by mouth daily.     furosemide (LASIX) 20 MG tablet Take 2 tablets (40 mg total) by mouth daily. 60 tablet 11   letrozole (FEMARA) 2.5 MG tablet Take 1 tablet (2.5 mg total) by mouth daily. 90 tablet 3   Multiple Vitamin (MULTIVITAMIN) tablet Take 1 tablet by mouth daily.     Omega-3 Fatty Acids (FISH OIL) 1000 MG CAPS Take 1,000 mg by mouth daily.     spironolactone (ALDACTONE) 25 MG tablet Take 0.5 tablets (12.5 mg total) by mouth daily. 45 tablet 3   potassium chloride SA (KLOR-CON M) 20 MEQ tablet Take 1 tablet as needed when taking lasix (Patient not taking: Reported on 07/16/2023)     No current facility-administered medications for this visit.    No Known Allergies  Social History   Socioeconomic History   Marital status: Married    Spouse name: Not on file   Number of children: 3   Years of education: Not on file   Highest education level: Not on file  Occupational History   Occupation: Retired Information systems manager  Tobacco Use   Smoking status: Never   Smokeless tobacco: Never   Tobacco comments:    Never smoked 06/20/23  Vaping Use   Vaping status: Never Used  Substance and Sexual Activity   Alcohol use: Not Currently   Drug use: Never   Sexual  activity: Not Currently    Birth control/protection: Post-menopausal  Other Topics Concern   Not on file  Social History Narrative   Not on file   Social Drivers of Health   Financial Resource Strain: Not on file  Food Insecurity: No Food Insecurity (05/11/2023)   Hunger Vital Sign    Worried About Running Out of Food in the Last Year: Never true    Ran Out of Food in the Last Year: Never true  Transportation Needs: No Transportation Needs (05/11/2023)   PRAPARE - Administrator, Civil Service (Medical): No    Lack of Transportation (Non-Medical): No  Physical Activity: Not on file  Stress: Not on file  Social Connections: Moderately Isolated (05/14/2023)   Social Connection and Isolation Panel [NHANES]    Frequency of Communication with Friends and Family: More than three times a week    Frequency of Social Gatherings with  Friends and Family: More than three times a week    Attends Religious Services: Never    Database administrator or Organizations: No    Attends Banker Meetings: Never    Marital Status: Married  Catering manager Violence: Not At Risk (05/11/2023)   Humiliation, Afraid, Rape, and Kick questionnaire    Fear of Current or Ex-Partner: No    Emotionally Abused: No    Physically Abused: No    Sexually Abused: No    Family History  Problem Relation Age of Onset   Lung cancer Mother        d. 96   Heart attack Sister    Hypertension Sister    Atrial fibrillation Brother    Hypertension Brother    Heart attack Brother    Irregular heart beat Brother    Breast cancer Paternal Aunt        dx after 57   Breast cancer Cousin        paternal female cousin    Review of Systems:  As stated in the HPI and otherwise negative.   BP 100/62   Pulse 73   Ht 5\' 4"  (1.626 m)   Wt 47.6 kg   SpO2 99%   BMI 18.02 kg/m   Physical Examination: General: thin female in NAD.  HEENT: OP clear SKIN: warm, dry. No rashes. Neuro: No focal  deficits  Musculoskeletal: Muscle strength 5/5 all ext  Psychiatric: Mood and affect normal  Neck: No JVD Lungs:Clear bilaterally, no wheezes, rhonci, crackles Cardiovascular: Regular rate and rhythm. Harsh systolic murmur.  Abdomen:Soft. Bowel sounds present. Non-tender.  Extremities: No lower extremity edema.  EKG:  EKG is not ordered today. The ekg ordered today demonstrates   Cardiac cath 05/14/23: 1. Nonobstructive mild coronary disease. 2. LV and RV filling pressures remain elevated.  3. Pulmonary venous hypertension.  4. Good cardiac output on milrinone 0.25.    Surgeon Notes    05/15/2023 11:03 AM CV Procedure signed by Laurey Morale, MD   Procedural Details  Technical Details Procedure: Right Heart Cath, Selective Coronary Angiography  Indication: CHF   Procedural Details: The left brachial, right radial, and right groin were prepped, draped, and anesthetized with 1% lidocaine. There was a pre-existing peripheral IV in the left brachial area that was replaced with a 767F venous sheath. A Swan-Ganz catheter was used for the right heart catheterization. Standard protocol was followed for recording of right heart pressures and sampling of oxygen saturations. Fick cardiac output was calculated. The right radial artery was entered using modified Seldinger technique and a 67F sheath was placed.  The patient received 3 mg IA verapamil.  We were unable to advance the wire beyond the elbow due to small, tortuous radial artery so decision was made to proceed from femoral access.  The right common femoral artery was entered using modified Seldinger technique, micropuncture, and ultrasound guidance.  Standard Judkins catheters were used for selective coronary angiography. There were no immediate procedural complications. The patient was transferred to the post catheterization recovery area for further monitoring.    Estimated blood loss <50 mL.   During this procedure no sedation was  administered.   Medications (Filter: Administrations occurring from 1021 to 1234 on 05/14/23)  important  Continuous medications are totaled by the amount administered until 05/14/23 1234.   Heparin (Porcine) in NaCl 1000-0.9 UT/500ML-% SOLN (mL)  Total volume: 1,000 mL Date/Time Rate/Dose/Volume Action   05/14/23 1059 500 mL Given  1059 500 mL Given   lidocaine (PF) (XYLOCAINE) 1 % injection (mL)  Total volume: 12 mL Date/Time Rate/Dose/Volume Action   05/14/23 1112 2 mL Given   1141 10 mL Given   ondansetron (ZOFRAN) injection (mg)  Total dose: 4 mg Date/Time Rate/Dose/Volume Action   05/14/23 1125 4 mg Given   midazolam (VERSED) injection (mg)  Total dose: 1 mg Date/Time Rate/Dose/Volume Action   05/14/23 1125 1 mg Given   fentaNYL (SUBLIMAZE) injection (mcg)  Total dose: 25 mcg Date/Time Rate/Dose/Volume Action   05/14/23 1108 25 mcg Given   iohexol (OMNIPAQUE) 350 MG/ML injection (mL)  Total volume: 50 mL Date/Time Rate/Dose/Volume Action   05/14/23 1157 50 mL Given   Chlorhexidine Gluconate Cloth 2 % PADS 6 each (each)  Total dose: Cannot be calculated* Dosing weight: 53.1 *Administration dose not documented Date/Time Rate/Dose/Volume Action   05/14/23 1035 *Not included in total MAR Hold   mupirocin ointment (BACTROBAN) 2 % 1 Application (Application)  Total dose: Cannot be calculated* Dosing weight: 53.1 *Administration dose not documented Date/Time Rate/Dose/Volume Action   05/14/23 1035 *Not included in total MAR Hold   acetaminophen (TYLENOL) tablet 650 mg (mg)  Total dose: Cannot be calculated* Dosing weight: 53.1 *Administration dose not documented Date/Time Rate/Dose/Volume Action   05/14/23 1035 *Not included in total MAR Hold   amiodarone (PACERONE) tablet 400 mg (mg)  Total dose: Cannot be calculated* Dosing weight: 50.7 *Administration dose not documented Date/Time Rate/Dose/Volume Action   05/14/23 1035 *Not included in total MAR Hold   digoxin  (LANOXIN) tablet 0.0625 mg (mg)  Total dose: Cannot be calculated* Dosing weight: 49.3 *Administration dose not documented Date/Time Rate/Dose/Volume Action   05/14/23 1035 *Not included in total MAR Hold   diphenhydrAMINE (BENADRYL) 12.5 MG/5ML elixir 12.5 mg (mg)  Total dose: Cannot be calculated* Dosing weight: 53.1 *Administration dose not documented Date/Time Rate/Dose/Volume Action   05/14/23 1035 *Not included in total MAR Hold   letrozole [facility]) tablet 2.5 mg (mg)  Total dose: Cannot be calculated* Dosing weight: 53.1 *Administration dose not documented Date/Time Rate/Dose/Volume Action   05/14/23 1035 *Not included in total MAR Hold   potassium chloride (KLOR-CON M) CR tablet 10 mEq (mEq)  Total dose: Cannot be calculated* *Administration dose not documented Date/Time Rate/Dose/Volume Action   05/14/23 1035 *Not included in total MAR Hold   sodium chloride flush (NS) 0.9 % injection 10-40 mL (mL)  Total dose: Cannot be calculated* Dosing weight: 50.7 *Administration dose not documented Date/Time Rate/Dose/Volume Action   05/14/23 1035 *Not included in total MAR Hold   sodium chloride flush (NS) 0.9 % injection 10-40 mL (mL)  Total dose: Cannot be calculated* Dosing weight: 50.7 *Administration dose not documented Date/Time Rate/Dose/Volume Action   05/14/23 1035 *Not included in total MAR Hold   sodium chloride flush (NS) 0.9 % injection 3 mL (mL)  Total dose: Cannot be calculated* Dosing weight: 53.1 *Administration dose not documented Date/Time Rate/Dose/Volume Action   05/14/23 1035 *Not included in total MAR Hold   sodium chloride flush (NS) 0.9 % injection 3 mL (mL)  Total dose: Cannot be calculated* Dosing weight: 53.1 *Administration dose not documented Date/Time Rate/Dose/Volume Action   05/14/23 1035 *Not included in total MAR Hold    Sedation Time  Sedation Time Physician-1: 49 minutes 1 second Contrast     Administrations occurring from 1021 to  1234 on 05/14/23:  Medication Name Total Dose  iohexol (OMNIPAQUE) 350 MG/ML injection 50 mL   Radiation/Fluoro  Fluoro time: 9.5 (min)  DAP: 13 (Gycm2) Cumulative Air Kerma: 113 (mGy) Coronary Findings  Diagnostic Dominance: Right Left Main  Vessel was injected. Vessel is normal in caliber. Vessel is angiographically normal.    Left Anterior Descending  The vessel exhibits minimal luminal irregularities.    Left Circumflex  Ost Cx to Prox Cx lesion is 30% stenosed.    Right Coronary Artery  Vessel was injected. Vessel is normal in caliber. Vessel is angiographically normal.    Intervention   No interventions have been documented.   Right Heart  Right Heart Pressures RHC Procedural Findings (on milrinone 0.25): Hemodynamics (mmHg) RA mean 14 RV 52/14 PA 50/20, mean 38 PCWP mean 28 with prominent v-waves to 40  Oxygen saturations: PA 63% AO 93%  Cardiac Output (Fick) 4.67  Cardiac Index (Fick) 3.13 PVR 2.1 WU  PAPi 2.1   Coronary Diagrams  Diagnostic Dominance: Right  Intervention   Implants   No implant documentation for this case.   Syngo Images   Show images for CARDIAC CATHETERIZATION Images on Long Term Storage   Show images for Asuka, Dusseau to Procedure Log  Procedure Log    Hemo Data  Flowsheet Row Most Recent Value  Fick Cardiac Output 4.67 L/min  Fick Cardiac Output Index 3.13 (L/min)/BSA  RA A Wave 16 mmHg  RA V Wave 17 mmHg  RA Mean 14 mmHg  RV Systolic Pressure 52 mmHg  RV Diastolic Pressure 8 mmHg  RV EDP 14 mmHg  PA Systolic Pressure 50 mmHg  PA Diastolic Pressure 20 mmHg  PA Mean 38 mmHg  PW A Wave 35 mmHg  PW V Wave 40 mmHg  PW Mean 28 mmHg  AO Systolic Pressure 118 mmHg  AO Diastolic Pressure 72 mmHg  AO Mean 93 mmHg  QP/QS 1  TPVR Index 12.13 HRUI  TSVR Index 29.68 HRUI  PVR SVR Ratio 0.13  TPVR/TSVR Ratio 0.41   TEE 05/15/23:   1. Left ventricular ejection fraction, by estimation, is <20%. The  left  ventricle has severely decreased function. The left ventricular internal  cavity size was moderately dilated.   2. Right ventricular systolic function is moderately reduced. The right  ventricular size is normal. There is normal pulmonary artery systolic  pressure. The estimated right ventricular systolic pressure is 28.4 mmHg.   3. Left atrial size was moderately dilated. No left atrial/left atrial  appendage thrombus was detected.   4. Right atrial size was mild to moderately dilated.   5. The mitral valve is normal in structure. Moderate mitral valve  regurgitation. No evidence of mitral stenosis.   6. The aortic valve is tricuspid. There is severe calcifcation of the  aortic valve. Aortic valve regurgitation is mild. Severe aortic valve  stenosis. Aortic valve area, by VTI measures 0.80 cm. Aortic valve mean  gradient measures 29.0 mmHg.   7. The inferior vena cava is normal in size with <50% respiratory  variability, suggesting right atrial pressure of 8 mmHg.   8. A small pericardial effusion is present.   FINDINGS   Left Ventricle: Left ventricular ejection fraction, by estimation, is  <20%. The left ventricle has severely decreased function. The left  ventricular internal cavity size was moderately dilated. There is no left  ventricular hypertrophy.   Right Ventricle: The right ventricular size is normal. No increase in  right ventricular wall thickness. Right ventricular systolic function is  moderately reduced. There is normal pulmonary artery systolic pressure.  The tricuspid regurgitant velocity is  2.26 m/s, and  with an assumed right atrial pressure of 8 mmHg, the  estimated right ventricular systolic pressure is 28.4 mmHg.   Left Atrium: Left atrial size was moderately dilated. No left atrial/left  atrial appendage thrombus was detected.   Right Atrium: Right atrial size was mild to moderately dilated.   Pericardium: A small pericardial effusion is present.    Mitral Valve: The mitral valve is normal in structure. Mild to moderate  mitral annular calcification. Moderate mitral valve regurgitation. No  evidence of mitral valve stenosis.   Tricuspid Valve: The tricuspid valve is normal in structure. Tricuspid  valve regurgitation is mild.   Aortic Valve: The aortic valve is tricuspid. There is severe calcifcation  of the aortic valve. Aortic valve regurgitation is mild. Severe aortic  stenosis is present. Aortic valve mean gradient measures 29.0 mmHg. Aortic  valve peak gradient measures 47.9  mmHg. Aortic valve area, by VTI measures 0.80 cm.   Pulmonic Valve: The pulmonic valve was normal in structure. Pulmonic valve  regurgitation is not visualized.   Aorta: The aortic root is normal in size and structure.   Venous: The inferior vena cava is normal in size with less than 50%  respiratory variability, suggesting right atrial pressure of 8 mmHg.   IAS/Shunts: No atrial level shunt detected by color flow Doppler.   Additional Comments: Spectral Doppler performed.   LEFT VENTRICLE  PLAX 2D  LVOT diam:     2.25 cm  LV SV:         58  LV SV Index:   39  LVOT Area:     3.98 cm     IVC  IVC diam: 1.70 cm   AORTIC VALVE  AV Area (Vmax):    0.85 cm  AV Area (Vmean):   0.85 cm  AV Area (VTI):     0.80 cm  AV Vmax:           346.00 cm/s  AV Vmean:          228.500 cm/s  AV VTI:            0.717 m  AV Peak Grad:      47.9 mmHg  AV Mean Grad:      29.0 mmHg  LVOT Vmax:         74.20 cm/s  LVOT Vmean:        48.600 cm/s  LVOT VTI:          0.145 m  LVOT/AV VTI ratio: 0.20   MR Peak grad:    102.0 mmHg   TRICUSPID VALVE  MR Mean grad:    63.0 mmHg    TR Peak grad:   20.4 mmHg  MR Vmax:         505.00 cm/s  TR Vmax:        226.00 cm/s  MR Vmean:        368.0 cm/s  MR PISA:         2.26 cm     SHUNTS  MR PISA Eff ROA: 17 mm       Systemic VTI:  0.14 m  MR PISA Radius:  0.60 cm      Systemic Diam: 2.25 cm   Recent  Labs: 06/20/2023: B Natriuretic Peptide >4,500.0; BUN 18; Creatinine, Ser 1.50; Hemoglobin 12.1; Platelets 262; Potassium 4.3; Sodium 139   Lipid Panel No results found for: "CHOL", "TRIG", "HDL", "CHOLHDL", "VLDL", "LDLCALC", "LDLDIRECT"   Wt Readings from Last 3 Encounters:  07/16/23 47.6  kg  06/20/23 51.3 kg  06/20/23 51.3 kg    Assessment and Plan:   1. 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 Her LV dysfunction could be related to her AS or her atrial fib. She is now followed in the AHF clinic. Difficulty adding GDMT due to soft BP. She is now on aldactone and tolerating well. Volume status is ok today on Lasix 40 mg po daily.   2. Severe Aortic stenosis:  She has severe low flow/low gradient aortic stenosis by recent TEE. She has NYHA class 2 symptoms. I have personally reviewed the echo images. The aortic valve is thickened and calcified with limited leaflet mobility. I think she would benefit from AVR. Given advanced age, she is not a good candidate for conventional AVR by surgical approach. I think she may be a candidate for TAVR.   I have reviewed the natural history of aortic stenosis with the patient and their family members  who are present today. We have discussed the limitations of medical therapy and the poor prognosis associated with symptomatic aortic stenosis. We have reviewed potential treatment options, including palliative medical therapy, conventional surgical aortic valve replacement, and transcatheter aortic valve replacement. We discussed treatment options in the context of the patient's specific comorbid medical conditions.   She would like to proceed with planning for TAVR. Risks and benefits of the valve procedure are reviewed with the patient. I will arrange cardiac CT, CTA of the chest/abdomen and pelvis and she will then be referred to see one of the CT surgeons on our TAVR team.   BMET today   3. Mitral regurgitation:  Moderate by recent TEE. Will follow  4. Atrial fibrillation, paroxysmal: She is in sinus today on exam. Continue amiodarone and Eliquis.   Labs/ tests ordeed today include:   Orders Placed This Encounter  Procedures   Basic Metabolic Panel (BMET)   Disposition:   F/U will be arranged with our structural heart team  Signed, Verne Carrow, MD 07/16/2023 3:04 PM    [facility] [address] [place], [place], [place]  [address] Phone: [phone]; Fax: [phone]

## 2023-07-16 NOTE — Progress Notes (Signed)
 Pre Surgical Assessment: 5 M Walk Test  64M=16.10ft  5 Meter Walk Test- trial 1: 4.36 seconds 5 Meter Walk Test- trial 2: 4.42 seconds 5 Meter Walk Test- trial 3: 4.49 seconds 5 Meter Walk Test Average: 4.42 seconds

## 2023-07-16 NOTE — Patient Instructions (Signed)
 Medication Instructions:  No changes *If you need a refill on your cardiac medications before your next appointment, please call your pharmacy*   Lab Work: Go to the first floor [facility] for blood work today (bmet)   Testing/Procedures: CT scans - see letter attached   Follow-Up: Per Risk analyst

## 2023-07-17 LAB — BASIC METABOLIC PANEL
BUN/Creatinine Ratio: 19 (ref 12–28)
BUN: 24 mg/dL (ref 8–27)
CO2: 26 mmol/L (ref 20–29)
Calcium: 9.1 mg/dL (ref 8.7–10.3)
Chloride: 102 mmol/L (ref 96–106)
Creatinine, Ser: 1.29 mg/dL — ABNORMAL HIGH (ref 0.57–1.00)
Glucose: 82 mg/dL (ref 70–99)
Potassium: 4.6 mmol/L (ref 3.5–5.2)
Sodium: 141 mmol/L (ref 134–144)
eGFR: 42 mL/min/{1.73_m2} — ABNORMAL LOW (ref >59)

## 2023-07-31 ENCOUNTER — Telehealth (HOSPITAL_COMMUNITY): Payer: Self-pay

## 2023-07-31 NOTE — Progress Notes (Incomplete)
 [facility]ED [facility] CLINIC CONSULT NOTE   Primary Care: Merri Brunette, MD Primary Cardiologist: Verne Carrow, MD HF Cardiologist: Dr. Gala Romney  HPI: Tara Hardin is an 83 y.o. female with history of persistent AF, breast cancer, aortic stenosis. LBBB and recently diagnosed systolic HF.   Echo 12/23 EF 50%. Mild MR. Mild to moderate TR. Moderate to severe AS with mean gradient 28 mmHg, AVA 0.94 cm2, SVI 47, DI 0.25.  Plan was to follow her moderate AS with repeat echo in one year.   Developed AF in 6/24 and was started on Eliquis. Developed HF symptoms 02/2023. Echo 04/05/23 EF < 20%. Severe enlargement of the left atrium. Mild to moderate MR. Moderately severe aortic stenosis with mean gradient 29 mmHg, AVA 0.82-0.88cm2, SVI 42, DI 0.32.   She saw Dr. Clifton James on 04/17/23 and volume status looked good on lasix 20 daily, but BP too low to start GDMT. She was referred to AHF clinic for further evaluation.  Seen in the AHF Clinic 05/10/23 to establish care. She was severely volume overloaded. Given overload with low BP and fragility, decision was made to admit for IV diuretics. She was diuresed and started on milrinone. She underwent R/LHC which showed mild coronary disease (30% Cx stenosis), elevated right and left filling pressures, pulmonary venous hypertension and good CO on milrinone 0.25. For her AF, she underwent TEE guided DCCV to NSR. She was maintained of low dose amiodarone, digoxin stopped. TEE confirmed low flow/low gradient aortic stenosis and moderate mitral regurgitation. GDMT was titrated and she was discharged to ALF, weight 104 lbs.  Today she returns for post hospital HF follow up. Overall feeling fine. She remains at ALF at [facility]. She has fatigue. She is not SOB walking on flat ground. Legs swell sometimes, takes Lasix daily. Denies syncope, palpitations, abnormal bleeding, CP, dizziness, or PND/Orthopnea. Appetite ok. No fever or chills. Taking all medications provided  by facility. Wants to be home and she misses her husband. Son and DIL live in [place].  Family history: Brother passed away, had a fib. Sister has an ICD. Sister passed away from CAD.    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     apixaban (ELIQUIS) 2.5 MG TABS tablet Take 1 tablet (2.5 mg total) by mouth 2 (two) times daily. 60 tablet 5   Biotin 1 MG CAPS Take 1 mg by mouth daily.     furosemide (LASIX) 20 MG tablet Take 2 tablets (40 mg total) by mouth daily. 60 tablet 11   letrozole (FEMARA) 2.5 MG tablet Take 1 tablet (2.5 mg total) by mouth daily. 90 tablet 3   Multiple Vitamin (MULTIVITAMIN) tablet Take 1 tablet by mouth daily.     Omega-3 Fatty Acids (FISH OIL) 1000 MG CAPS Take 1,000 mg by mouth daily.     potassium chloride SA (KLOR-CON M) 20 MEQ tablet Take 1 tablet as needed when taking lasix (Patient  not taking: Reported on 07/16/2023)     spironolactone (ALDACTONE) 25 MG tablet Take 0.5 tablets (12.5 mg total) by mouth daily. 45 tablet 3   No current facility-administered medications for this visit.    No Known Allergies    Social History   Socioeconomic History   Marital status: Married    Spouse name: Not on file   Number of children: 3   Years of education: Not on file   Highest education level: Not on file  Occupational History   Occupation: Retired Information systems manager  Tobacco Use   Smoking status: Never    Smokeless tobacco: Never   Tobacco comments:    Never smoked 06/20/23  Vaping Use   Vaping status: Never Used  Substance and Sexual Activity   Alcohol use: Not Currently   Drug use: Never   Sexual activity: Not Currently    Birth control/protection: Post-menopausal  Other Topics Concern   Not on file  Social History Narrative   Not on file   Social Drivers of Health   Financial Resource Strain: Not on file  Food Insecurity: No Food Insecurity (05/11/2023)   Hunger Vital Sign    Worried About Running Out of Food in the Last Year: Never true    Ran Out of Food in the Last Year: Never true  Transportation Needs: No Transportation Needs (05/11/2023)   PRAPARE - Administrator, Civil Service (Medical): No    Lack of Transportation (Non-Medical): No  Physical Activity: Not on file  Stress: Not on file  Social Connections: Moderately Isolated (05/14/2023)   Social Connection and Isolation Panel [NHANES]    Frequency of Communication with Friends and Family: More than three times a week    Frequency of Social Gatherings with Friends and Family: More than three times a week    Attends Religious Services: Never    Database administrator or Organizations: No    Attends Banker Meetings: Never    Marital Status: Married  Catering manager Violence: Not At Risk (05/11/2023)   Humiliation, Afraid, Rape, and Kick questionnaire    Fear of Current or Ex-Partner: No    Emotionally Abused: No    Physically Abused: No    Sexually Abused: No      Family History  Problem Relation Age of Onset   Lung cancer Mother        d. 3   Heart attack Sister    Hypertension Sister    Atrial fibrillation Brother    Hypertension Brother    Heart attack Brother    Irregular heart beat Brother    Breast cancer Paternal Aunt        dx after 37   Breast cancer Cousin        paternal female cousin   There were no vitals taken for this visit.  Wt Readings from Last 3  Encounters:  07/16/23 47.6 kg (105 lb)  06/20/23 51.3 kg (113 lb)  06/20/23 51.3 kg (113 lb)    PHYSICAL EXAM: General:  NAD. No resp difficulty, walked into clinic with RW, elderly HEENT: Normal Neck: Supple. JVP 10 Cor: Regular rate & rhythm. No rubs, gallops, 3/6 SEM RUSB Lungs: Clear Abdomen: Soft, nontender, nondistended.  Extremities: No cyanosis, clubbing, rash, 1-2+ BLE pre-tibial edema Neuro: Alert & oriented x 3, moves all 4 extremities w/o difficulty. Affect pleasant.  ECG (personally reviewed): appears AF vs junctional, rate controlled.  ReDs reading: 41%, abnormal  ASSESSMENT & PLAN:  1. Chronic systolic CHF:  - Echo in 12/23 with EF 50%, moderate-severe AS.   - Developed atrial fibrillation in 6/24 but rate has been controlled.  She has LBBB - Echo 11/24 shows EF 20-25% with septal-lateral dyssynchrony consistent with LBBB, mild RV dysfunction, possible low flow/low gradient severe AS, mild to mod MR - Cause of drop in EF is uncertain.  AS does not appear critical but think severe (see below).  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 She is volume overloaded on exam, ReDs elevated at 41% - Increase Lasix to 40 mg daily. - Start spironolactone 12.5 mg daily. - Hold off on SGLT2i with frailty. - No beta blocker yet - Off dig with recent bradycardia - Labs today, repeat BMET in 1 week  2. Atrial fibrillation: - Persistent since at least 6/24.  - She has a severely dilated LA, doubt she will hold NSR without anti-arrhythmic.  - s/p TEE-guided DCCV 12/24 to NSR - Per EP she is not a candidate for CRT-D with frailty, LV dilation and body mass.  - Off digoxin with  bradycardia - Continue amiodarone 200 mg daily. - Continue Eliquis 2.5 mg bid. No bleeding issues - ECG reviewed with Dr. Gala Romney, appears AF vs junctional - She has follow up with AF clinic today. - CBC today.  3. Aortic stenosis:  - Suspected  low flow/low gradient severe AS with mean gradient 34 mmHg and AVA 0.83.  TEE confirmed low flow/low gradient severe AS.  - Refer to Dr Excell Seltzer, Structural Heart Team for TAVR evaluation.  4. Mitral regurgitation:  - Moderate on TEE 12/24 - HF optimization as above  5. PVCs: - Occasionally on telemetry during admission - She denies palps, none on ECG today - Continue amiodarone  6. CKD IIIb:  - baseline Scr 1.2-1.4 - Follow renal function with addition of MRA - BMET today.  Follow up in 6 weeks with APP. I called her daughter-in-law, Tara Hardin, during today's visit and discussed findings and plan.  Tara Rome, FNP-BC 07/31/23

## 2023-07-31 NOTE — Telephone Encounter (Signed)
 Called to confirm/remind patient of their appointment at the [facility] on 08/01/23.   Appointment:   [x] Confirmed  [] Left mess   [] No answer/No voice mail  Patient reminded to bring all medications and/or complete list.  Confirmed patient has transportation. Gave directions, instructed to utilize valet parking.

## 2023-08-01 ENCOUNTER — Encounter (HOSPITAL_COMMUNITY): Payer: Self-pay

## 2023-08-01 ENCOUNTER — Ambulatory Visit (HOSPITAL_COMMUNITY)
Admission: RE | Admit: 2023-08-01 | Discharge: 2023-08-01 | Disposition: A | Discharge status: Home / Self Care | Payer: No Typology Code available for payment source | Source: Ambulatory Visit | Attending: Family Medicine | Admitting: Family Medicine

## 2023-08-01 VITALS — BP 94/64 | HR 81 | Wt 104.4 lb

## 2023-08-01 DIAGNOSIS — I447 Left bundle-branch block, unspecified: Secondary | ICD-10-CM | POA: Insufficient documentation

## 2023-08-01 DIAGNOSIS — I4819 Other persistent atrial fibrillation: Secondary | ICD-10-CM | POA: Diagnosis not present

## 2023-08-01 DIAGNOSIS — I5022 Chronic systolic (congestive) heart failure: Secondary | ICD-10-CM

## 2023-08-01 DIAGNOSIS — Z7901 Long term (current) use of anticoagulants: Secondary | ICD-10-CM | POA: Insufficient documentation

## 2023-08-01 DIAGNOSIS — N1832 Chronic kidney disease, stage 3b: Secondary | ICD-10-CM | POA: Insufficient documentation

## 2023-08-01 DIAGNOSIS — I493 Ventricular premature depolarization: Secondary | ICD-10-CM | POA: Diagnosis not present

## 2023-08-01 DIAGNOSIS — Z853 Personal history of malignant neoplasm of breast: Secondary | ICD-10-CM | POA: Insufficient documentation

## 2023-08-01 DIAGNOSIS — I340 Nonrheumatic mitral (valve) insufficiency: Secondary | ICD-10-CM

## 2023-08-01 DIAGNOSIS — I080 Rheumatic disorders of both mitral and aortic valves: Secondary | ICD-10-CM | POA: Diagnosis not present

## 2023-08-01 DIAGNOSIS — I350 Nonrheumatic aortic (valve) stenosis: Secondary | ICD-10-CM

## 2023-08-01 DIAGNOSIS — N183 Chronic kidney disease, stage 3 unspecified: Secondary | ICD-10-CM

## 2023-08-01 LAB — BASIC METABOLIC PANEL
Anion gap: 10 (ref 5–15)
BUN: 28 mg/dL — ABNORMAL HIGH (ref 8–23)
CO2: 24 mmol/L (ref 22–32)
Calcium: 8.6 mg/dL — ABNORMAL LOW (ref 8.9–10.3)
Chloride: 102 mmol/L (ref 98–111)
Creatinine, Ser: 1.33 mg/dL — ABNORMAL HIGH (ref 0.44–1.00)
GFR, Estimated: 40 mL/min — ABNORMAL LOW (ref >60)
Glucose, Bld: 110 mg/dL — ABNORMAL HIGH (ref 70–99)
Potassium: 5 mmol/L (ref 3.5–5.1)
Sodium: 136 mmol/L (ref 135–145)

## 2023-08-01 LAB — BRAIN NATRIURETIC PEPTIDE: B Natriuretic Peptide: 1915.3 pg/mL — ABNORMAL HIGH (ref 0.0–100.0)

## 2023-08-01 NOTE — Patient Instructions (Addendum)
 Medication Changes:  No medication changes today!  Lab Work:  Labs done today, your results will be available in MyChart, we will contact you for abnormal readings.   Follow-Up in: Please follow up with the [facility] in 2-3 months   At the [facility], you and your health needs are our priority. We have a designated team specialized in the treatment of Heart Failure. This Care Team includes your primary Heart Failure Specialized Cardiologist (physician), Advanced Practice Providers (APPs- Physician Assistants and Nurse Practitioners), and Pharmacist who all work together to provide you with the care you need, when you need it.   You may see any of the following providers on your designated Care Team at your next follow up:  Dr. Arvilla Meres Dr. Marca Ancona Dr. Dorthula Nettles Dr. Theresia Bough Tonye Becket, NP Robbie Lis, [place] [facility] [place], [place] Brynda Peon, NP [place] Lee, NP Karle Plumber, PharmD   Please be sure to bring in all your medications bottles to every appointment.   Need to Contact us:  If you have any questions or concerns before your next appointment please send [place] a message through [place] or call our office at [phone].    TO LEAVE A MESSAGE FOR THE NURSE SELECT OPTION 2, PLEASE LEAVE A MESSAGE INCLUDING: YOUR NAME DATE OF BIRTH CALL BACK NUMBER REASON FOR CALL**this is important as we prioritize the call backs  YOU WILL RECEIVE A CALL BACK THE SAME DAY AS LONG AS YOU CALL BEFORE 4:00 PM

## 2023-08-01 NOTE — Progress Notes (Signed)
 ReDS Vest / Clip - 08/01/23 1400       ReDS Vest / Clip   Station Marker A    Ruler Value 26    ReDS Value Range Low volume    ReDS Actual Value 33

## 2023-08-09 ENCOUNTER — Ambulatory Visit (HOSPITAL_COMMUNITY)
Admission: RE | Admit: 2023-08-09 | Discharge: 2023-08-09 | Disposition: A | Discharge status: Home / Self Care | Source: Ambulatory Visit | Attending: Cardiovascular Disease | Admitting: Cardiovascular Disease

## 2023-08-09 ENCOUNTER — Ambulatory Visit (HOSPITAL_BASED_OUTPATIENT_CLINIC_OR_DEPARTMENT_OTHER)
Admission: RE | Admit: 2023-08-09 | Discharge: 2023-08-09 | Discharge status: Home / Self Care | Source: Ambulatory Visit | Attending: Cardiovascular Disease | Admitting: Cardiovascular Disease

## 2023-08-09 DIAGNOSIS — I350 Nonrheumatic aortic (valve) stenosis: Secondary | ICD-10-CM | POA: Insufficient documentation

## 2023-08-09 MED ORDER — SODIUM CHLORIDE 0.9 % WEIGHT BASED INFUSION
3.0000 mL/kg/h | INTRAVENOUS | Status: AC
  Administered 2023-08-09: 3 mL/kg/h via INTRAVENOUS

## 2023-08-09 MED ORDER — IOHEXOL 350 MG/ML SOLN
95.0000 mL | Freq: Once | INTRAVENOUS | Status: AC | PRN
  Administered 2023-08-09: 95 mL via INTRAVENOUS

## 2023-08-09 MED ORDER — SODIUM CHLORIDE 0.9 % WEIGHT BASED INFUSION: 1.0000 mL/kg/h | INTRAVENOUS | Status: DC

## 2023-08-16 ENCOUNTER — Encounter: Admitting: Thoracic Surgery (Cardiothoracic Vascular Surgery)

## 2023-08-22 NOTE — Progress Notes (Unsigned)
 [address]       [place] [address]             [phone]           Jori Frerichs [facility] Medical Record #[id number] Date of Birth: 1940-07-15  Clayton Bibles, MD  Chief Complaint: AS    History of Present Illness:     Pt is an 83 yo female with a complicated cardiac history but has been followed for low flow low gradient AS. She has been seen over the past several years with AS and her EF had dramatically decreased from an EF of 50% in 2023 to less than 20% in a work up of increasing DOE in Dec 2024. She was noted to have significant MR also and was having issues with atrial fibrillation. She was admitted with GDMT and milrinone and cardioversion. Her TEE at taht time was with an EF of 20-25% and with a gradient of and a valve area of 0.80 cm2 and a DVI 0.2. She was improved on medical therapy and has been at an assisted living facility still complaining of DOE and fatigue but feels she has been getting stronger. She was reevaluated by Dr Sanjuana Kava this march and felt secondary to her AS and symptoms and calcified poorly moving valve to benefit from TAVR. She was declined for CRT therapy for frailty and size. She underwent cath without significant CAD and TAVR CTA with acceptable femoral access for a 23mm Sapien valve.      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   Nonrheumatic tricuspid (valve) insufficiency 07/21/2020   Prediabetes 08/30/2020    Past Surgical History:  Procedure Laterality Date   BREAST LUMPECTOMY WITH RADIOACTIVE SEED LOCALIZATION Left 08/11/2021   Procedure: LEFT BREAST  LUMPECTOMY WITH RADIOACTIVE SEED LOCALIZATION X2;  Surgeon: Griselda Miner, MD;  Location: [facility];  Service: General;  Laterality: Left;   CARDIOVERSION N/A 05/15/2023   Procedure: CARDIOVERSION;  Surgeon: Laurey Morale, MD;  Location: [facility] INVASIVE CV LAB;  Service: Cardiovascular;  Laterality: N/A;   RE-EXCISION OF BREAST CANCER,SUPERIOR MARGINS Left 10/07/2021   Procedure: RE-EXCISION OF LEFT BREAST INFERIOR MARGIN;  Surgeon: Griselda Miner, MD;  Location: [facility];  Service: General;  Laterality: Left;   RIGHT HEART CATH AND CORONARY ANGIOGRAPHY N/A 05/14/2023   Procedure: RIGHT HEART CATH AND CORONARY ANGIOGRAPHY;  Surgeon: Laurey Morale, MD;  Location[facility]VAS[facility] CV LAB;  Service: Cardiovascular;  Laterality: N/A;   TRANSESOPHAGEAL ECHOCARDIOGRAM (CATH LAB) N/A 05/15/2023   Procedure: TRANSESOPHAGEAL ECHOCARDIOGRAM;  Surgeon: Laurey Morale, MD;  Location: [facility] INVASIVE CV LAB;  Service: Cardiovascular;  Laterality: N/A;    Social History   Tobacco Use  Smoking Status Never  Smokeless Tobacco Never  Tobacco Comments   Never smoked 06/20/23    Social History   Substance and Sexual Activity  Alcohol Use Not Currently    Social History   Socioeconomic History   Marital status: Married    Spouse name: Not on file   Number of children: 3  Years of education: Not on file   Highest education level: Not on file  Occupational History   Occupation: Retired Information systems manager  Tobacco Use   Smoking status: Never   Smokeless tobacco: Never   Tobacco comments:    Never smoked 06/20/23  Vaping Use   Vaping status: Never Used  Substance and Sexual Activity   Alcohol use: Not Currently   Drug use: Never   Sexual activity: Not Currently    Birth control/protection: Post-menopausal  Other Topics Concern   Not on file  Social History Narrative   Not on file   Social Drivers of Health   Financial Resource Strain: Not on file  Food  Insecurity: No Food Insecurity (05/11/2023)   Hunger Vital Sign    Worried About Running Out of Food in the Last Year: Never true    Ran Out of Food in the Last Year: Never true  Transportation Needs: No Transportation Needs (05/11/2023)   PRAPARE - Administrator, Civil Service (Medical): No    Lack of Transportation (Non-Medical): No  Physical Activity: Not on file  Stress: Not on file  Social Connections: Moderately Isolated (05/14/2023)   Social Connection and Isolation Panel [NHANES]    Frequency of Communication with Friends and Family: More than three times a week    Frequency of Social Gatherings with Friends and Family: More than three times a week    Attends Religious Services: Never    Database administrator or Organizations: No    Attends Banker Meetings: Never    Marital Status: Married  Catering manager Violence: Not At Risk (05/11/2023)   Humiliation, Afraid, Rape, and Kick questionnaire    Fear of Current or Ex-Partner: No    Emotionally Abused: No    Physically Abused: No    Sexually Abused: No    No Known Allergies  Current Outpatient Medications  Medication Sig Dispense Refill   amiodarone (PACERONE) 200 MG tablet Take 1 tablet (200 mg total) by mouth daily.     apixaban (ELIQUIS) 2.5 MG TABS tablet Take 1 tablet (2.5 mg total) by mouth 2 (two) times daily. 60 tablet 5   Biotin 1 MG CAPS Take 1 mg by mouth daily.     furosemide (LASIX) 20 MG tablet Take 2 tablets (40 mg total) by mouth daily. (Patient taking differently: Take 20 mg by mouth daily.) 60 tablet 11   letrozole (FEMARA) 2.5 MG tablet Take 1 tablet (2.5 mg total) by mouth daily. 90 tablet 3   Multiple Vitamin (MULTIVITAMIN) tablet Take 1 tablet by mouth daily.     Omega-3 Fatty Acids (FISH OIL) 1000 MG CAPS Take 1,000 mg by mouth daily.     spironolactone (ALDACTONE) 25 MG tablet Take 0.5 tablets (12.5 mg total) by mouth daily. 45 tablet 3   No current facility-administered  medications for this visit.     Family History  Problem Relation Age of Onset   Lung cancer Mother        d. 51   Heart attack Sister    Hypertension Sister    Atrial fibrillation Brother    Hypertension Brother    Heart attack Brother    Irregular heart beat Brother    Breast cancer Paternal Aunt        dx after 19   Breast cancer Cousin        paternal female cousin       Physical Exam: Frail Lungs: Clear Card: RR  with 3/6 sem Ext: no edema Neuro; intact     Diagnostic Studies & Laboratory data: I have personally reviewed the following studies and agree with the findings   TTE (03/2023) IMPRESSIONS     1. Spontaneoous echo contrast noted in LV.Marland Kitchen Left ventricular ejection  fraction, by estimation, is <20%. The left ventricle has severely  decreased function. The left ventricle has no regional wall motion  abnormalities. Left ventricular diastolic  parameters are indeterminate.   2. Right ventricular systolic function is normal. The right ventricular  size is normal.   3. Left atrial size was severely dilated.   4. Right atrial size was moderately dilated.   5. The mitral valve is abnormal. Severe mitral valve regurgitation. No  evidence of mitral stenosis.   6. Severely depressed systolic and degree of MR may underestimate AS.  Clinical correlation suggested.. The aortic valve was not well visualized.  Aortic valve regurgitation is mild. Moderate aortic valve stenosis.   7. The inferior vena cava is normal in size with greater than 50%  respiratory variability, suggesting right atrial pressure of 3 mmHg.   Comparison(s): Echocardiogram done 05/09/22 showed an EF of 45-50% with  moderate to severe AS and an AV Mean Grad of 28.7 mmHg.   FINDINGS   Left Ventricle: Spontaneoous echo contrast noted in LV. Left ventricular  ejection fraction, by estimation, is <20%. The left ventricle has severely  decreased function. The left ventricle has no regional wall  motion  abnormalities. The left ventricular  internal cavity size was normal in size. There is no left ventricular  hypertrophy. Left ventricular diastolic parameters are indeterminate.   Right Ventricle: The right ventricular size is normal. No increase in  right ventricular wall thickness. Right ventricular systolic function is  normal.   Left Atrium: Left atrial size was severely dilated.   Right Atrium: Right atrial size was moderately dilated.   Pericardium: Trivial pericardial effusion is present. The pericardial  effusion is circumferential.   Mitral Valve: The mitral valve is abnormal. Severe mitral valve  regurgitation. No evidence of mitral valve stenosis.   Tricuspid Valve: The tricuspid valve is normal in structure. Tricuspid  valve regurgitation is not demonstrated. No evidence of tricuspid  stenosis.   Aortic Valve: Severely depressed systolic and degree of MR may  underestimate AS. Clinical correlation suggested. The aortic valve was not  well visualized. Aortic valve regurgitation is mild. Aortic regurgitation  PHT measures 519 msec. Moderate aortic  stenosis is present. Aortic valve mean gradient measures 29.8 mmHg. Aortic  valve peak gradient measures 45.3 mmHg. Aortic valve area, by VTI measures  0.83 cm.   Pulmonic Valve: The pulmonic valve was normal in structure. Pulmonic valve  regurgitation is not visualized. No evidence of pulmonic stenosis.   Aorta: The aortic root is normal in size and structure.   Venous: The inferior vena cava is normal in size with greater than 50%  respiratory variability, suggesting right atrial pressure of 3 mmHg.   IAS/Shunts: No atrial level shunt detected by color flow Doppler.     LEFT VENTRICLE  PLAX 2D  LVIDd:         5.80 cm      Diastology  LVIDs:         4.90 cm      LV e' medial:    2.75 cm/s  LV PW:         1.30 cm      LV E/e' medial:  38.8  LV  IVS:        0.90 cm      LV e' lateral:   4.40 cm/s  LVOT diam:      1.80 cm      LV E/e' lateral: 24.2  LV SV:         64  LV SV Index:   42  LVOT Area:     2.54 cm    LV Volumes (MOD)  LV vol d, MOD A2C: 107.0 ml  LV vol d, MOD A4C: 149.5 ml  LV vol s, MOD A2C: 73.1 ml  LV vol s, MOD A4C: 111.5 ml  LV SV MOD A2C:     33.9 ml  LV SV MOD A4C:     149.5 ml  LV SV MOD BP:      38.0 ml   RIGHT VENTRICLE  RV S prime:     7.67 cm/s  TAPSE (M-mode): 0.6 cm   LEFT ATRIUM              Index        RIGHT ATRIUM           Index  LA diam:        4.10 cm  2.68 cm/m   RA Area:     26.50 cm  LA Vol (A2C):   106.0 ml 69.27 ml/m  RA Volume:   95.80 ml  62.60 ml/m  LA Vol (A4C):   138.0 ml 90.18 ml/m  LA Biplane Vol: 123.0 ml 80.38 ml/m   AORTIC VALVE  AV Area (Vmax):    0.88 cm  AV Area (Vmean):   0.82 cm  AV Area (VTI):     0.83 cm  AV Vmax:           336.40 cm/s  AV Vmean:          256.750 cm/s  AV VTI:            0.770 m  AV Peak Grad:      45.3 mmHg  AV Mean Grad:      29.8 mmHg  LVOT Vmax:         116.63 cm/s  LVOT Vmean:        82.800 cm/s  LVOT VTI:          0.250 m  LVOT/AV VTI ratio: 0.32  AI PHT:            519 msec    AORTA  Ao Root diam: 3.20 cm   MITRAL VALVE                    TRICUSPID VALVE  MV Area (PHT): 5.48 cm         TR Peak grad:   37.5 mmHg  MV Decel Time: 138 msec         TR Vmax:        306.00 cm/s  MR Peak grad:      87.0 mmHg  MR Mean grad:      49.5 mmHg    SHUNTS  MR Vmax:           466.33 cm/s  Systemic VTI:  0.25 m  MR Vmean:          330.0 cm/s   Systemic Diam: 1.80 cm  MR Vena Contracta: 0.20 cm  MR PISA:           1.01 cm  MR PISA Eff ROA:   5 mm  MR PISA  Radius:    0.40 cm  MV E velocity: 106.67 cm/s   TEE (04/2023) IMPRESSIONS     1. Left ventricular ejection fraction, by estimation, is <20%. The left  ventricle has severely decreased function. The left ventricular internal  cavity size was moderately dilated.   2. Right ventricular systolic function is moderately reduced. The right   ventricular size is normal. There is normal pulmonary artery systolic  pressure. The estimated right ventricular systolic pressure is 28.4 mmHg.   3. Left atrial size was moderately dilated. No left atrial/left atrial  appendage thrombus was detected.   4. Right atrial size was mild to moderately dilated.   5. The mitral valve is normal in structure. Moderate mitral valve  regurgitation. No evidence of mitral stenosis.   6. The aortic valve is tricuspid. There is severe calcifcation of the  aortic valve. Aortic valve regurgitation is mild. Severe aortic valve  stenosis. Aortic valve area, by VTI measures 0.80 cm. Aortic valve mean  gradient measures 29.0 mmHg.   7. The inferior vena cava is normal in size with <50% respiratory  variability, suggesting right atrial pressure of 8 mmHg.   8. A small pericardial effusion is present.   FINDINGS   Left Ventricle: Left ventricular ejection fraction, by estimation, is  <20%. The left ventricle has severely decreased function. The left  ventricular internal cavity size was moderately dilated. There is no left  ventricular hypertrophy.   Right Ventricle: The right ventricular size is normal. No increase in  right ventricular wall thickness. Right ventricular systolic function is  moderately reduced. There is normal pulmonary artery systolic pressure.  The tricuspid regurgitant velocity is  2.26 m/s, and with an assumed right atrial pressure of 8 mmHg, the  estimated right ventricular systolic pressure is 28.4 mmHg.   Left Atrium: Left atrial size was moderately dilated. No left atrial/left  atrial appendage thrombus was detected.   Right Atrium: Right atrial size was mild to moderately dilated.   Pericardium: A small pericardial effusion is present.   Mitral Valve: The mitral valve is normal in structure. Mild to moderate  mitral annular calcification. Moderate mitral valve regurgitation. No  evidence of mitral valve stenosis.    Tricuspid Valve: The tricuspid valve is normal in structure. Tricuspid  valve regurgitation is mild.   Aortic Valve: The aortic valve is tricuspid. There is severe calcifcation  of the aortic valve. Aortic valve regurgitation is mild. Severe aortic  stenosis is present. Aortic valve mean gradient measures 29.0 mmHg. Aortic  valve peak gradient measures 47.9  mmHg. Aortic valve area, by VTI measures 0.80 cm.   Pulmonic Valve: The pulmonic valve was normal in structure. Pulmonic valve  regurgitation is not visualized.   Aorta: The aortic root is normal in size and structure.   Venous: The inferior vena cava is normal in size with less than 50%  respiratory variability, suggesting right atrial pressure of 8 mmHg.   IAS/Shunts: No atrial level shunt detected by color flow Doppler.   Additional Comments: Spectral Doppler performed.   LEFT VENTRICLE  PLAX 2D  LVOT diam:     2.25 cm  LV SV:         58  LV SV Index:   39  LVOT Area:     3.98 cm     IVC  IVC diam: 1.70 cm   AORTIC VALVE  AV Area (Vmax):    0.85 cm  AV Area (Vmean):   0.85 cm  AV  Area (VTI):     0.80 cm  AV Vmax:           346.00 cm/s  AV Vmean:          228.500 cm/s  AV VTI:            0.717 m  AV Peak Grad:      47.9 mmHg  AV Mean Grad:      29.0 mmHg  LVOT Vmax:         74.20 cm/s  LVOT Vmean:        48.600 cm/s  LVOT VTI:          0.145 m  LVOT/AV VTI ratio: 0.20   MR Peak grad:    102.0 mmHg   TRICUSPID VALVE  MR Mean grad:    63.0 mmHg    TR Peak grad:   20.4 mmHg  MR Vmax:         505.00 cm/s  TR Vmax:        226.00 cm/s  MR Vmean:        368.0 cm/s  MR PISA:         2.26 cm     SHUNTS  MR PISA Eff ROA: 17 mm       Systemic VTI:  0.14 m  MR PISA Radius:  0.60 cm      Systemic Diam: 2.25 cm   CATH (04/2023) Conclusion  1. Nonobstructive mild coronary disease. 2. LV and RV filling pressures remain elevated.  3. Pulmonary venous hypertension.  4. Good cardiac output on milrinone 0.25.      Recent Radiology Findings:   CTA (08/2023) FINDINGS: Aortic Root:   Aortic valve: Trileaflet   Aortic valve calcium score: 2318   Aortic annulus:   Diameter: 27mm x 18mm   Perimeter: 72mm   Area: 377mm^2   Calcifications: No calcifications   Coronary height: Min Left - 15mm ; Min Right - 13mm   Sinotubular height: Left cusp - 24mm; Right cusp - 20mm; Noncoronary cusp - 24mm   LVOT (as measured 3 mm below the annulus):   Diameter: 28mm x 19mm   Area: 44mm^2   Calcifications: No calcifications   Aortic sinus width: Left cusp - 36mm; Right cusp - 31mm; Noncoronary cusp - 33mm   Sinotubular junction width: 29mm x 28mm   Optimum Fluoroscopic Angle for Delivery: LAO 14 CAU 16   Cardiac:   Right atrium: Moderate enlargement   Right ventricle: Moderate enlargement   Pulmonary arteries: Dilated main pulmonary artery measuring 31mm   Pulmonary veins: Normal configuration   Left atrium: Severe enlargement   Left ventricle: Severe enlargement   Pericardium: Normal thickness   Coronary arteries: Normal origins. Coronary calcium score 221 (62nd percentile)   IMPRESSION: 1. Tricuspid aortic valve with severe calcifications (AV calcium score 2318)   2. Aortic annulus measures 27mm x 18mm in diameter with perimeter 72mm and area 360mm^2. No annular or LVOT calcifications. Annular measurements are suitable for delivery of a 23mm Edwards Sapien 3 valve   3.  Sufficient coronary to annulus distance   4.  Optimum Fluoroscopic Angle for Delivery:  LAO 14 CAU 16   5.  Dilated main pulmonary artery measuring 31mm   6.  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 She is a candidate for AVR however with her age and overall frailty and poor EF would only be with TAVR. She is not a bail out candidate. She understands all the risks and goals and recovery from TAVR and she wishes to proceed.   I have spent 60 min in review of the records, viewing studies and in face to face with patient and in coordination of future care    Eugenio Hoes 08/22/2023 3:58 PM

## 2023-08-23 ENCOUNTER — Institutional Professional Consult (permissible substitution): Admitting: Thoracic Surgery (Cardiothoracic Vascular Surgery)

## 2023-08-23 ENCOUNTER — Encounter: Payer: Self-pay | Admitting: Thoracic Surgery (Cardiothoracic Vascular Surgery)

## 2023-08-23 VITALS — BP 107/61 | HR 75 | Resp 20 | Ht 64.0 in | Wt 104.0 lb

## 2023-08-23 DIAGNOSIS — I350 Nonrheumatic aortic (valve) stenosis: Secondary | ICD-10-CM | POA: Diagnosis not present

## 2023-08-23 NOTE — Patient Instructions (Signed)
 TAVR

## 2023-08-28 ENCOUNTER — Other Ambulatory Visit: Payer: Self-pay

## 2023-08-28 DIAGNOSIS — I350 Nonrheumatic aortic (valve) stenosis: Secondary | ICD-10-CM

## 2023-09-07 ENCOUNTER — Encounter (HOSPITAL_COMMUNITY)
Admission: RE | Admit: 2023-09-07 | Discharge: 2023-09-07 | Disposition: A | Discharge status: Home / Self Care | Source: Ambulatory Visit | Attending: Cardiovascular Disease | Admitting: Cardiovascular Disease

## 2023-09-07 ENCOUNTER — Other Ambulatory Visit: Payer: Self-pay

## 2023-09-07 ENCOUNTER — Ambulatory Visit (HOSPITAL_COMMUNITY)
Admission: RE | Admit: 2023-09-07 | Discharge: 2023-09-07 | Disposition: A | Discharge status: Home / Self Care | Source: Ambulatory Visit | Attending: Cardiovascular Disease | Admitting: Cardiovascular Disease

## 2023-09-07 DIAGNOSIS — Z01818 Encounter for other preprocedural examination: Secondary | ICD-10-CM | POA: Diagnosis present

## 2023-09-07 DIAGNOSIS — I350 Nonrheumatic aortic (valve) stenosis: Secondary | ICD-10-CM | POA: Insufficient documentation

## 2023-09-07 LAB — URINALYSIS, ROUTINE W REFLEX MICROSCOPIC
Bilirubin Urine: NEGATIVE
Glucose, UA: NEGATIVE mg/dL
Ketones, ur: NEGATIVE mg/dL
Nitrite: NEGATIVE
Protein, ur: NEGATIVE mg/dL
RBC / HPF: >50 RBC/hpf (ref 0–5)
Specific Gravity, Urine: 1.012 (ref 1.005–1.030)
pH: 6 (ref 5.0–8.0)

## 2023-09-07 LAB — COMPREHENSIVE METABOLIC PANEL WITH GFR
ALT: 31 U/L (ref 0–44)
AST: 40 U/L (ref 15–41)
Albumin: 3.7 g/dL (ref 3.5–5.0)
Alkaline Phosphatase: 68 U/L (ref 38–126)
Anion gap: 8 (ref 5–15)
BUN: 20 mg/dL (ref 8–23)
CO2: 27 mmol/L (ref 22–32)
Calcium: 9 mg/dL (ref 8.9–10.3)
Chloride: 104 mmol/L (ref 98–111)
Creatinine, Ser: 1.33 mg/dL — ABNORMAL HIGH (ref 0.44–1.00)
GFR, Estimated: 40 mL/min — ABNORMAL LOW (ref >60)
Glucose, Bld: 74 mg/dL (ref 70–99)
Potassium: 4 mmol/L (ref 3.5–5.1)
Sodium: 139 mmol/L (ref 135–145)
Total Bilirubin: 1.1 mg/dL (ref 0.0–1.2)
Total Protein: 6.9 g/dL (ref 6.5–8.1)

## 2023-09-07 LAB — TYPE AND SCREEN
ABO/RH(D): O POS
Antibody Screen: NEGATIVE

## 2023-09-07 LAB — CBC
HCT: 37.1 % (ref 36.0–46.0)
Hemoglobin: 12 g/dL (ref 12.0–15.0)
MCH: 30.9 pg (ref 26.0–34.0)
MCHC: 32.3 g/dL (ref 30.0–36.0)
MCV: 95.6 fL (ref 80.0–100.0)
Platelets: 235 10*3/uL (ref 150–400)
RBC: 3.88 MIL/uL (ref 3.87–5.11)
RDW: 17.7 % — ABNORMAL HIGH (ref 11.5–15.5)
WBC: 8.8 10*3/uL (ref 4.0–10.5)
nRBC: 0 % (ref 0.0–0.2)

## 2023-09-07 LAB — PROTIME-INR
INR: 1 (ref 0.8–1.2)
Prothrombin Time: 13.5 s (ref 11.4–15.2)

## 2023-09-07 LAB — SURGICAL PCR SCREEN
MRSA, PCR: NEGATIVE
Staphylococcus aureus: NEGATIVE

## 2023-09-07 NOTE — Progress Notes (Signed)
 STS 8.5%

## 2023-09-07 NOTE — Progress Notes (Addendum)
 Patient signed all consents at PAT lab appointment. CHG soap and instructions were given to patient. CHG surgical prep reviewed with patient and all questions answered.  Patients chart send to anesthesia for review. Berwyn Broker, RN with TAVR team make aware of pts runny nose, mild headache and intermittent upset stomach.   Pt given note to remind [facility] to fax her medication list prior to surgery. Pharmacy call center fax number and phone number given to pt.

## 2023-09-10 ENCOUNTER — Other Ambulatory Visit (HOSPITAL_COMMUNITY): Payer: Self-pay

## 2023-09-10 ENCOUNTER — Encounter: Payer: Self-pay | Admitting: Hematology and Oncology

## 2023-09-10 MED ORDER — DEXMEDETOMIDINE HCL IN NACL 400 MCG/100ML IV SOLN
0.1000 ug/kg/h | INTRAVENOUS | Status: AC
  Administered 2023-09-11: 23.6 ug via INTRAVENOUS
  Administered 2023-09-11: 1 ug/kg/h via INTRAVENOUS
  Administered 2023-09-11: 11.8 ug via INTRAVENOUS
  Filled 2023-09-10: qty 100

## 2023-09-10 MED ORDER — NOREPINEPHRINE 4 MG/250ML-% IV SOLN
0.0000 ug/min | INTRAVENOUS | Status: AC
  Administered 2023-09-11: 1 ug/min via INTRAVENOUS
  Filled 2023-09-10: qty 250

## 2023-09-10 MED ORDER — CEFAZOLIN SODIUM-DEXTROSE 2-4 GM/100ML-% IV SOLN
2.0000 g | INTRAVENOUS | Status: AC
  Administered 2023-09-11: 2 g via INTRAVENOUS
  Filled 2023-09-10: qty 100

## 2023-09-10 MED ORDER — MAGNESIUM SULFATE 50 % IJ SOLN
40.0000 meq | INTRAMUSCULAR | Status: DC
  Filled 2023-09-10: qty 9.85

## 2023-09-10 MED ORDER — POTASSIUM CHLORIDE 2 MEQ/ML IV SOLN
80.0000 meq | INTRAVENOUS | Status: DC
  Filled 2023-09-10: qty 40

## 2023-09-10 MED ORDER — HEPARIN 30,000 UNITS/1000 ML (OHS) CELLSAVER SOLUTION
Status: DC
  Filled 2023-09-10: qty 1000

## 2023-09-10 NOTE — H&P (Signed)
 [address]       [place] [address]             [phone]      Cardiothoracic Surgery Admission History and Physical   Tara Hardin [facility] Medical Record #[id number] Date of Birth: 09/19/1940   Tara Stanford, MD   Chief Complaint: AS     History of Present Illness:      Pt is an 83 yo female with a complicated cardiac history but has been followed for low flow low gradient AS. She has been seen over the past several years with AS and her EF had dramatically decreased from an EF of 50% in 2023 to less than 20% in a work up of increasing DOE in Dec 2024. She was noted to have significant MR also and was having issues with atrial fibrillation. She was admitted with GDMT and milrinone  and cardioversion. Her TEE at taht time was with an EF of 20-25% and with a gradient of and a valve area of 0.80 cm2 and a DVI 0.2. She was improved on medical therapy and has been at an assisted living facility still complaining of DOE and fatigue but feels she has been getting stronger. She was reevaluated by Dr Lauris Port this march and felt secondary to her AS and symptoms and calcified poorly moving valve to benefit from TAVR. She was declined for CRT therapy for frailty and size. She underwent cath without significant CAD and TAVR CTA with acceptable femoral access for a 23mm Sapien valve.             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   Nonrheumatic tricuspid (valve) insufficiency 07/21/2020   Prediabetes 08/30/2020               Past Surgical History:  Procedure Laterality Date   BREAST  LUMPECTOMY WITH RADIOACTIVE SEED LOCALIZATION Left 08/11/2021    Procedure: LEFT BREAST LUMPECTOMY WITH RADIOACTIVE SEED LOCALIZATION X2;  Surgeon: Caralyn Chandler, MD;  Location: [facility];  Service: General;  Laterality: Left;   CARDIOVERSION N/A 05/15/2023    Procedure: CARDIOVERSION;  Surgeon: Darlis Eisenmenger, MD;  Location: [facility] INVASIVE CV LAB;  Service: Cardiovascular;  Laterality: N/A;   RE-EXCISION OF BREAST CANCER,SUPERIOR MARGINS Left 10/07/2021    Procedure: RE-EXCISION OF LEFT BREAST INFERIOR MARGIN;  Surgeon: Caralyn Chandler, MD;  Location:  SURGERY CENTER;  Service: General;  Laterality: Left;   RIGHT HEART CATH AND CORONARY ANGIOGRAPHY N/A 05/14/2023    Procedure: RIGHT HEART CATH AND CORONARY ANGIOGRAPHY;  Surgeon: Darlis Eisenmenger, MD;  Location: [facility] INVASIVE CV LAB;  Service: Cardiovascular;  Laterality: N/A;   TRANSESOPHAGEAL ECHOCARDIOGRAM (CATH LAB) N/A 05/15/2023    Procedure: TRANSESOPHAGEAL ECHOCARDIOGRAM;  Surgeon: Darlis Eisenmenger, MD;  Location: [facility] INVASIVE CV LAB;  Service: Cardiovascular;  Laterality: N/A;          Tobacco Use History  Social History        Tobacco Use  Smoking Status Never  Smokeless Tobacco Never  Tobacco Comments  Never smoked 06/20/23      Social History       Substance and Sexual Activity  Alcohol Use Not Currently      Social History         Socioeconomic History   Marital status: Married      Spouse name: Not on file   Number of children: 3   Years of education: Not on file   Highest education level: Not on file  Occupational History   Occupation: Retired Information systems manager  Tobacco Use   Smoking status: Never   Smokeless tobacco: Never   Tobacco comments:      Never smoked 06/20/23  Vaping Use   Vaping status: Never Used  Substance and Sexual Activity   Alcohol use: Not Currently   Drug use: Never   Sexual activity: Not Currently      Birth control/protection: Post-menopausal  Other Topics  Concern   Not on file  Social History Narrative   Not on file    Social Drivers of Health        Financial Resource Strain: Not on file  Food Insecurity: No Food Insecurity (05/11/2023)    Hunger Vital Sign     Worried About Running Out of Food in the Last Year: Never true     Ran Out of Food in the Last Year: Never true  Transportation Needs: No Transportation Needs (05/11/2023)    PRAPARE - Therapist, art (Medical): No     Lack of Transportation (Non-Medical): No  Physical Activity: Not on file  Stress: Not on file  Social Connections: Moderately Isolated (05/14/2023)    Social Connection and Isolation Panel [NHANES]     Frequency of Communication with Friends and Family: More than three times a week     Frequency of Social Gatherings with Friends and Family: More than three times a week     Attends Religious Services: Never     Database administrator or Organizations: No     Attends Banker Meetings: Never     Marital Status: Married  Catering manager Violence: Not At Risk (05/11/2023)    Humiliation, Afraid, Rape, and Kick questionnaire     Fear of Current or Ex-Partner: No     Emotionally Abused: No     Physically Abused: No     Sexually Abused: No      Allergies  No Known Allergies           Current Outpatient Medications  Medication Sig Dispense Refill   amiodarone  (PACERONE ) 200 MG tablet Take 1 tablet (200 mg total) by mouth daily.       apixaban  (ELIQUIS ) 2.5 MG TABS tablet Take 1 tablet (2.5 mg total) by mouth 2 (two) times daily. 60 tablet 5   Biotin 1 MG CAPS Take 1 mg by mouth daily.       furosemide  (LASIX ) 20 MG tablet Take 2 tablets (40 mg total) by mouth daily. (Patient taking differently: Take 20 mg by mouth daily.) 60 tablet 11   letrozole  (FEMARA ) 2.5 MG tablet Take 1 tablet (2.5 mg total) by mouth daily. 90 tablet 3   Multiple Vitamin (MULTIVITAMIN) tablet Take 1 tablet by mouth daily.       Omega-3 Fatty  Acids (FISH OIL) 1000 MG CAPS Take 1,000 mg by mouth daily.       spironolactone  (ALDACTONE ) 25 MG tablet Take 0.5 tablets (12.5 mg total) by mouth daily. 45 tablet 3  No current facility-administered medications for this visit.               Family History  Problem Relation Age of Onset   Lung cancer Mother          d. 40   Heart attack Sister     Hypertension Sister     Atrial fibrillation Brother     Hypertension Brother     Heart attack Brother     Irregular heart beat Brother     Breast cancer Paternal Aunt          dx after 23   Breast cancer Cousin          paternal female cousin                Physical Exam: Frail Lungs: Clear Card: RR with 3/6 sem Ext: no edema Neuro; intact         Diagnostic Studies & Laboratory data: I have personally reviewed the following studies and agree with the findings   TTE (03/2023) IMPRESSIONS     1. Spontaneoous echo contrast noted in LV.Aaron Aas Left ventricular ejection  fraction, by estimation, is <20%. The left ventricle has severely  decreased function. The left ventricle has no regional wall motion  abnormalities. Left ventricular diastolic  parameters are indeterminate.   2. Right ventricular systolic function is normal. The right ventricular  size is normal.   3. Left atrial size was severely dilated.   4. Right atrial size was moderately dilated.   5. The mitral valve is abnormal. Severe mitral valve regurgitation. No  evidence of mitral stenosis.   6. Severely depressed systolic and degree of MR may underestimate AS.  Clinical correlation suggested.. The aortic valve was not well visualized.  Aortic valve regurgitation is mild. Moderate aortic valve stenosis.   7. The inferior vena cava is normal in size with greater than 50%  respiratory variability, suggesting right atrial pressure of 3 mmHg.   Comparison(s): Echocardiogram done 05/09/22 showed an EF of 45-50% with  moderate to severe AS and an AV Mean  Grad of 28.7 mmHg.   FINDINGS   Left Ventricle: Spontaneoous echo contrast noted in LV. Left ventricular  ejection fraction, by estimation, is <20%. The left ventricle has severely  decreased function. The left ventricle has no regional wall motion  abnormalities. The left ventricular  internal cavity size was normal in size. There is no left ventricular  hypertrophy. Left ventricular diastolic parameters are indeterminate.   Right Ventricle: The right ventricular size is normal. No increase in  right ventricular wall thickness. Right ventricular systolic function is  normal.   Left Atrium: Left atrial size was severely dilated.   Right Atrium: Right atrial size was moderately dilated.   Pericardium: Trivial pericardial effusion is present. The pericardial  effusion is circumferential.   Mitral Valve: The mitral valve is abnormal. Severe mitral valve  regurgitation. No evidence of mitral valve stenosis.   Tricuspid Valve: The tricuspid valve is normal in structure. Tricuspid  valve regurgitation is not demonstrated. No evidence of tricuspid  stenosis.   Aortic Valve: Severely depressed systolic and degree of MR may  underestimate AS. Clinical correlation suggested. The aortic valve was not  well visualized. Aortic valve regurgitation is mild. Aortic regurgitation  PHT measures 519 msec. Moderate aortic  stenosis is present. Aortic valve mean gradient measures 29.8 mmHg. Aortic  valve peak gradient measures 45.3 mmHg. Aortic valve area, by VTI measures  0.83 cm.  Pulmonic Valve: The pulmonic valve was normal in structure. Pulmonic valve  regurgitation is not visualized. No evidence of pulmonic stenosis.   Aorta: The aortic root is normal in size and structure.   Venous: The inferior vena cava is normal in size with greater than 50%  respiratory variability, suggesting right atrial pressure of 3 mmHg.   IAS/Shunts: No atrial level shunt detected by color flow Doppler.      LEFT VENTRICLE  PLAX 2D  LVIDd:         5.80 cm      Diastology  LVIDs:         4.90 cm      LV e' medial:    2.75 cm/s  LV PW:         1.30 cm      LV E/e' medial:  38.8  LV IVS:        0.90 cm      LV e' lateral:   4.40 cm/s  LVOT diam:     1.80 cm      LV E/e' lateral: 24.2  LV SV:         64  LV SV Index:   42  LVOT Area:     2.54 cm    LV Volumes (MOD)  LV vol d, MOD A2C: 107.0 ml  LV vol d, MOD A4C: 149.5 ml  LV vol s, MOD A2C: 73.1 ml  LV vol s, MOD A4C: 111.5 ml  LV SV MOD A2C:     33.9 ml  LV SV MOD A4C:     149.5 ml  LV SV MOD BP:      38.0 ml   RIGHT VENTRICLE  RV S prime:     7.67 cm/s  TAPSE (M-mode): 0.6 cm   LEFT ATRIUM              Index        RIGHT ATRIUM           Index  LA diam:        4.10 cm  2.68 cm/m   RA Area:     26.50 cm  LA Vol (A2C):   106.0 ml 69.27 ml/m  RA Volume:   95.80 ml  62.60 ml/m  LA Vol (A4C):   138.0 ml 90.18 ml/m  LA Biplane Vol: 123.0 ml 80.38 ml/m   AORTIC VALVE  AV Area (Vmax):    0.88 cm  AV Area (Vmean):   0.82 cm  AV Area (VTI):     0.83 cm  AV Vmax:           336.40 cm/s  AV Vmean:          256.750 cm/s  AV VTI:            0.770 m  AV Peak Grad:      45.3 mmHg  AV Mean Grad:      29.8 mmHg  LVOT Vmax:         116.63 cm/s  LVOT Vmean:        82.800 cm/s  LVOT VTI:          0.250 m  LVOT/AV VTI ratio: 0.32  AI PHT:            519 msec    AORTA  Ao Root diam: 3.20 cm   MITRAL VALVE                    TRICUSPID  VALVE  MV Area (PHT): 5.48 cm         TR Peak grad:   37.5 mmHg  MV Decel Time: 138 msec         TR Vmax:        306.00 cm/s  MR Peak grad:      87.0 mmHg  MR Mean grad:      49.5 mmHg    SHUNTS  MR Vmax:           466.33 cm/s  Systemic VTI:  0.25 m  MR Vmean:          330.0 cm/s   Systemic Diam: 1.80 cm  MR Vena Contracta: 0.20 cm  MR PISA:           1.01 cm  MR PISA Eff ROA:   5 mm  MR PISA Radius:    0.40 cm  MV E velocity: 106.67 cm/s    TEE (04/2023) IMPRESSIONS     1. Left  ventricular ejection fraction, by estimation, is <20%. The left  ventricle has severely decreased function. The left ventricular internal  cavity size was moderately dilated.   2. Right ventricular systolic function is moderately reduced. The right  ventricular size is normal. There is normal pulmonary artery systolic  pressure. The estimated right ventricular systolic pressure is 28.4 mmHg.   3. Left atrial size was moderately dilated. No left atrial/left atrial  appendage thrombus was detected.   4. Right atrial size was mild to moderately dilated.   5. The mitral valve is normal in structure. Moderate mitral valve  regurgitation. No evidence of mitral stenosis.   6. The aortic valve is tricuspid. There is severe calcifcation of the  aortic valve. Aortic valve regurgitation is mild. Severe aortic valve  stenosis. Aortic valve area, by VTI measures 0.80 cm. Aortic valve mean  gradient measures 29.0 mmHg.   7. The inferior vena cava is normal in size with <50% respiratory  variability, suggesting right atrial pressure of 8 mmHg.   8. A small pericardial effusion is present.   FINDINGS   Left Ventricle: Left ventricular ejection fraction, by estimation, is  <20%. The left ventricle has severely decreased function. The left  ventricular internal cavity size was moderately dilated. There is no left  ventricular hypertrophy.   Right Ventricle: The right ventricular size is normal. No increase in  right ventricular wall thickness. Right ventricular systolic function is  moderately reduced. There is normal pulmonary artery systolic pressure.  The tricuspid regurgitant velocity is  2.26 m/s, and with an assumed right atrial pressure of 8 mmHg, the  estimated right ventricular systolic pressure is 28.4 mmHg.   Left Atrium: Left atrial size was moderately dilated. No left atrial/left  atrial appendage thrombus was detected.   Right Atrium: Right atrial size was mild to moderately dilated.    Pericardium: A small pericardial effusion is present.   Mitral Valve: The mitral valve is normal in structure. Mild to moderate  mitral annular calcification. Moderate mitral valve regurgitation. No  evidence of mitral valve stenosis.   Tricuspid Valve: The tricuspid valve is normal in structure. Tricuspid  valve regurgitation is mild.   Aortic Valve: The aortic valve is tricuspid. There is severe calcifcation  of the aortic valve. Aortic valve regurgitation is mild. Severe aortic  stenosis is present. Aortic valve mean gradient measures 29.0 mmHg. Aortic  valve peak gradient measures 47.9  mmHg. Aortic valve area, by VTI measures 0.80 cm.   Pulmonic  Valve: The pulmonic valve was normal in structure. Pulmonic valve  regurgitation is not visualized.   Aorta: The aortic root is normal in size and structure.   Venous: The inferior vena cava is normal in size with less than 50%  respiratory variability, suggesting right atrial pressure of 8 mmHg.   IAS/Shunts: No atrial level shunt detected by color flow Doppler.   Additional Comments: Spectral Doppler performed.   LEFT VENTRICLE  PLAX 2D  LVOT diam:     2.25 cm  LV SV:         58  LV SV Index:   39  LVOT Area:     3.98 cm     IVC  IVC diam: 1.70 cm   AORTIC VALVE  AV Area (Vmax):    0.85 cm  AV Area (Vmean):   0.85 cm  AV Area (VTI):     0.80 cm  AV Vmax:           346.00 cm/s  AV Vmean:          228.500 cm/s  AV VTI:            0.717 m  AV Peak Grad:      47.9 mmHg  AV Mean Grad:      29.0 mmHg  LVOT Vmax:         74.20 cm/s  LVOT Vmean:        48.600 cm/s  LVOT VTI:          0.145 m  LVOT/AV VTI ratio: 0.20   MR Peak grad:    102.0 mmHg   TRICUSPID VALVE  MR Mean grad:    63.0 mmHg    TR Peak grad:   20.4 mmHg  MR Vmax:         505.00 cm/s  TR Vmax:        226.00 cm/s  MR Vmean:        368.0 cm/s  MR PISA:         2.26 cm     SHUNTS  MR PISA Eff ROA: 17 mm       Systemic VTI:  0.14 m  MR PISA  Radius:  0.60 cm      Systemic Diam: 2.25 cm    CATH (04/2023) Conclusion   1. Nonobstructive mild coronary disease. 2. LV and RV filling pressures remain elevated.  3. Pulmonary venous hypertension.  4. Good cardiac output on milrinone  0.25.     Recent Radiology Findings:   CTA (08/2023) FINDINGS: Aortic Root:   Aortic valve: Trileaflet   Aortic valve calcium score: 2318   Aortic annulus:   Diameter: 27mm x 18mm   Perimeter: 72mm   Area: 352mm^2   Calcifications: No calcifications   Coronary height: Min Left - 15mm ; Min Right - 13mm   Sinotubular height: Left cusp - 24mm; Right cusp - 20mm; Noncoronary cusp - 24mm   LVOT (as measured 3 mm below the annulus):   Diameter: 28mm x 19mm   Area: 470mm^2   Calcifications: No calcifications   Aortic sinus width: Left cusp - 36mm; Right cusp - 31mm; Noncoronary cusp - 33mm   Sinotubular junction width: 29mm x 28mm   Optimum Fluoroscopic Angle for Delivery: LAO 14 CAU 16   Cardiac:   Right atrium: Moderate enlargement   Right ventricle: Moderate enlargement   Pulmonary arteries: Dilated main pulmonary artery measuring 31mm   Pulmonary veins: Normal configuration   Left atrium: Severe enlargement  Left ventricle: Severe enlargement   Pericardium: Normal thickness   Coronary arteries: Normal origins. Coronary calcium score 221 (62nd percentile)   IMPRESSION: 1. Tricuspid aortic valve with severe calcifications (AV calcium score 2318)   2. Aortic annulus measures 27mm x 18mm in diameter with perimeter 72mm and area 334mm^2. No annular or LVOT calcifications. Annular measurements are suitable for delivery of a 23mm Edwards Sapien 3 valve   3.  Sufficient coronary to annulus distance   4.  Optimum Fluoroscopic Angle for Delivery:  LAO 14 CAU 16   5.  Dilated main pulmonary artery measuring 31mm   6.  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 She is a candidate for AVR however with her age and overall frailty and poor EF would only be with TAVR. She is not a bail out candidate. She understands all the risks and goals and recovery from TAVR and she wishes to proceed.

## 2023-09-11 ENCOUNTER — Encounter (HOSPITAL_COMMUNITY): Payer: Self-pay | Admitting: Cardiovascular Disease

## 2023-09-11 ENCOUNTER — Inpatient Hospital Stay (HOSPITAL_COMMUNITY)
Admission: RE | Admit: 2023-09-11 | Discharge: 2023-09-13 | DRG: 266 | Disposition: A | Discharge status: Home Health | Source: Skilled Nursing Facility | Attending: Cardiovascular Disease | Admitting: Cardiovascular Disease

## 2023-09-11 ENCOUNTER — Inpatient Hospital Stay (HOSPITAL_COMMUNITY)

## 2023-09-11 ENCOUNTER — Inpatient Hospital Stay (HOSPITAL_COMMUNITY): Payer: Self-pay

## 2023-09-11 ENCOUNTER — Other Ambulatory Visit: Payer: Self-pay

## 2023-09-11 ENCOUNTER — Encounter (HOSPITAL_COMMUNITY)
Admission: RE | Disposition: A | Discharge status: Home / Self Care | Payer: Self-pay | Source: Home / Self Care | Attending: Cardiovascular Disease

## 2023-09-11 DIAGNOSIS — E7800 Pure hypercholesterolemia, unspecified: Secondary | ICD-10-CM | POA: Diagnosis present

## 2023-09-11 DIAGNOSIS — C50212 Malignant neoplasm of upper-inner quadrant of left female breast: Secondary | ICD-10-CM | POA: Diagnosis present

## 2023-09-11 DIAGNOSIS — I350 Nonrheumatic aortic (valve) stenosis: Principal | ICD-10-CM

## 2023-09-11 DIAGNOSIS — I447 Left bundle-branch block, unspecified: Secondary | ICD-10-CM | POA: Diagnosis present

## 2023-09-11 DIAGNOSIS — D696 Thrombocytopenia, unspecified: Secondary | ICD-10-CM | POA: Diagnosis present

## 2023-09-11 DIAGNOSIS — R911 Solitary pulmonary nodule: Secondary | ICD-10-CM | POA: Diagnosis present

## 2023-09-11 DIAGNOSIS — Z803 Family history of malignant neoplasm of breast: Secondary | ICD-10-CM | POA: Diagnosis not present

## 2023-09-11 DIAGNOSIS — Z79811 Long term (current) use of aromatase inhibitors: Secondary | ICD-10-CM | POA: Diagnosis not present

## 2023-09-11 DIAGNOSIS — I4819 Other persistent atrial fibrillation: Secondary | ICD-10-CM | POA: Diagnosis present

## 2023-09-11 DIAGNOSIS — Z006 Encounter for examination for normal comparison and control in clinical research program: Secondary | ICD-10-CM | POA: Diagnosis not present

## 2023-09-11 DIAGNOSIS — R54 Age-related physical debility: Secondary | ICD-10-CM | POA: Diagnosis present

## 2023-09-11 DIAGNOSIS — I272 Pulmonary hypertension, unspecified: Secondary | ICD-10-CM | POA: Diagnosis present

## 2023-09-11 DIAGNOSIS — Z952 Presence of prosthetic heart valve: Principal | ICD-10-CM

## 2023-09-11 DIAGNOSIS — I340 Nonrheumatic mitral (valve) insufficiency: Secondary | ICD-10-CM | POA: Diagnosis present

## 2023-09-11 DIAGNOSIS — I5023 Acute on chronic systolic (congestive) heart failure: Secondary | ICD-10-CM | POA: Diagnosis present

## 2023-09-11 DIAGNOSIS — I130 Hypertensive heart and chronic kidney disease with heart failure and stage 1 through stage 4 chronic kidney disease, or unspecified chronic kidney disease: Secondary | ICD-10-CM | POA: Diagnosis present

## 2023-09-11 DIAGNOSIS — E46 Unspecified protein-calorie malnutrition: Secondary | ICD-10-CM

## 2023-09-11 DIAGNOSIS — Z170 Estrogen receptor positive status [ER+]: Secondary | ICD-10-CM | POA: Diagnosis not present

## 2023-09-11 DIAGNOSIS — E785 Hyperlipidemia, unspecified: Secondary | ICD-10-CM | POA: Diagnosis present

## 2023-09-11 DIAGNOSIS — R001 Bradycardia, unspecified: Secondary | ICD-10-CM | POA: Diagnosis present

## 2023-09-11 DIAGNOSIS — N1832 Chronic kidney disease, stage 3b: Secondary | ICD-10-CM | POA: Diagnosis present

## 2023-09-11 DIAGNOSIS — I5020 Unspecified systolic (congestive) heart failure: Secondary | ICD-10-CM

## 2023-09-11 DIAGNOSIS — D6869 Other thrombophilia: Secondary | ICD-10-CM | POA: Diagnosis present

## 2023-09-11 DIAGNOSIS — Z79899 Other long term (current) drug therapy: Secondary | ICD-10-CM

## 2023-09-11 DIAGNOSIS — Z681 Body mass index (BMI) 19 or less, adult: Secondary | ICD-10-CM

## 2023-09-11 DIAGNOSIS — E1122 Type 2 diabetes mellitus with diabetic chronic kidney disease: Secondary | ICD-10-CM | POA: Diagnosis present

## 2023-09-11 DIAGNOSIS — I428 Other cardiomyopathies: Secondary | ICD-10-CM | POA: Diagnosis present

## 2023-09-11 DIAGNOSIS — Z7901 Long term (current) use of anticoagulants: Secondary | ICD-10-CM

## 2023-09-11 HISTORY — DX: Paroxysmal atrial fibrillation: I48.0

## 2023-09-11 HISTORY — DX: Presence of prosthetic heart valve: Z95.2

## 2023-09-11 HISTORY — DX: Nonrheumatic mitral (valve) insufficiency: I34.0

## 2023-09-11 HISTORY — DX: Malignant neoplasm of unspecified site of unspecified female breast: C50.919

## 2023-09-11 HISTORY — DX: Unspecified protein-calorie malnutrition: E46

## 2023-09-11 HISTORY — DX: Unspecified systolic (congestive) heart failure: I50.20

## 2023-09-11 HISTORY — DX: Nonrheumatic aortic (valve) stenosis: I35.0

## 2023-09-11 LAB — POCT I-STAT, CHEM 8
BUN: 25 mg/dL — ABNORMAL HIGH (ref 8–23)
Calcium, Ion: 1.13 mmol/L — ABNORMAL LOW (ref 1.15–1.40)
Chloride: 107 mmol/L (ref 98–111)
Creatinine, Ser: 1.4 mg/dL — ABNORMAL HIGH (ref 0.44–1.00)
Glucose, Bld: 124 mg/dL — ABNORMAL HIGH (ref 70–99)
HCT: 30 % — ABNORMAL LOW (ref 36.0–46.0)
Hemoglobin: 10.2 g/dL — ABNORMAL LOW (ref 12.0–15.0)
Potassium: 4 mmol/L (ref 3.5–5.1)
Sodium: 142 mmol/L (ref 135–145)
TCO2: 22 mmol/L (ref 22–32)

## 2023-09-11 LAB — ABO/RH: ABO/RH(D): O POS

## 2023-09-11 LAB — POCT ACTIVATED CLOTTING TIME: Activated Clotting Time: 210 s

## 2023-09-11 LAB — ECHOCARDIOGRAM LIMITED
AR max vel: 3.5 cm2
AV Area VTI: 3.26 cm2
AV Area mean vel: 3.16 cm2
AV Mean grad: 7 mmHg
AV Peak grad: 14.3 mmHg
Ao pk vel: 1.89 m/s
Calc EF: 28 %
S' Lateral: 5.2 cm
Single Plane A2C EF: 22.8 %
Single Plane A4C EF: 31.1 %

## 2023-09-11 SURGERY — TRANSCATHETER AORTIC VALVE REPLACEMENT, TRANSFEMORAL (CATHLAB)
Anesthesia: Monitor Anesthesia Care

## 2023-09-11 MED ORDER — ACETAMINOPHEN 650 MG RE SUPP: 650.0000 mg | Freq: Four times a day (QID) | RECTAL | Status: DC | PRN

## 2023-09-11 MED ORDER — LIDOCAINE HCL (PF) 1 % IJ SOLN
INTRAMUSCULAR | Status: DC | PRN
  Administered 2023-09-11 (×2): 15 mL

## 2023-09-11 MED ORDER — SODIUM CHLORIDE 0.9 % IV SOLN: INTRAVENOUS | Status: DC | PRN

## 2023-09-11 MED ORDER — MIDAZOLAM HCL 2 MG/2ML IJ SOLN
INTRAMUSCULAR | Status: AC
Start: 2023-09-11 — End: ?
  Filled 2023-09-11: qty 2

## 2023-09-11 MED ORDER — CHLORHEXIDINE GLUCONATE 4 % EX SOLN
60.0000 mL | Freq: Once | CUTANEOUS | Status: DC
  Filled 2023-09-11: qty 60

## 2023-09-11 MED ORDER — SODIUM CHLORIDE 0.9% FLUSH
3.0000 mL | Freq: Two times a day (BID) | INTRAVENOUS | Status: DC
  Administered 2023-09-12 – 2023-09-13 (×2): 3 mL via INTRAVENOUS

## 2023-09-11 MED ORDER — HEPARIN (PORCINE) IN NACL 2000-0.9 UNIT/L-% IV SOLN
INTRAVENOUS | Status: DC | PRN
  Administered 2023-09-11: 1000 mL

## 2023-09-11 MED ORDER — NITROGLYCERIN IN D5W 200-5 MCG/ML-% IV SOLN: 0.0000 ug/min | INTRAVENOUS | Status: DC

## 2023-09-11 MED ORDER — LETROZOLE 2.5 MG PO TABS
2.5000 mg | ORAL_TABLET | Freq: Every day | ORAL | Status: DC
  Administered 2023-09-11 – 2023-09-13 (×3): 2.5 mg via ORAL
  Filled 2023-09-11 (×3): qty 1

## 2023-09-11 MED ORDER — ONDANSETRON HCL 4 MG/2ML IJ SOLN
INTRAMUSCULAR | Status: DC | PRN
  Administered 2023-09-11 (×2): 4 mg via INTRAVENOUS

## 2023-09-11 MED ORDER — PROTAMINE SULFATE 10 MG/ML IV SOLN
INTRAVENOUS | Status: DC | PRN
  Administered 2023-09-11: 100 mg via INTRAVENOUS

## 2023-09-11 MED ORDER — SODIUM CHLORIDE 0.9 % IV SOLN: INTRAVENOUS | Status: AC

## 2023-09-11 MED ORDER — ISOSORBIDE MONONITRATE ER 30 MG PO TB24: 15.0000 mg | ORAL_TABLET | Freq: Every day | ORAL | Status: DC

## 2023-09-11 MED ORDER — ONDANSETRON HCL 4 MG/2ML IJ SOLN: 4.0000 mg | Freq: Four times a day (QID) | INTRAMUSCULAR | Status: DC | PRN

## 2023-09-11 MED ORDER — NOREPINEPHRINE 4 MG/250ML-% IV SOLN: 0.0000 ug/min | INTRAVENOUS | Status: DC

## 2023-09-11 MED ORDER — SODIUM CHLORIDE 0.9 % IV SOLN: INTRAVENOUS | Status: DC

## 2023-09-11 MED ORDER — MORPHINE SULFATE (PF) 2 MG/ML IV SOLN: 1.0000 mg | INTRAVENOUS | Status: DC | PRN

## 2023-09-11 MED ORDER — IODIXANOL 320 MG/ML IV SOLN
INTRAVENOUS | Status: DC | PRN
  Administered 2023-09-11: 35 mL

## 2023-09-11 MED ORDER — CHLORHEXIDINE GLUCONATE 0.12 % MT SOLN
15.0000 mL | Freq: Once | OROMUCOSAL | Status: AC
  Administered 2023-09-11: 15 mL via OROMUCOSAL

## 2023-09-11 MED ORDER — TRAMADOL HCL 50 MG PO TABS: 50.0000 mg | ORAL_TABLET | ORAL | Status: DC | PRN

## 2023-09-11 MED ORDER — PROPOFOL 10 MG/ML IV BOLUS
INTRAVENOUS | Status: DC | PRN
  Administered 2023-09-11: 15 mg via INTRAVENOUS
  Administered 2023-09-11 (×3): 10 mg via INTRAVENOUS
  Administered 2023-09-11: 10 ug/kg/min via INTRAVENOUS
  Administered 2023-09-11 (×4): 10 mg via INTRAVENOUS

## 2023-09-11 MED ORDER — CEFAZOLIN SODIUM-DEXTROSE 2-4 GM/100ML-% IV SOLN
2.0000 g | Freq: Three times a day (TID) | INTRAVENOUS | Status: AC
  Administered 2023-09-11 – 2023-09-12 (×2): 2 g via INTRAVENOUS
  Filled 2023-09-11 (×2): qty 100

## 2023-09-11 MED ORDER — AMIODARONE HCL 200 MG PO TABS
200.0000 mg | ORAL_TABLET | Freq: Every day | ORAL | Status: DC
  Administered 2023-09-13: 200 mg via ORAL
  Filled 2023-09-11 (×2): qty 1

## 2023-09-11 MED ORDER — ACETAMINOPHEN 325 MG PO TABS
650.0000 mg | ORAL_TABLET | Freq: Four times a day (QID) | ORAL | Status: DC | PRN
  Administered 2023-09-11: 650 mg via ORAL
  Filled 2023-09-11: qty 2

## 2023-09-11 MED ORDER — SODIUM CHLORIDE 0.9 % IV SOLN: 250.0000 mL | INTRAVENOUS | Status: DC | PRN

## 2023-09-11 MED ORDER — HEPARIN SODIUM (PORCINE) 1000 UNIT/ML IJ SOLN
INTRAMUSCULAR | Status: DC | PRN
  Administered 2023-09-11: 10000 [IU] via INTRAVENOUS

## 2023-09-11 MED ORDER — CHLORHEXIDINE GLUCONATE 4 % EX SOLN
30.0000 mL | CUTANEOUS | Status: DC
  Filled 2023-09-11: qty 30

## 2023-09-11 MED ORDER — EPHEDRINE SULFATE-NACL 50-0.9 MG/10ML-% IV SOSY
PREFILLED_SYRINGE | INTRAVENOUS | Status: DC | PRN
  Administered 2023-09-11: 5 mg via INTRAVENOUS
  Administered 2023-09-11 (×2): 2.5 mg via INTRAVENOUS

## 2023-09-11 MED ORDER — OXYCODONE HCL 5 MG PO TABS
5.0000 mg | ORAL_TABLET | ORAL | Status: DC | PRN
Start: 2023-09-11 — End: 2023-09-13

## 2023-09-11 MED ORDER — SODIUM CHLORIDE 0.9% FLUSH: 3.0000 mL | INTRAVENOUS | Status: DC | PRN

## 2023-09-11 MED ORDER — MIDAZOLAM HCL 5 MG/5ML IJ SOLN
INTRAMUSCULAR | Status: DC | PRN
Start: 2023-09-11 — End: 2023-09-11
  Administered 2023-09-11: .25 mg via INTRAVENOUS

## 2023-09-11 SURGICAL SUPPLY — 28 items
BAG SNAP BAND KOVER 36X36 (MISCELLANEOUS) ×2 IMPLANT
CABLE ADAPT PACING TEMP 12FT (ADAPTER) IMPLANT
CATH 23 ULTRA DELIVERY (CATHETERS) IMPLANT
CATH DIAG 6FR PIGTAIL ANGLED (CATHETERS) IMPLANT
CATH INFINITI 5FR ANG PIGTAIL (CATHETERS) IMPLANT
CATH INFINITI 6F AL1 (CATHETERS) IMPLANT
CATH S G BIP PACING (CATHETERS) IMPLANT
CLOSURE MYNX CONTROL 6F/7F (Vascular Products) IMPLANT
CLOSURE PERCLOSE PROSTYLE (VASCULAR PRODUCTS) IMPLANT
CRIMPER (MISCELLANEOUS) IMPLANT
DEVICE INFLATION ATRION QL2530 (MISCELLANEOUS) IMPLANT
KIT MICROPUNCTURE NIT STIFF (SHEATH) IMPLANT
KIT SAPIAN 3 ULTRA RESILIA 23 (Valve) IMPLANT
PACK CARDIAC CATHETERIZATION (CUSTOM PROCEDURE TRAY) ×1 IMPLANT
SET ATX-X65L (MISCELLANEOUS) IMPLANT
SHEATH BRITE TIP 7FR 35CM (SHEATH) IMPLANT
SHEATH INTRODUCER SET 20-26 (SHEATH) IMPLANT
SHEATH PINNACLE 6F 10CM (SHEATH) IMPLANT
SHEATH PINNACLE 8F 10CM (SHEATH) IMPLANT
SHEATH PROBE COVER 6X72 (BAG) IMPLANT
SHIELD CATH-GARD CONTAMINATION (MISCELLANEOUS) IMPLANT
STOPCOCK MORSE 400PSI 3WAY (MISCELLANEOUS) ×2 IMPLANT
WIRE AMPLATZ SS-J .035X180CM (WIRE) IMPLANT
WIRE EMERALD 3MM-J .025X260CM (WIRE) IMPLANT
WIRE EMERALD 3MM-J .035X150CM (WIRE) IMPLANT
WIRE EMERALD 3MM-J .035X260CM (WIRE) IMPLANT
WIRE EMERALD ST .035X260CM (WIRE) IMPLANT
WIRE SAFARI SM CURVE 275 (WIRE) IMPLANT

## 2023-09-11 NOTE — Transfer of Care (Signed)
 Immediate Anesthesia Transfer of Care Note  Patient: Aariana Callis  Procedure(s) Performed: Transcatheter Aortic Valve Replacement, Transfemoral ECHOCARDIOGRAM, TRANSTHORACIC  Patient Location: PACU  Anesthesia Type:MAC  Level of Consciousness: awake and alert   Airway & Oxygen Therapy: Patient Spontanous Breathing and Patient connected to nasal cannula oxygen  Post-op Assessment: Report given to RN and Post -op Vital signs reviewed and stable  Post vital signs: Reviewed and stable  Last Vitals:  Vitals Value Taken Time  BP 130/64 09/11/23 1715  Temp    Pulse 44 09/11/23 1716  Resp 17 09/11/23 1716  SpO2 99 % 09/11/23 1716  Vitals shown include unfiled device data.  Last Pain:  Vitals:   09/11/23 1224  TempSrc:   PainSc: 0-No pain         Complications: There were no known notable events for this encounter.

## 2023-09-11 NOTE — CV Procedure (Signed)
 [facility]  TAVR OPERATIVE NOTE   Date of Procedure:  09/11/2023  Preoperative Diagnosis: Severe Aortic Stenosis   Postoperative Diagnosis: Same   Procedure:   Transcatheter Aortic Valve Replacement - Transfemoral Approach  Edwards Sapien 3 THV (size 23 mm, model # 9600TFX, serial # [id number])   Co-Surgeons:  Antoinette Batman, MD and Linder Revere , MD   Anesthesiologist:  Brain Cahill  Echocardiographer:  Croitoru  Pre-operative Echo Findings: Severe aortic stenosis Severe left ventricular systolic dysfunction  Post-operative Echo Findings: No paravalvular leak Severe left ventricular systolic dysfunction  BRIEF CLINICAL NOTE AND INDICATIONS FOR SURGERY  83 yo female with history of atrial fibrillation, breast cancer, hyperlipidemia, severe aortic stenosis, severe mitral regurgitation and tricuspid valve regurgitation, NICM with severe LV systolic dysfunction here today for TAVR.    During the course of the patient's preoperative work up they have been evaluated comprehensively by a multidisciplinary team of specialists coordinated through the Multidisciplinary Heart Valve Clinic in the [facility].  They have been demonstrated to suffer from symptomatic severe aortic stenosis as noted above. The patient has been counseled extensively as to the relative risks and benefits of all options for the treatment of severe aortic stenosis including long term medical therapy, conventional surgery for aortic valve replacement, and transcatheter aortic valve replacement.  The patient has been independently evaluated by Dr. Honey Lusty with CT surgery and they are felt to be at high risk for conventional surgical aortic valve replacement. The surgeon indicated the patient would be a poor candidate for conventional surgery. Based upon review of all of the patient's preoperative diagnostic tests they are felt to be candidate for transcatheter aortic valve  replacement using the transfemoral approach as an alternative to high risk conventional surgery.    Following the decision to proceed with transcatheter aortic valve replacement, a discussion has been held regarding what types of management strategies would be attempted intraoperatively in the event of life-threatening complications, including whether or not the patient would be considered a candidate for the use of cardiopulmonary bypass and/or conversion to open sternotomy for attempted surgical intervention.  The patient has been advised of a variety of complications that might develop peculiar to this approach including but not limited to risks of death, stroke, paravalvular leak, aortic dissection or other major vascular complications, aortic annulus rupture, device embolization, cardiac rupture or perforation, acute myocardial infarction, arrhythmia, heart block or bradycardia requiring permanent pacemaker placement, congestive heart failure, respiratory failure, renal failure, pneumonia, infection, other late complications related to structural valve deterioration or migration, or other complications that might ultimately cause a temporary or permanent loss of functional independence or other long term morbidity.  The patient provides full informed consent for the procedure as described and all questions were answered preoperatively.    DETAILS OF THE OPERATIVE PROCEDURE  PREPARATION:   The patient is brought to the operating room on the above mentioned date and central monitoring was established by the anesthesia team including placement of a radial arterial line. The patient is placed in the supine position on the operating table.  Intravenous antibiotics are administered. Conscious sedation is used.   Baseline transthoracic echocardiogram was performed. The patient's chest, abdomen, both groins, and both lower extremities are prepared and draped in a sterile manner. A time out procedure is  performed.   PERIPHERAL ACCESS:   Using the modified Seldinger technique, femoral arterial and venous access were obtained with placement of a 6 Fr sheath in the  artery and a 7 Fr sheath in the vein on the left side using u/s guidance.  A pigtail diagnostic catheter was passed through the femoral arterial sheath under fluoroscopic guidance into the aortic root.  A temporary transvenous pacemaker catheter was passed through the femoral venous sheath under fluoroscopic guidance into the right ventricle.  The pacemaker was tested to ensure stable lead placement and pacemaker capture. Aortic root angiography was performed in order to determine the optimal angiographic angle for valve deployment.  TRANSFEMORAL ACCESS:  A micropuncture kit was used to gain access to the right femoral artery using u/s guidance. Position confirmed with angiography. Pre-closure with double ProGlide closure devices. The patient was heparinized systemically and ACT verified > 250 seconds.    A 14 Fr transfemoral E-sheath was introduced into the right femoral artery after progressively dilating over an Amplatz superstiff wire. An AL-1 catheter was used to direct a straight-tip exchange length wire across the native aortic valve into the left ventricle. This was exchanged out for a pigtail catheter and position was confirmed in the LV apex. imultaneous LV and Ao pressures were recorded.  The pigtail catheter was then exchanged for a Safari wire in the LV apex.   TRANSCATHETER HEART VALVE DEPLOYMENT:  An Edwards Sapien 3 THV (size 23 mm) was prepared and crimped per manufacturer's guidelines, and the proper orientation of the valve is confirmed on the [employer] delivery system. The valve was advanced through the introducer sheath using normal technique until in an appropriate position in the abdominal aorta beyond the sheath tip. The balloon was then retracted and using the fine-tuning wheel was centered on the valve. The  valve was then advanced across the aortic arch using appropriate flexion of the catheter. The valve was carefully positioned across the aortic valve annulus. The Commander catheter was retracted using normal technique. Once final position of the valve has been confirmed by angiographic assessment, the valve is deployed while temporarily holding ventilation and during rapid ventricular pacing to maintain systolic blood pressure < 50 mmHg and pulse pressure < 10 mmHg. The balloon inflation is held for >3 seconds after reaching full deployment volume. Once the balloon has fully deflated the balloon is retracted into the ascending aorta and valve function is assessed using TTE. There is felt to be no paravalvular leak and no central aortic insufficiency.  The patient's hemodynamic recovery following valve deployment is good.  The deployment balloon and guidewire are both removed. Echo demostrated acceptable post-procedural gradients, stable mitral valve function, and no AI.   PROCEDURE COMPLETION:  The sheath was then removed and closure devices were completed. Protamine was administered once femoral arterial repair was complete. The temporary pacemaker, pigtail catheters and femoral sheaths were removed with a Mynx closure device placed in the artery and manual pressure used for venous hemostasis.    The patient tolerated the procedure well and is transported to the surgical intensive care in stable condition. There were no immediate intraoperative complications. All sponge instrument and needle counts are verified correct at completion of the operation.   No blood products were administered during the operation.  The patient received a total of 50 mL of intravenous contrast during the procedure.  LVEDP: 23 mm Hg  Antoinette Batman MD, [facility] 09/11/2023 4:43 PM

## 2023-09-11 NOTE — Progress Notes (Signed)
  [facility] VASCULAR [facility]ENTER   MULTIDISCIPLINARY HEART VALVE TEAM  Patient doing well s/p TAVR. She is hemodynamically stable but currently requiring 5 mcgs of levophed. Groin sites stable. ECG with sinus bradycardia and 1st deg AV block (PR actually improved from pre op ECG 352 ms--> 298 ms) and old LBBB. She has also had some junctional rhythms and ectopy, but there has been no high grade block. Plan to transfer her to 4E once off pressors. If we cannot wean, will need 2H. LVEDP ~23 mm hg at the time of TAVR. Will diurese her tomorrow morning when pressures improve. Early ambulation after bedrest completed and hopeful discharge over the next 24-48 hours.   Abagail Hoar PA-C  [facility]  Pager [phone]

## 2023-09-11 NOTE — Progress Notes (Signed)
 Levo titrated off patient by 1750. HR  high 38-45 1st AVB with no change in BP or pt's symptoms. Wetzel Hammock PAs, were updated on pt's condition. Agreement reach pt has remained stable, and 4E bed is still appropriate level of care. 4E RN called with update. This RN called pt's son and left voicemail.

## 2023-09-11 NOTE — Op Note (Signed)
 [facility] VASCULAR [facility]ENTER   MULTIDISCIPLINARY HEART VALVE TEAM   TAVR OPERATIVE NOTE   Date of Procedure:  09/11/2023  Preoperative Diagnosis: Severe Aortic Stenosis   Postoperative Diagnosis: Same   Procedure:   Transcatheter Aortic Valve Replacement - Percutaneous Right Transfemoral Approach  Edwards Sapien 3 Ultra Resilia THV (size 23 mm, model # 9755RSL, serial # [id number])   Co-Surgeons:  Bartley Lightning, MD and Antoinette Batman, MD   Anesthesiologist:  C. Brain Cahill, MD  Echocardiographer:  Melven Stable. Croitoru, MD  Pre-operative Echo Findings: Severe aortic stenosis Severe left ventricular systolic dysfunction  Post-operative Echo Findings: No paravalvular leak Unchanged severe left ventricular systolic dysfunction   BRIEF CLINICAL NOTE AND INDICATIONS FOR SURGERY  83 yo frail female with NYHA class 1-2 symptoms of severe low flow low gradient AS with a significant cardiomyopathy of EF 20-25% and no CAD. She is a candidate for AVR however with her age and overall frailty and poor EF would only be with TAVR. She is not a bail out candidate. She understands all the risks and goals and recovery from TAVR and she wishes to proceed.     DETAILS OF THE OPERATIVE PROCEDURE  PREPARATION:    The patient was brought to the operating room on the above mentioned date and appropriate monitoring was established by the anesthesia team. The patient was placed in the supine position on the operating table.  Intravenous antibiotics were administered. The patient was monitored closely throughout the procedure under conscious sedation.    Baseline transthoracic echocardiogram was performed. The patient's abdomen and both groins were prepped and draped in a sterile manner. A time out procedure was performed.   PERIPHERAL ACCESS:    Using the modified Seldinger technique, femoral arterial and venous access was obtained with placement of 6 Fr sheaths on the left side.  A pigtail diagnostic  catheter was passed through the left arterial sheath under fluoroscopic guidance into the aortic root.  A temporary transvenous pacemaker catheter was passed through the left femoral venous sheath under fluoroscopic guidance into the right ventricle.  The pacemaker was tested to ensure stable lead placement and pacemaker capture. Aortic root angiography was performed in order to determine the optimal angiographic angle for valve deployment.   TRANSFEMORAL ACCESS:   Percutaneous transfemoral access and sheath placement was performed using ultrasound guidance.  The right common femoral artery was cannulated using a micropuncture needle and appropriate location was verified using hand injection angiogram.  A pair of Abbott Perclose percutaneous closure devices were placed and a 6 French sheath replaced into the femoral artery.  The patient was heparinized systemically and ACT verified > 250 seconds.    A 14 Fr transfemoral E-sheath was introduced into the right common femoral artery after progressively dilating over an Amplatz superstiff wire. An AL-1 catheter was used to direct a straight-tip exchange length wire across the native aortic valve into the left ventricle. This was exchanged out for a pigtail catheter and position was confirmed in the LV apex. Simultaneous LV and Ao pressures were recorded.  The pigtail catheter was exchanged for a Safari wire in the LV apex.   BALLOON AORTIC VALVULOPLASTY:   Not performed   TRANSCATHETER HEART VALVE DEPLOYMENT:   An Edwards Sapien 3 Ultra transcatheter heart valve (size 23 mm) was prepared and crimped per manufacturer's guidelines, and the proper orientation of the valve is confirmed on the [employer] delivery system. The valve was advanced through the introducer sheath using normal technique until in  an appropriate position in the abdominal aorta beyond the sheath tip. The balloon was then retracted and using the fine-tuning wheel was centered on  the valve. The valve was then advanced across the aortic arch using appropriate flexion of the catheter. The valve was carefully positioned across the aortic valve annulus. The Commander catheter was retracted using normal technique. Once final position of the valve has been confirmed by angiographic assessment, the valve is deployed during rapid ventricular pacing to maintain systolic blood pressure < 50 mmHg and pulse pressure < 10 mmHg. The balloon inflation is held for >3 seconds after reaching full deployment volume. Once the balloon has fully deflated the balloon is retracted into the ascending aorta and valve function is assessed using echocardiography. There is felt to be no paravalvular leak and no central aortic insufficiency.  The patient's hemodynamic recovery following valve deployment is good.  The deployment balloon and guidewire are both removed.    PROCEDURE COMPLETION:   The sheath was removed and femoral artery closure performed.  Protamine was administered once femoral arterial repair was complete. The temporary pacemaker, pigtail catheter and femoral sheaths were removed with manual pressure used for venous hemostasis.  A Mynx femoral closure device was utilized following removal of the diagnostic sheath in the left femoral artery.  The patient tolerated the procedure well and is transported to the cath lab recovery area in stable condition. There were no immediate intraoperative complications. All sponge instrument and needle counts are verified correct at completion of the operation.   No blood products were administered during the operation.  The patient received a total of 50 mL of intravenous contrast during the procedure.   Bartley Lightning, MD 09/11/2023

## 2023-09-11 NOTE — Discharge Summary (Addendum)
 HEART AND VASCULAR CENTER   MULTIDISCIPLINARY HEART VALVE TEAM  Discharge Summary    Patient ID: Tara Hardin MRN: [id number]; DOB: [birth year]  Admit date: 09/11/2023 Discharge date: 09/13/2023  PCP:  Faustina Hood, MD  [facility] Cardiologist:  Antoinette Batman, MD  [facility] Structural heart: Antoinette Batman, MD [facility] Electrophysiologist:  Boyce Byes, MD   Discharge Diagnoses    Principal Problem:   S/P TAVR (transcatheter aortic valve replacement) Active Problems:   Dyslipidemia   Hyperlipidemia   Malignant neoplasm of upper-inner quadrant of left breast in female, estrogen receptor positive (HCC)   Persistent atrial fibrillation (HCC)   HFrEF (heart failure with reduced ejection fraction) (HCC)   Mitral regurgitation   Severe aortic stenosis   Allergies No Known Allergies  Diagnostic Studies/Procedures     HEART AND VASCULAR CENTER  TAVR OPERATIVE NOTE     Date of Procedure:                09/11/2023   Preoperative Diagnosis:      Severe Aortic Stenosis    Postoperative Diagnosis:    Same    Procedure:        Transcatheter Aortic Valve Replacement - Transfemoral Approach             Edwards Sapien 3 THV (size 23 mm, model # 9600TFX, serial # [id number])              Co-Surgeons:                        Antoinette Batman, MD and Linder Revere , MD    Anesthesiologist:                  Brain Cahill   Echocardiographer:              Croitoru   Pre-operative Echo Findings: Severe aortic stenosis Severe left ventricular systolic dysfunction   Post-operative Echo Findings: No paravalvular leak Severe left ventricular systolic dysfunction  _____________    Echo 09/12/23: IMPRESSIONS   1. Left ventricular ejection fraction, by estimation, is 25 to 30%. Left  ventricular ejection fraction by 2D MOD biplane is 31.7 %. The left  ventricle has severely decreased function. The left ventricle demonstrates  global hypokinesis.   2.  Right ventricular systolic function is normal. The right ventricular  size is normal.   3. Left atrial size was severely dilated.   4. Right atrial size was severely dilated.   5. The mitral valve is normal in structure. Mild mitral valve  regurgitation. No evidence of mitral stenosis.   6. Mild-moderate perivalvular aortic regurgitation at the region of the  anterior mitral valve leaflet. The aortic valve has been  repaired/replaced. Aortic valve regurgitation is mild to moderate. No  aortic stenosis is present. There is a 23 mm Sapien  prosthetic (TAVR) valve present in the aortic position. Procedure Date:  09/11/23. Aortic valve area, by VTI measures 1.06 cm. Aortic valve mean  gradient measures 10.0 mmHg. Aortic valve Vmax measures 2.23 m/s.   7. The inferior vena cava is normal in size with greater than 50%  respiratory variability, suggesting right atrial pressure of 3 mmHg.   History of Present Illness     Tara Hardin is a 83 y.o. female with a history of persistent atrial fibrillation s/p DCCV on amiodarone /Eliquis , HFrEF, NICM (turned down for CRT-D due to frailty), CKD stage IIIb, breast cancer, HLD, 1st deg AV block, LBBB, mitral  valve regurgitation and severe LFLG AS who presented to [facility] on 09/11/23 for planned TAVR.   She has been followed by Dr. Abel Hoe for aortic stenosis. Echo in 04/2022 showed EF 50% and moderate-severe AS, mild MR, mild-mod TR. She developed atrial fibrillation and heart failure in 10/2022. Echo 04/05/23 showed dramatic drop in EF <20%, severe LAE, severe MR and mod-severe AS. She was eventually referred to the advanced CHF clinic and seen by Dr. Julane Ny. She was admitted at the end of 04/2023 for acute CHF requiring milrinone . She underwent [facility] 05/14/23 which showed mild coronary disease (30% Cx stenosis), elevated right and left filling pressures, pulmonary venous hypertension and good CO on milrinone  0.25. S/p TEE guided DCCV to NSR on 05/15/23. Seen by  EP for reduced EF and LBBB, but turned down for CRT-D given frailty, LV dilation, low body mass. Diuresed with IV lasix  and started on GDMT (limited by hypotension and bradycardia) and discharged to an ILF. She has improved on medical therapy but still complained of DOE and fatigue.  The patient was evaluated by the multidisciplinary valve team and felt to have severe, symptomatic LFLG aortic stenosis and to be a suitable candidate for TAVR, which was set up for 09/11/23.   Hospital Course     Consultants: none   Severe LFLG AS:  -- S/p successful TAVR with a 23 mm Edwards Sapien 3 Ultra Resilia THV via the TF approach on 09/11/23.  -- Post operative echo showed EF 25%, normally functioning TAVR with a mean gradient of 10 mmHg and mild to mod PVL as well as mild MR. -- Groin sites are stable.  -- Resume home Eliquis  2.5mg  BID.  -- Met with cardiac rehab to discuss CRP phase II.  -- SBE prophylaxis discussed; I have RX'd amoxicillin . -- Plan for discharge home today with close follow up in the outpatient setting.   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 -- HRs persistently in 30-40s but no HAVB or symptoms.  -- Monitored for 48 hours and HRs now improved into 50s.  -- ECG at follow up next week.  -- Could consider decreasing amio to 100mg  daily.   NICM -- EF 25-30% -- Turned down for CRT-D due to frailty. -- GDMT limited by hypotension and bradycardia.  Acute on chronic HFrEF: -- LVEDP 23 mm hg at the time of TAVR. -- Treated with IV lasix  40mg  x 1 and Kdur x1.  -- Resume spiro 12.5mg  daily and Lasix  20mg  daily at discharge.   Persistent atrial fibrillation: -- Maintaining sinus since DCCV in 04/2023. -- Continue amiodarone  200mg  daily.   CKD stage IIIb: -- Creat 1.16 today with a GFR of 47.  Thrombocytopenia: -- Plts down to 104. No s/s bleeding. Likely reactive after surgery.  Frailty: -- Body  mass index is 17.29 kg/m. -- Continue good nutrition.   Pulmonary nodule: -- Pre TAVR CTs showed an 8 mm nodule with pleural stranding in the anterior apex of the left upper lobe. Non-contrast chest CT at 6-12 months is recommended. If the nodule is stable at time of repeat CT, then future CT at 18-24 months (from today's scan) is considered optional for low-risk patients, but is recommended for high-risk patients. -- Due to history of breast cancer, I will send her to the pulmonary nodule clinic for follow up over time.  -- This will be discussed in the outpatient setting.   _____________  Discharge Vitals Blood pressure (!) 112/44, pulse (!) 51, temperature (!) 97.4 F (36.3 C), temperature source Oral, resp. rate 19, height 5\' 4"  (1.626 m), weight 45.7 kg, SpO2 95%.  Filed Weights   09/11/23 1149 09/12/23 0452 09/13/23 0458  Weight: 49 kg 48.4 kg 45.7 kg     GEN: Well nourished, well developed in no acute distress NECK: No JVD CARDIAC: RR, brady, no murmurs, rubs, gallops RESPIRATORY:  Clear to auscultation without rales, wheezing or rhonchi  ABDOMEN: Soft, non-tender, non-distended EXTREMITIES:  No edema; No deformity.  Groin sites clear without hematoma or ecchymosis.    Disposition   Pt is being discharged home today in good condition.  Follow-up Plans & Appointments     Follow-up Information     Myrtha Ates. Go on 09/17/2023.   Why: @ 2pm, please arrive at least 15 minutes early Contact information: [facility]  [address]  [address]               Discharge Instructions     Amb Referral to Cardiac Rehabilitation   Complete by: As directed    Diagnosis: Valve Replacement   Valve: Aortic Comment - tavr   After initial evaluation and assessments completed: Virtual Based Care may be provided alone or in conjunction with Phase 2 Cardiac Rehab based on patient barriers.: Yes   Intensive Cardiac Rehabilitation (ICR) [facility] location only OR  Traditional Cardiac Rehabilitation (TCR) *If criteria for ICR are not met will enroll in TCR [facility] only): Yes       Discharge Medications   Allergies as of 09/13/2023   No Known Allergies      Medication List     TAKE these medications    acetaminophen  650 MG CR tablet Commonly known as: TYLENOL  Take 650 mg by mouth every 8 (eight) hours as needed for pain.   amiodarone  200 MG tablet Commonly known as: PACERONE  Take 1 tablet (200 mg total) by mouth daily.   amoxicillin  500 MG tablet Commonly known as: AMOXIL  Take 4 tablets (2,000 mg total) by mouth as directed. Take 1 hour prior to dental work including cleanings to protect heart valve from becoming infected.   apixaban  2.5 MG Tabs tablet Commonly known as: Eliquis  Take 1 tablet (2.5 mg total) by mouth 2 (two) times daily.   bismuth  subsalicylate 262 MG/15ML suspension Commonly known as: PEPTO BISMOL Take 30 mLs by mouth every 8 (eight) hours as needed for indigestion or diarrhea or loose stools.   furosemide  20 MG tablet Commonly known as: Lasix  Take 2 tablets (40 mg total) by mouth daily. What changed: how much to take   letrozole  2.5 MG tablet Commonly known as: FEMARA  Take 1 tablet (2.5 mg total) by mouth daily.   sodium chloride  0.65 % nasal spray Commonly known as: OCEAN Place 1 spray into the nose every 8 (eight) hours as needed (Dry nasal passages).   spironolactone  25 MG tablet Commonly known as: ALDACTONE  Take 0.5 tablets (12.5 mg total) by mouth daily.            Outstanding Labs/Studies   none ______________________  Duration of Discharge Encounter: APP Time: 20 minutes    Signed, Abagail Hoar, PA-C 09/13/2023, 9:38 AM [phone]  I have personally seen and examined this patient. I agree with the assessment and plan as outlined above.  Pt doing well. Eating breakfast. Groins stable. Labs reviewed. EKG reviewed. Telemetry reviewed. Sinus brady.  My exam: Thin female in NAD.   CV:RRR  Pulm: clear bilaterally  Groins without hematoma Plan: Will d/c home today.   I have spent 25 minutes with chart review, note review, lab review, telemetry review, patient examination, plan formulation.   Antoinette Batman, MD, [facility] 09/13/2023 9:50 AM

## 2023-09-11 NOTE — Progress Notes (Signed)
 Late entry: appreciate Katie's input in nurse update on this patient's VS. Was deemed stable to land on 4E. BP stable. HR primarily 40s. Continue plan as outlined.

## 2023-09-11 NOTE — Discharge Instructions (Signed)

## 2023-09-11 NOTE — Interval H&P Note (Signed)
 History and Physical Interval Note:  09/11/2023 12:51 PM  Tara Hardin  has presented today for surgery, with the diagnosis of Severe Aortic Stenosis.  The various methods of treatment have been discussed with the patient and family. After consideration of risks, benefits and other options for treatment, the patient has consented to  Procedure(s): Transcatheter Aortic Valve Replacement, Transfemoral (N/A) ECHOCARDIOGRAM, TRANSTHORACIC (N/A) as a surgical intervention.  The patient's history has been reviewed, patient examined, no change in status, stable for surgery.  I have reviewed the patient's chart and labs.  Questions were answered to the patient's satisfaction.     Manju Kulkarni K Lynsey Ange

## 2023-09-11 NOTE — Progress Notes (Signed)
  2D Echocardiogram has been performed.  Royden Corin 09/11/2023, 4:29 PM

## 2023-09-11 NOTE — Anesthesia Preprocedure Evaluation (Signed)
 Anesthesia Evaluation  Patient identified by MRN, date of birth, ID band Patient awake    Reviewed: Allergy & Precautions, NPO status , Patient's Chart, lab work & pertinent test results  History of Anesthesia Complications Negative for: history of anesthetic complications  Airway Mallampati: III  TM Distance: >3 FB Neck ROM: Full    Dental  (+) Dental Advisory Given   Pulmonary neg shortness of breath, neg sleep apnea, neg COPD, neg recent URI   breath sounds clear to auscultation       Cardiovascular +CHF  + Valvular Problems/Murmurs AS  Rhythm:Regular + Systolic murmurs 1. Left ventricular ejection fraction, by estimation, is <20%. The left  ventricle has severely decreased function. The left ventricular internal  cavity size was moderately dilated.   2. Right ventricular systolic function is moderately reduced. The right  ventricular size is normal. There is normal pulmonary artery systolic  pressure. The estimated right ventricular systolic pressure is 28.4 mmHg.   3. Left atrial size was moderately dilated. No left atrial/left atrial  appendage thrombus was detected.   4. Right atrial size was mild to moderately dilated.   5. The mitral valve is normal in structure. Moderate mitral valve  regurgitation. No evidence of mitral stenosis.   6. The aortic valve is tricuspid. There is severe calcifcation of the  aortic valve. Aortic valve regurgitation is mild. Severe aortic valve  stenosis. Aortic valve area, by VTI measures 0.80 cm. Aortic valve mean  gradient measures 29.0 mmHg.   7. The inferior vena cava is normal in size with <50% respiratory  variability, suggesting right atrial pressure of 8 mmHg.   8. A small pericardial effusion is present.     Neuro/Psych negative neurological ROS  negative psych ROS   GI/Hepatic negative GI ROS, Neg liver ROS,,,  Endo/Other  negative endocrine ROS    Renal/GU Renal  InsufficiencyRenal diseaseLab Results      Component                Value               Date                      NA                       139                 09/07/2023                K                        4.0                 09/07/2023                CO2                      27                  09/07/2023                GLUCOSE                  74                  09/07/2023  BUN                      20                  09/07/2023                CREATININE               1.33 (H)            09/07/2023                CALCIUM                  9.0                 09/07/2023                EGFR                     42 (L)              07/16/2023                GFRNONAA                 40 (L)              09/07/2023                Musculoskeletal negative musculoskeletal ROS (+)    Abdominal   Peds  Hematology Lab Results      Component                Value               Date                      WBC                      8.8                 09/07/2023                HGB                      12.0                09/07/2023                HCT                      37.1                09/07/2023                MCV                      95.6                09/07/2023                PLT                      235                 09/07/2023              Anesthesia Other Findings  Reproductive/Obstetrics                             Anesthesia Physical Anesthesia Plan  ASA: 4  Anesthesia Plan: MAC   Post-op Pain Management: Minimal or no pain anticipated   Induction: Intravenous  PONV Risk Score and Plan: 2 and Propofol  infusion, Treatment may vary due to age or medical condition and TIVA  Airway Management Planned: Nasal Cannula, Natural Airway and Simple Face Mask  Additional Equipment: Arterial line  Intra-op Plan:   Post-operative Plan:   Informed Consent: I have reviewed the patients History and Physical, chart, labs and discussed the  procedure including the risks, benefits and alternatives for the proposed anesthesia with the patient or authorized representative who has indicated his/her understanding and acceptance.     Dental advisory given  Plan Discussed with: CRNA  Anesthesia Plan Comments:        Anesthesia Quick Evaluation

## 2023-09-12 ENCOUNTER — Encounter (HOSPITAL_COMMUNITY): Payer: Self-pay | Admitting: Cardiovascular Disease

## 2023-09-12 ENCOUNTER — Inpatient Hospital Stay (HOSPITAL_COMMUNITY)

## 2023-09-12 DIAGNOSIS — Z952 Presence of prosthetic heart valve: Secondary | ICD-10-CM

## 2023-09-12 LAB — ECHOCARDIOGRAM COMPLETE
AR max vel: 1.08 cm2
AV Area VTI: 1.06 cm2
AV Area mean vel: 1.06 cm2
AV Mean grad: 10 mmHg
AV Peak grad: 19.8 mmHg
Ao pk vel: 2.23 m/s
Area-P 1/2: 3.42 cm2
Calc EF: 31.7 %
Height: 64 in
MV M vel: 3.69 m/s
MV Peak grad: 54.5 mmHg
S' Lateral: 4.4 cm
Single Plane A2C EF: 34.4 %
Single Plane A4C EF: 28.7 %
Weight: 1707.24 [oz_av]

## 2023-09-12 LAB — CBC
HCT: 29.7 % — ABNORMAL LOW (ref 36.0–46.0)
Hemoglobin: 9.7 g/dL — ABNORMAL LOW (ref 12.0–15.0)
MCH: 31.5 pg (ref 26.0–34.0)
MCHC: 32.7 g/dL (ref 30.0–36.0)
MCV: 96.4 fL (ref 80.0–100.0)
Platelets: 125 10*3/uL — ABNORMAL LOW (ref 150–400)
RBC: 3.08 MIL/uL — ABNORMAL LOW (ref 3.87–5.11)
RDW: 18 % — ABNORMAL HIGH (ref 11.5–15.5)
WBC: 8.6 10*3/uL (ref 4.0–10.5)
nRBC: 0 % (ref 0.0–0.2)

## 2023-09-12 LAB — BASIC METABOLIC PANEL WITH GFR
Anion gap: 7 (ref 5–15)
BUN: 23 mg/dL (ref 8–23)
CO2: 22 mmol/L (ref 22–32)
Calcium: 7.7 mg/dL — ABNORMAL LOW (ref 8.9–10.3)
Chloride: 109 mmol/L (ref 98–111)
Creatinine, Ser: 1.26 mg/dL — ABNORMAL HIGH (ref 0.44–1.00)
GFR, Estimated: 43 mL/min — ABNORMAL LOW (ref >60)
Glucose, Bld: 105 mg/dL — ABNORMAL HIGH (ref 70–99)
Potassium: 4.2 mmol/L (ref 3.5–5.1)
Sodium: 138 mmol/L (ref 135–145)

## 2023-09-12 LAB — MAGNESIUM: Magnesium: 2 mg/dL (ref 1.7–2.4)

## 2023-09-12 MED ORDER — FUROSEMIDE 10 MG/ML IJ SOLN
40.0000 mg | Freq: Once | INTRAMUSCULAR | Status: AC
  Administered 2023-09-12: 40 mg via INTRAVENOUS
  Filled 2023-09-12: qty 4

## 2023-09-12 MED ORDER — POTASSIUM CHLORIDE CRYS ER 20 MEQ PO TBCR
20.0000 meq | EXTENDED_RELEASE_TABLET | Freq: Once | ORAL | Status: AC
  Administered 2023-09-12: 20 meq via ORAL
  Filled 2023-09-12: qty 1

## 2023-09-12 MED ORDER — APIXABAN 2.5 MG PO TABS
2.5000 mg | ORAL_TABLET | Freq: Two times a day (BID) | ORAL | Status: DC
  Administered 2023-09-12 – 2023-09-13 (×3): 2.5 mg via ORAL
  Filled 2023-09-12 (×3): qty 1

## 2023-09-12 NOTE — Anesthesia Postprocedure Evaluation (Signed)
 Anesthesia Post Note  Patient: Tara Hardin  Procedure(s) Performed: Transcatheter Aortic Valve Replacement, Transfemoral ECHOCARDIOGRAM, TRANSTHORACIC     Patient location during evaluation: Cath Lab Anesthesia Type: MAC Level of consciousness: awake and alert Pain management: pain level controlled Vital Signs Assessment: post-procedure vital signs reviewed and stable Respiratory status: spontaneous breathing, nonlabored ventilation and respiratory function stable Cardiovascular status: stable and blood pressure returned to baseline Postop Assessment: no apparent nausea or vomiting Anesthetic complications: no   There were no known notable events for this encounter.                  Jerimy Johanson

## 2023-09-12 NOTE — Progress Notes (Addendum)
 HEART AND VASCULAR CENTER   MULTIDISCIPLINARY HEART VALVE TEAM  Patient Name: Tara Hardin Date of Encounter: 09/12/2023  Admit date: 09/11/2023  Primary Care Provider: Faustina Hood, MD [facility] Cardiologist: Antoinette Batman, MD  [facility] Electrophysiologist:  Boyce Byes, MD   Hospital Problem List     Principal Problem:   S/P TAVR (transcatheter aortic valve replacement) Active Problems:   Dyslipidemia   Hyperlipidemia   Malignant neoplasm of upper-inner quadrant of left breast in female, estrogen receptor positive (HCC)   Persistent atrial fibrillation (HCC)   HFrEF (heart failure with reduced ejection fraction) (HCC)   Mitral regurgitation   Severe aortic stenosis     Subjective   No complaints. Hasn't been up to walk around. Hungry. No SOB or CP. Would like to go home tomorrow.   Inpatient Medications    Scheduled Meds:  amiodarone   200 mg Oral Daily   apixaban   2.5 mg Oral BID   letrozole   2.5 mg Oral Daily   sodium chloride  flush  3 mL Intravenous Q12H   Continuous Infusions:  sodium chloride      nitroGLYCERIN     norepinephrine (LEVOPHED) Adult infusion Stopped (09/11/23 1750)   PRN Meds: sodium chloride , acetaminophen  **OR** acetaminophen , morphine injection, ondansetron  (ZOFRAN ) IV, oxyCODONE , sodium chloride  flush, traMADol   Vital Signs    Vitals:   09/11/23 1845 09/11/23 1906 09/12/23 0008 09/12/23 0452  BP: (!) 108/48 107/61 (!) 109/56 (!) 98/45  Pulse: (!) 38 (!) 42 64 60  Resp: (!) 24 20 20 16   Temp:  97.6 F (36.4 C) (!) 97.5 F (36.4 C) (!) 97.3 F (36.3 C)  TempSrc:  Oral Oral Oral  SpO2: 96% 98% 96% 98%  Weight:    48.4 kg  Height:        Intake/Output Summary (Last 24 hours) at 09/12/2023 1136 Last data filed at 09/12/2023 0500 Gross per 24 hour  Intake 1207.62 ml  Output 110 ml  Net 1097.62 ml   Filed Weights   09/11/23 1149 09/12/23 0452  Weight: 49 kg 48.4 kg    Physical Exam    GEN:  chronically ill appearing, thin, white female HEENT: Grossly normal.  Cardiac: RR brady, no murmurs, rubs, or gallops. No clubbing, cyanosis, edema.   Respiratory:  Respirations regular and unlabored, clear to auscultation bilaterally. GI: Soft, nontender, nondistended, BS + x 4. MS: no deformity or atrophy. Skin: warm and dry, no rash.  Groin sites clear without hematoma or ecchymosis  Neuro:  Strength and sensation are intact. Psych: AAOx3.  Normal affect.  Labs    CBC Recent Labs    09/11/23 1634 09/12/23 0333  WBC  --  8.6  HGB 10.2* 9.7*  HCT 30.0* 29.7*  MCV  --  96.4  PLT  --  125*   Basic Metabolic Panel Recent Labs    40/98/11 1634 09/12/23 0333  NA 142 138  K 4.0 4.2  CL 107 109  CO2  --  22  GLUCOSE 124* 105*  BUN 25* 23  CREATININE 1.40* 1.26*  CALCIUM  --  7.7*  MG  --  2.0    Telemetry    Sinus brady, HRs in 30-40s - Personally Reviewed  ECG    Marked sinus bradycardia with 1st degree A-V block Left axis deviation Left bundle branch block HR 42 bpm  - Personally Reviewed  Cardiac Studies   TAVR OPERATIVE NOTE     Date of Procedure:  09/11/2023   Preoperative Diagnosis:      Severe Aortic Stenosis    Postoperative Diagnosis:    Same    Procedure:        Transcatheter Aortic Valve Replacement - Percutaneous Right Transfemoral Approach             Edwards Sapien 3 Ultra Resilia THV (size 23 mm, model # [id number], serial # [id number])              Co-Surgeons:                        Bartley Lightning, MD and Antoinette Batman, MD     Anesthesiologist:                  Doraine Gallon, MD   Echocardiographer:              Cesar Collins, MD   Pre-operative Echo Findings: Severe aortic stenosis Severe left ventricular systolic dysfunction   Post-operative Echo Findings: No paravalvular leak Unchanged severe left ventricular systolic dysfunction  Patient Profile     Tara Hardin is a 83 y.o. female with a history of  persistent atrial fibrillation s/p DCCV on amiodarone /Eliquis , HFrEF, NICM (turned down for CRT-D due to frailty), CKD stage IIIb, breast cancer, HLD, 1st deg AV block, LBBB, mitral valve regurgitation and severe LFLG AS who presented to [facility] on 09/11/23 for planned TAVR.   Assessment & Plan    Severe LFLG AS:  -- S/p successful TAVR with a 23 mm Edwards Sapien 3 Ultra Resilia THV via the TF approach on 09/11/23.  -- Post operative echo completed but pending formal read. -- Groin sites are stable.  -- Resume home Eliquis  2.5mg  BID.  -- Mobilize today and hopeful discharge home tomorrow.   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 -- HRs persistently in 30-40s but no HAVB or symptoms.  -- Will keep her overnight to monitor. Hopefully this will improve at she mobilizes and anesthesia wears off.    NICM -- Turned down for CRT-D due to frailty. -- GDMT limited by hypotension and bradycardia in past.  Acute on chronic HFrEF: -- LVEDP 23 mm hg at the time of TAVR. -- Will treat with one dose of IV lasix  40mg  x 1 and Kdur 20meq x1.    Persistent atrial fibrillation: -- Maintaining sinus since DCCV in 04/2023. -- Continue amiodarone  200mg  daily.    CKD stage IIIb: -- Creat 1.26 today.    Frailty: -- Body mass index is 18.32 kg/m.     [name], PA-C  09/12/2023, 11:36 AM  Pager [phone]   I have personally seen and examined this patient. I agree with the assessment and plan as outlined above.  She is doing well one day post TAVR but still bradycardic. Labs are ok. Groins stable. EKG reviewed by me. Sinus brady with LBBB (chronic) My exam: NAD. Frail female. LK:GMWNU. Soft systolic murmur.  Plan: Will monitor for 24 more hours. Probable d/c home tomorrow.   Antoinette Batman, MD, [facility] 09/12/2023 8:09 PM

## 2023-09-12 NOTE — Progress Notes (Signed)
   09/12/23 1508  TOC Brief Assessment  Insurance and Status Reviewed  Patient has primary care physician Yes  Home environment has been reviewed home  Prior level of function: assisted  Prior/Current Home Services Current home services (Active wtih [facility] for HHPT- will need resumption order for  discharge)  Social Drivers of Health Review SDOH reviewed no interventions necessary  Readmission risk has been reviewed Yes  Transition of care needs transition of care needs identified, TOC will continue to follow    Pt s/p TAVR, notified by [facility] liaison that pt active with them for HHPT- will need resumption HH order placed on discharge. Supportive family. We will continue to monitor patient advancement through interdisciplinary progression rounds. If new patient transition needs arise, please place a TOC consult.

## 2023-09-12 NOTE — Progress Notes (Signed)
 Echocardiogram 2D Echocardiogram has been performed.  Tara Hardin 09/12/2023, 10:26 AM

## 2023-09-12 NOTE — Progress Notes (Signed)
 Mobility Specialist Progress Note:   09/12/23 1120  Mobility  Activity Transferred to/from [facility];Ambulated with assistance in room;Ambulated with assistance in hallway  Level of Assistance Standby assist, set-up cues, supervision of patient - no hands on  Assistive Device Front wheel walker  Distance Ambulated (ft) 310 ft  Activity Response Tolerated well  Mobility Referral Yes  Mobility visit 1 Mobility  Mobility Specialist Start Time (ACUTE ONLY) 1100  Mobility Specialist Stop Time (ACUTE ONLY) 1120  Mobility Specialist Time Calculation (min) (ACUTE ONLY) 20 min   Pt received in bed, agreeable to mobility session. Transferred to [facility] via [facility] prior to session. Void successful. Ambulated in room and hallway with RW. Tolerated well, asx throughout. Returned pt to room, left with all needs met, call bell in reach, son in room.    Labella Zahradnik Mobility Specialist Please contact via Special educational needs teacher or  Rehab office at [phone]

## 2023-09-12 NOTE — Progress Notes (Signed)
 Discussed with pt and son restrictions, walking as tolerated, and CRPII. Pt receptive, eager to build strength to transition back home. Will refer to [place] CRPII.  [id number] German Koller BS, ACSM-CEP 09/12/2023 11:02 AM

## 2023-09-13 LAB — CBC
HCT: 29.2 % — ABNORMAL LOW (ref 36.0–46.0)
Hemoglobin: 9.9 g/dL — ABNORMAL LOW (ref 12.0–15.0)
MCH: 31.9 pg (ref 26.0–34.0)
MCHC: 33.9 g/dL (ref 30.0–36.0)
MCV: 94.2 fL (ref 80.0–100.0)
Platelets: 104 10*3/uL — ABNORMAL LOW (ref 150–400)
RBC: 3.1 MIL/uL — ABNORMAL LOW (ref 3.87–5.11)
RDW: 17.7 % — ABNORMAL HIGH (ref 11.5–15.5)
WBC: 8.1 10*3/uL (ref 4.0–10.5)
nRBC: 0 % (ref 0.0–0.2)

## 2023-09-13 LAB — BASIC METABOLIC PANEL WITH GFR
Anion gap: 9 (ref 5–15)
BUN: 21 mg/dL (ref 8–23)
CO2: 24 mmol/L (ref 22–32)
Calcium: 8.3 mg/dL — ABNORMAL LOW (ref 8.9–10.3)
Chloride: 104 mmol/L (ref 98–111)
Creatinine, Ser: 1.16 mg/dL — ABNORMAL HIGH (ref 0.44–1.00)
GFR, Estimated: 47 mL/min — ABNORMAL LOW (ref >60)
Glucose, Bld: 107 mg/dL — ABNORMAL HIGH (ref 70–99)
Potassium: 4.1 mmol/L (ref 3.5–5.1)
Sodium: 137 mmol/L (ref 135–145)

## 2023-09-13 MED ORDER — AMOXICILLIN 500 MG PO TABS: 2000.0000 mg | ORAL_TABLET | ORAL | 6 refills | Status: DC

## 2023-09-13 NOTE — TOC Transition Note (Signed)
 Transition of Care (TOC) - Discharge Note Sherin Dingwall RN, BSN Transitions of Care Unit 4E- RN Case Manager See Treatment Team for direct phone #   Patient Details  Name: Tara Hardin MRN: [id number] Date of Birth: April 05, 1941  Transition of Care [facility]) CM/SW Contact:  Rox Cope, RN Phone Number: 09/13/2023, 11:30 AM   Clinical Narrative:    Pt stable for transition home today, Provider to resume HHPT as previously ordered.  CM notified [facility] liaison for resumption of HHPT. No further HH or DME needs noted.   Family to transport home   Final next level of care: Home w Home Health Services Barriers to Discharge: Barriers Resolved   Patient Goals and CMS Choice      Resumption of [facility]       Discharge Placement                 Home w/ [facility]      Discharge Plan and Services Additional resources added to the After Visit Summary for     Discharge Planning Services: CM Consult Post Acute Care Choice: Home Health, Resumption of Svcs/PTA Provider                    HH Arranged: PT HH Agency: [facility] Date [facility] Agency Contacted: 09/13/23 Time HH Agency Contacted: 1030 Representative spoke with at [facility] Agency: Angie  Social Drivers of Health (SDOH) Interventions SDOH Screenings   Food Insecurity: Patient Declined (09/12/2023)  Housing: Patient Declined (09/12/2023)  Transportation Needs: Patient Declined (09/12/2023)  Utilities: Patient Declined (09/12/2023)  Social Connections: Patient Declined (09/12/2023)  Tobacco Use: Low Risk  (09/11/2023)     Readmission Risk Interventions    09/13/2023   11:30 AM  Readmission Risk Prevention Plan  Post Dischage Appt Complete  Medication Screening Complete  Transportation Screening Complete

## 2023-09-14 ENCOUNTER — Telehealth: Payer: Self-pay

## 2023-09-14 NOTE — Telephone Encounter (Signed)
 Patient contacted regarding discharge from [facility] on 09/13/2023.  Patient understands to follow up with provider Myrtha Ates on 09/17/2023 at 2:00 PM at [address] location.  Patient understands discharge instructions? yes Patient understands medications and regiment? yes Patient understands to bring all medications to this visit? Yes  The pt is doing well and plans to work with PT at her assisted living facility today.

## 2023-09-17 ENCOUNTER — Inpatient Hospital Stay (INDEPENDENT_AMBULATORY_CARE_PROVIDER_SITE_OTHER)
Admission: RE | Admit: 2023-09-17 | Discharge: 2023-09-17 | Disposition: A | Discharge status: Home / Self Care | Payer: No Typology Code available for payment source | Source: Ambulatory Visit | Attending: Physician Assistant | Admitting: Physician Assistant

## 2023-09-17 ENCOUNTER — Encounter (HOSPITAL_COMMUNITY): Payer: Self-pay | Admitting: Physician Assistant

## 2023-09-17 ENCOUNTER — Ambulatory Visit: Admitting: Physician Assistant

## 2023-09-17 VITALS — BP 110/60 | HR 73 | Ht 64.0 in | Wt 108.8 lb

## 2023-09-17 DIAGNOSIS — I350 Nonrheumatic aortic (valve) stenosis: Secondary | ICD-10-CM

## 2023-09-17 DIAGNOSIS — I4819 Other persistent atrial fibrillation: Secondary | ICD-10-CM

## 2023-09-17 DIAGNOSIS — Z5181 Encounter for therapeutic drug level monitoring: Secondary | ICD-10-CM | POA: Diagnosis not present

## 2023-09-17 DIAGNOSIS — Z79899 Other long term (current) drug therapy: Secondary | ICD-10-CM

## 2023-09-17 DIAGNOSIS — D6869 Other thrombophilia: Secondary | ICD-10-CM

## 2023-09-17 NOTE — Progress Notes (Signed)
 Primary Care Physician: Faustina Hood, MD Primary Cardiologist: Antoinette Batman, MD Electrophysiologist: Boyce Byes, MD  Referring Physician: ED   Tara Hardin is a 83 y.o. female with a history of HFrEF, LBBB, breast cancer, aortic stenosis, atrial fibrillation who presents for follow up in the [facility]. Patient is on Eliquis  for stroke prevention. Her diagnosis of atrial fibrillation dates to June 2024. At that time she was started on Eliquis . Her EF shortly after diagnosis showed an ejection fraction of less than 20% with severe left atrial enlargement and severe aortic stenosis. She presented to heart failure clinic on May 10 2023 markedly volume overloaded. She was admitted to the hospital for IV diuresis. She was started on amiodarone . She was started on milrinone  and diuresed. She underwent a TEE/DCCV on May 15 2023. She was bradycardic post DCCV and EP was consulted. Her digoxin  was discontinued.   Patient returns for follow up for atrial fibrillation and amiodarone  monitoring. 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 She states that she had some neck and shoulder soreness post procedure which has now resolved. She denies any groin tenderness or bleeding. She remains in SR.   Today, she  denies symptoms of palpitations, chest pain, shortness of breath, orthopnea, PND, lower extremity edema, dizziness, presyncope, syncope, snoring, daytime somnolence, bleeding, or neurologic sequela. The patient is tolerating medications without difficulties and is otherwise without complaint today.    Atrial Fibrillation Risk Factors:  she does not have symptoms or diagnosis of sleep apnea. she does not have a history of rheumatic fever.   Atrial Fibrillation Management history:  Previous antiarrhythmic drugs: amiodarone   Previous cardioversions: 05/15/23 Previous ablations: none Anticoagulation history: Eliquis   ROS- All systems are reviewed and negative  except as per the HPI above.  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 Albertha Alosa and Dr. Sherene Dilling.   Severe aortic stenosis     Current Outpatient Medications  Medication Sig Dispense Refill   acetaminophen  (TYLENOL ) 650 MG CR tablet Take 650 mg by mouth every 8 (eight) hours as needed for pain.     amiodarone  (PACERONE ) 200 MG tablet Take 1 tablet (200 mg total) by mouth daily.     amoxicillin  (AMOXIL ) 500 MG tablet Take 4 tablets (2,000 mg total) by mouth as directed. Take 1 hour prior to dental work including cleanings to protect heart valve from becoming infected. 12 tablet 6   apixaban  (ELIQUIS ) 2.5 MG TABS tablet Take 1 tablet (2.5 mg total) by mouth 2 (two) times daily. 60 tablet 5   bismuth  subsalicylate (PEPTO BISMOL) 262 MG/15ML suspension Take 30 mLs by mouth every 8 (eight) hours as needed for indigestion or diarrhea or loose stools.     furosemide  (LASIX ) 20 MG tablet Take 2 tablets (40 mg total) by mouth daily. (Patient taking differently: Take 20 mg by mouth daily.) 60 tablet 11   letrozole  (FEMARA ) 2.5 MG tablet Take 1 tablet (2.5 mg total) by mouth daily. 90 tablet 3   sodium chloride  (OCEAN) 0.65 % nasal spray Place 1 spray into  the nose every 8 (eight) hours as needed (Dry nasal passages).     spironolactone  (ALDACTONE ) 25 MG tablet Take 0.5 tablets (12.5 mg total) by mouth daily. 45 tablet 3   No current facility-administered medications for this encounter.    Physical Exam: BP 110/60   Pulse 73   Ht 5\' 4"  (1.626 m)   Wt 49.4 kg   BMI 18.68 kg/m   GEN: Well nourished,  well developed in no acute distress CARDIAC: Regular rate and rhythm, no rubs, gallops, faint systolic murmur  RESPIRATORY:  Clear to auscultation without rales, wheezing or rhonchi  ABDOMEN: Soft, non-tender, non-distended EXTREMITIES:  No edema; No deformity    Wt Readings from Last 3 Encounters:  09/17/23 49.4 kg  09/13/23 45.7 kg  08/23/23 47.2 kg     EKG today demonstrates SR, LBBB, 1st degree AV block Vent. rate 73 BPM PR interval 320 ms QRS duration 174 ms QT/QTcB 468/515 ms   Echo 04/05/23 demonstrated   1. Spontaneoous echo contrast noted in LV.Aaron Aas Left ventricular ejection  fraction, by estimation, is <20%. The left ventricle has severely  decreased function. The left ventricle has no regional wall motion  abnormalities. Left ventricular diastolic parameters are indeterminate.   2. Right ventricular systolic function is normal. The right ventricular  size is normal.   3. Left atrial size was severely dilated.   4. Right atrial size was moderately dilated.   5. The mitral valve is abnormal. Severe mitral valve regurgitation. No  evidence of mitral stenosis.   6. Severely depressed systolic and degree of MR may underestimate AS.  Clinical correlation suggested.. The aortic valve was not well visualized.  Aortic valve regurgitation is mild. Moderate aortic valve stenosis.   7. The inferior vena cava is normal in size with greater than 50%  respiratory variability, suggesting right atrial pressure of 3 mmHg.   Comparison(s): Echocardiogram done 05/09/22 showed an EF of 45-50% with moderate to severe AS and an AV Mean Grad of 28.7 mmHg.    CHA2DS2-VASc Score = 4  The patient's score is based upon: CHF History: 1 HTN History: 0 Diabetes History: 0 Stroke History: 0 Vascular Disease History: 0 Age Score: 2 Gender Score: 1       ASSESSMENT AND PLAN: Persistent Atrial Fibrillation (ICD10:  I48.19) The patient's CHA2DS2-VASc score is 4, indicating a 4.8% annual  risk of stroke.   Patient appears to be maintaining SR Continue amiodarone  200 mg daily Continue Eliquis  2.5 mg BID (age, weight)  Secondary Hypercoagulable State (ICD10:  D68.69) The patient is at significant risk for stroke/thromboembolism based upon her CHA2DS2-VASc Score of 4.  Continue Apixaban  (Eliquis ). No bleeding issues.  High Risk Medication Monitoring (ICD 10: Z79.899) Intervals on ECG acceptable for amiodarone  monitoring. Check cmet/TSH at next follow up.   Chronic HFrEF EF 25-30% GDMT per [facility] team Fluid status appears stable today  VHD Moderate MR Severe AS s/p TAVR 09/11/23 No groin pain, swelling, or bleeding SBE prophylaxis education provided.  Follow up with Jeronimo Moors as scheduled.     Follow up in the AF clinic in 6 months for amiodarone  monitoring.       Myrtha Ates PA-C [facility], [facility], [facility] [address] [place], [place] [address] [phone]

## 2023-10-01 ENCOUNTER — Ambulatory Visit (HOSPITAL_COMMUNITY)
Admission: RE | Admit: 2023-10-01 | Discharge: 2023-10-01 | Disposition: A | Discharge status: Home / Self Care | Source: Ambulatory Visit | Attending: Internal Medicine | Admitting: Internal Medicine

## 2023-10-01 VITALS — BP 116/64 | HR 72 | Wt 107.2 lb

## 2023-10-01 DIAGNOSIS — I493 Ventricular premature depolarization: Secondary | ICD-10-CM | POA: Diagnosis not present

## 2023-10-01 DIAGNOSIS — Z853 Personal history of malignant neoplasm of breast: Secondary | ICD-10-CM | POA: Insufficient documentation

## 2023-10-01 DIAGNOSIS — I5022 Chronic systolic (congestive) heart failure: Secondary | ICD-10-CM | POA: Insufficient documentation

## 2023-10-01 DIAGNOSIS — I340 Nonrheumatic mitral (valve) insufficiency: Secondary | ICD-10-CM

## 2023-10-01 DIAGNOSIS — I2510 Atherosclerotic heart disease of native coronary artery without angina pectoris: Secondary | ICD-10-CM | POA: Insufficient documentation

## 2023-10-01 DIAGNOSIS — N1832 Chronic kidney disease, stage 3b: Secondary | ICD-10-CM | POA: Diagnosis not present

## 2023-10-01 DIAGNOSIS — I4819 Other persistent atrial fibrillation: Secondary | ICD-10-CM | POA: Diagnosis present

## 2023-10-01 DIAGNOSIS — Z952 Presence of prosthetic heart valve: Secondary | ICD-10-CM | POA: Insufficient documentation

## 2023-10-01 DIAGNOSIS — Z7901 Long term (current) use of anticoagulants: Secondary | ICD-10-CM | POA: Diagnosis not present

## 2023-10-01 DIAGNOSIS — I080 Rheumatic disorders of both mitral and aortic valves: Secondary | ICD-10-CM | POA: Diagnosis not present

## 2023-10-01 DIAGNOSIS — I350 Nonrheumatic aortic (valve) stenosis: Secondary | ICD-10-CM | POA: Diagnosis not present

## 2023-10-01 DIAGNOSIS — I480 Paroxysmal atrial fibrillation: Secondary | ICD-10-CM

## 2023-10-01 DIAGNOSIS — I447 Left bundle-branch block, unspecified: Secondary | ICD-10-CM | POA: Diagnosis not present

## 2023-10-01 DIAGNOSIS — Z79899 Other long term (current) drug therapy: Secondary | ICD-10-CM | POA: Insufficient documentation

## 2023-10-01 NOTE — Progress Notes (Signed)
 [facility]IN[facility]   Primary Care: Faustina Hood, MD Primary Cardiologist: Antoinette Batman, MD HF Cardiologist: Dr. Julane Ny  HPI: Ms. Mapp is an 83 y.o. female with history of persistent AF, breast cancer, aortic stenosis. LBBB andHFrEF.   echo 12/23 EF 50%. Mild MR. Mild to moderate TR. Moderate to severe AS with mean gradient 28 mmHg, AVA 0.94 cm2, SVI 47, DI 0.25.  Plan was to follow her moderate AS with repeat echo in one year.   Developed AF in 6/24 and was started on Eliquis . Developed HF symptoms 02/2023. Echo 04/05/23 EF < 20%. Severe enlargement of the left atrium. Mild to moderate MR. Moderately severe aortic stenosis with mean gradient 29 mmHg, AVA 0.82-0.88cm2, SVI 42, DI 0.32.   She saw Dr. Abel Hoe on 04/17/23 and volume status looked good on lasix  20 daily, but BP too low to start GDMT. She was referred to AHF clinic for further evaluation.  Seen in the AHF Clinic 05/10/23 to establish care. She was severely volume overloaded. Given overload with low BP and fragility, decision was made to admit for IV diuretics. She was diuresed and started on milrinone . She underwent R/LHC which showed mild coronary disease (30% Cx stenosis), elevated right and left filling pressures, pulmonary venous hypertension and good CO on milrinone  0.25. For her AF, she underwent TEE guided DCCV to NSR. She was maintained of low dose amiodarone , digoxin  stopped. TEE confirmed low flow/low gradient aortic stenosis and moderate mitral regurgitation. GDMT was titrated and she was discharged to ALF, weight 104 lbs.  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   Overall feeling fine. Able to walk more and feels stronger. Every now and then says she sees  a "floater" when she is looking at things.  Rarely gets short of breath. Denies PND/Orthopnea. Wants to do some strength training. Using a walker. Appetite ok. No fever or chills. Weight at home 107 pounds. Taking all medications. [facility]  Family history: Brother passed away, had a fib. Sister has an ICD. Sister passed away from CAD.   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 Albertha Alosa and Dr. Sherene Dilling.   Severe aortic stenosis     Current Outpatient Medications  Medication Sig Dispense Refill   acetaminophen  (TYLENOL ) 650 MG CR tablet Take 650 mg by mouth every 8 (eight) hours as needed for pain.     amiodarone  (PACERONE ) 200 MG tablet Take 1 tablet (200 mg total) by mouth daily.     amoxicillin  (AMOXIL ) 500 MG tablet Take 4 tablets (2,000 mg total) by mouth as directed. Take 1 hour prior to dental work including cleanings to protect heart valve from becoming infected. 12 tablet 6   apixaban  (ELIQUIS ) 2.5 MG TABS tablet Take 1 tablet (2.5 mg total) by mouth 2 (two) times daily. 60 tablet 5   bismuth  subsalicylate (PEPTO BISMOL) 262 MG/15ML suspension Take 30 mLs by mouth every 8 (eight) hours as needed for indigestion or diarrhea or loose stools.     furosemide  (LASIX ) 20 MG tablet Take 2 tablets (40 mg total)  by mouth daily. (Patient taking differently: Take 20 mg by mouth daily.) 60 tablet 11   letrozole  (FEMARA ) 2.5 MG tablet Take 1 tablet (2.5 mg total) by mouth daily. 90 tablet 3   sodium chloride  (OCEAN) 0.65 % nasal spray Place 1 spray into the nose every 8 (eight) hours as needed (Dry nasal passages).     spironolactone  (ALDACTONE ) 25 MG tablet Take 0.5 tablets (12.5 mg total) by mouth daily. 45 tablet 3   No current facility-administered medications for this encounter.    No Known Allergies    Social History    Socioeconomic History   Marital status: Married    Spouse name: Not on file   Number of children: 3   Years of education: Not on file   Highest education level: Not on file  Occupational History   Occupation: Retired Information systems manager  Tobacco Use   Smoking status: Never   Smokeless tobacco: Never   Tobacco comments:    Never smoked 06/20/23  Vaping Use   Vaping status: Never Used  Substance and Sexual Activity   Alcohol use: Not Currently   Drug use: Never   Sexual activity: Not Currently    Birth control/protection: Post-menopausal  Other Topics Concern   Not on file  Social History Narrative   Not on file   Social Drivers of Health   Financial Resource Strain: Not on file  Food Insecurity: Patient Declined (09/12/2023)   Hunger Vital Sign    Worried About Running Out of Food in the Last Year: Patient declined    Ran Out of Food in the Last Year: Patient declined  Transportation Needs: Patient Declined (09/12/2023)   PRAPARE - Administrator, Civil Service (Medical): Patient declined    Lack of Transportation (Non-Medical): Patient declined  Physical Activity: Not on file  Stress: Not on file  Social Connections: Patient Declined (09/12/2023)   Social Connection and Isolation Panel [NHANES]    Frequency of Communication with Friends and Family: Patient declined    Frequency of Social Gatherings with Friends and Family: Patient declined    Attends Religious Services: Patient declined    Database administrator or Organizations: Patient declined    Attends Banker Meetings: Patient declined    Marital Status: Patient declined  Intimate Partner Violence: Patient Declined (09/12/2023)   Humiliation, Afraid, Rape, and Kick questionnaire    Fear of Current or Ex-Partner: Patient declined    Emotionally Abused: Patient declined    Physically Abused: Patient declined    Sexually Abused: Patient declined      Family History  Problem Relation Age of  Onset   Lung cancer Mother        d. 86   Heart attack Sister    Hypertension Sister    Atrial fibrillation Brother    Hypertension Brother    Heart attack Brother    Irregular heart beat Brother    Breast cancer Paternal Aunt        dx after 27   Breast cancer Cousin        paternal female cousin   BP 116/64   Pulse 72   Wt 48.6 kg (107 lb 3.2 oz)   SpO2 97%   BMI 18.40 kg/m   Wt Readings from Last 3 Encounters:  10/01/23 48.6 kg (107 lb 3.2 oz)  09/17/23 49.4 kg (108 lb 12.8 oz)  09/13/23 45.7 kg (100 lb 12 oz)    PHYSICAL EXAM: General:  No resp difficulty Neck: supple. no JVD.  Cor: PMI nondisplaced. Regular rate & rhythm. No rubs, gallops or murmurs. Lungs: clear Abdomen: soft, nontender, nondistended.  Extremities: no cyanosis, clubbing, rash, edema Neuro: alert & oriented x3  ASSESSMENT & PLAN: 1. Chronic systolic CHF:  - Echo in 12/23 with EF 50%, moderate-severe AS.   - Developed atrial fibrillation in 6/24 but rate has been controlled.  She has LBBB - Echo 11/24 shows EF 20-25% with septal-lateral dyssynchrony consistent with LBBB, mild RV dysfunction, possible low flow/low gradient severe AS, mild to mod MR - Cause of drop in EF is uncertain.  AS does not appear critical but think severe (see below).  No significant CAD on cath, => NICM - Echo 09/12/23- LVEF 25-30%  - - GDMT limited by soft BP today - Continue Lasix  20 mg daily. - Continue spironolactone  12.5 mg daily. - Hold off on SGLT2i with frailty. - No beta blocker yet - Off dig with recent bradycardia  2. Atrial fibrillation: - Persistent since at least 6/24.  - She has a severely dilated LA, doubt she will hold NSR without anti-arrhythmic.  - s/p TEE-guided DCCV 12/24 to NSR - Per EP she is not a candidate for CRT-D with frailty, LV dilation and body mass.  - Off digoxin  with bradycardia - Continue amiodarone  200 mg daily. - Continue Eliquis  2.5 mg bid.    3. Aortic stenosis:  - Suspected  low flow/low gradient severe AS with mean gradient 34 mmHg and AVA 0.83.  TEE confirmed low flow/low gradient severe AS.  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 Mitral regurgitation:  - Moderate on TEE 12/24 - HF optimization as above  5. PVCs: - Occasionally on telemetry during admission - She denies palp  - Continue amiodarone   6. CKD IIIb:  - baseline Scr 1.2-1.4 - Follow renal function with addition of MRA - BMET today.   Follow up in 3 months    Amy Clegg NP-C  10/01/23  Patient seen and examined with the above-signed Advanced Practice Provider and/or Housestaff. I personally reviewed laboratory data, imaging studies and relevant notes. I independently examined the patient and formulated the important aspects of the plan. I have edited the note to reflect any of my changes or salient points. I have personally discussed the plan with the patient and/or family.  She is doing well post-TAVR on 09/12/23. More active and asking if she can do more exercise. Remains in NSR. Denies CP or SOB  General:  Elderly frail. No resp difficulty HEENT: normal Neck: supple. no JVD. Carotids 2+ bilat; no bruits. No lymphadenopathy or thryomegaly appreciated. Cor: Regular rate & rhythm. No rubs, gallops or murmurs. Lungs: clear Abdomen: soft, nontender, nondistended. No hepatosplenomegaly. No bruits or masses. Good bowel sounds. Extremities: no cyanosis, clubbing, rash, edema Neuro: alert & orientedx3, cranial nerves grossly intact. moves all 4 extremities w/o difficulty. Affect pleasant  She is doing very well post-TAVR. Remains in NSR on po amio. No bleeding with Eliquis  at adjusted dose. Continue current GDMT.   Will get f/u echo at Structural visit next month.   Jules Oar, MD  5:38 PM

## 2023-10-01 NOTE — Patient Instructions (Signed)
 No Labs done today.   Please schedule an eye exam.   No medication changes were made. Please continue all current medications as prescribed.  Your physician recommends that you schedule a follow-up appointment in: 3 months. Please contact our office in July 2025 to schedule a August 2025 appointment.   If you have any questions or concerns before your next appointment please send us  a message through [place] or call our office at [phone].    TO LEAVE A MESSAGE FOR THE NURSE SELECT OPTION 2, PLEASE LEAVE A MESSAGE INCLUDING: YOUR NAME DATE OF BIRTH CALL BACK NUMBER REASON FOR CALL**this is important as we prioritize the call backs  YOU WILL RECEIVE A CALL BACK THE SAME DAY AS LONG AS YOU CALL BEFORE 4:00 PM   Do the following things EVERYDAY: Weigh yourself in the morning before breakfast. Write it down and keep it in a log. Take your medicines as prescribed Eat low salt foods--Limit salt (sodium) to 2000 mg per day.  Stay as active as you can everyday Limit all fluids for the day to less than 2 liters   At the [facility], you and your health needs are our priority. As part of our continuing mission to provide you with exceptional heart care, we have created designated Provider Care Teams. These Care Teams include your primary Cardiologist (physician) and Advanced Practice Providers (APPs- Physician Assistants and Nurse Practitioners) who all work together to provide you with the care you need, when you need it.   You may see any of the following providers on your designated Care Team at your next follow up: Dr Jules Oar Dr Peder Bourdon Dr. Mimi Alt, NP Ruddy Corral, [place] [facility] [place], [place] Dennise Fitz, NP Luster Salters, PharmD   Please be sure to bring in all your medications bottles to every appointment.    Thank you for choosing [facility]

## 2023-10-18 ENCOUNTER — Ambulatory Visit: Attending: Physician Assistant | Admitting: Physician Assistant

## 2023-10-18 ENCOUNTER — Encounter: Payer: Self-pay | Admitting: Physician Assistant

## 2023-10-18 ENCOUNTER — Ambulatory Visit (HOSPITAL_COMMUNITY): Admit: 2023-10-18

## 2023-10-18 ENCOUNTER — Other Ambulatory Visit (HOSPITAL_COMMUNITY): Payer: Self-pay | Admitting: Physician Assistant

## 2023-10-18 VITALS — BP 110/70 | HR 67 | Ht 64.0 in | Wt 105.0 lb

## 2023-10-18 DIAGNOSIS — I5022 Chronic systolic (congestive) heart failure: Secondary | ICD-10-CM | POA: Diagnosis not present

## 2023-10-18 DIAGNOSIS — I428 Other cardiomyopathies: Secondary | ICD-10-CM | POA: Diagnosis not present

## 2023-10-18 DIAGNOSIS — R54 Age-related physical debility: Secondary | ICD-10-CM

## 2023-10-18 DIAGNOSIS — I4819 Other persistent atrial fibrillation: Secondary | ICD-10-CM

## 2023-10-18 DIAGNOSIS — R911 Solitary pulmonary nodule: Secondary | ICD-10-CM

## 2023-10-18 DIAGNOSIS — Z952 Presence of prosthetic heart valve: Secondary | ICD-10-CM

## 2023-10-18 NOTE — Progress Notes (Signed)
 [facility]ENTER   MULTIDISCIPLINARY HEART VALVE CLINIC                                     Cardiology Office Note:    Date:  10/18/2023   ID:  Tara Hardin, DOB [birth year], MRN [id number]  PCP:  Tara Hood, MD  [facility] Cardiologist:  Tara Batman, MD  [facility] Structural heart: Tara Batman, MD [facility] Electrophysiologist:  Tara Byes, MD   Referring MD: Tara Hood, MD   1 month s/p TAVR  History of Present Illness:    Tara Hardin is a 83 y.o. 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   She has been followed by Dr. Abel Hoe for aortic stenosis. Echo in 04/2022 showed EF 50% and moderate-severe AS, mild MR, mild-mod TR. She developed atrial fibrillation and heart failure in 10/2022. Echo 04/05/23 showed dramatic drop in EF <20%, severe LAE, severe MR and mod-severe AS. She was eventually referred to the advanced CHF clinic and seen by Dr. Julane Ny. She was admitted at the end of 04/2023 for acute CHF requiring milrinone . She underwent [facility] 05/14/23 which showed mild coronary disease (30% Cx stenosis), elevated right and left filling pressures, pulmonary venous hypertension and good CO on milrinone  0.25. S/p TEE guided DCCV to NSR on 05/15/23. Seen by EP for reduced EF and LBBB, but turned down for CRT-D given frailty, LV dilation, low body mass. Diuresed with IV lasix  and started on GDMT (limited by hypotension and bradycardia) and discharged to an ILF. She has improved on medical therapy but still complained of DOE and fatigue. S/p successful TAVR with a 23 mm Edwards Sapien 3 Ultra Resilia THV via the TF approach on 09/11/23. Post operative echo showed EF 25%, normally  functioning TAVR with a mean gradient of 10 mmHg and mild to mod PVL as well as mild MR. LVEDP 23 mm hg at the time of TAVR and treated with IV lasix .   Today the patient presents to clinic for follow up. Here alone. She went to the wrong office and missed her echo. She is doing well with no issues. Has seen some floaters in her eyes after TAVR and made an apt with her ophthalmologist. No CP or SOB. No LE edema, orthopnea or PND. No dizziness or syncope. No blood in stool or urine. No palpitations. Feels fatigued but getting more energy as she becomes more active.   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 Albertha Hardin and Dr. Sherene Hardin.   Severe aortic stenosis      Current Medications: Current Meds  Medication Sig   amiodarone  (PACERONE ) 200 MG tablet Take 1 tablet (200 mg total) by mouth daily.   amoxicillin  (AMOXIL ) 500  MG tablet Take 4 tablets (2,000 mg total) by mouth as directed. Take 1 hour prior to dental work including cleanings to protect heart valve from becoming infected.   apixaban  (ELIQUIS ) 2.5 MG TABS tablet Take 1 tablet (2.5 mg total) by mouth 2 (two) times daily.   bismuth  subsalicylate (PEPTO BISMOL) 262 MG/15ML suspension Take 30 mLs by mouth every 8 (eight) hours as needed for indigestion or diarrhea or loose stools.   furosemide  (LASIX ) 20 MG tablet Take 2 tablets (40 mg total) by mouth daily. (Patient taking differently: Take 20 mg by mouth daily.)   letrozole  (FEMARA ) 2.5 MG tablet Take 1 tablet (2.5 mg total) by mouth daily.   Red Yeast Rice Extract 600 MG CAPS Take by mouth.    sodium chloride  (OCEAN) 0.65 % nasal spray Place 1 spray into the nose every 8 (eight) hours as needed (Dry nasal passages).   spironolactone  (ALDACTONE ) 25 MG tablet Take 0.5 tablets (12.5 mg total) by mouth daily.   [DISCONTINUED] acetaminophen  (TYLENOL ) 650 MG CR tablet Take 650 mg by mouth every 8 (eight) hours as needed for pain.      ROS:   Please see the history of present illness.    All other systems reviewed and are negative.  EKGs       Risk Assessment/Calculations:    CHA2DS2-VASc Score = 4   This indicates a 4.8% annual risk of stroke. The patient's score is based upon: CHF History: 1 HTN History: 0 Diabetes History: 0 Stroke History: 0 Vascular Disease History: 0 Age Score: 2 Gender Score: 1          Physical Exam:    VS:  BP 110/70   Pulse 67   Ht 5\' 4"  (1.626 m)   Wt 105 lb (47.6 kg)   SpO2 99%   BMI 18.02 kg/m     Wt Readings from Last 3 Encounters:  10/18/23 105 lb (47.6 kg)  10/01/23 107 lb 3.2 oz (48.6 kg)  09/17/23 108 lb 12.8 oz (49.4 kg)     GEN: thin, frail white woman.  NECK: No JVD CARDIAC: RRR, no murmurs, rubs, gallops RESPIRATORY:  Clear to auscultation without rales, wheezing or rhonchi  ABDOMEN: Soft, non-tender, non-distended EXTREMITIES:  No edema; No deformity.   ASSESSMENT:    1. S/P TAVR (transcatheter aortic valve replacement)   2. NICM (nonischemic cardiomyopathy) (HCC)   3. Chronic systolic heart failure (HCC)   4. Persistent atrial fibrillation (HCC)   5. Frailty   6. Pulmonary nodule     PLAN:    In order of problems listed above:  Severe AS s/p TAVR:  -- Missed her echo today, r/s for 7/15 in [facility].  -- NYHA class II symptoms.  -- Continue Eliquis  2.5mg  BID. Aaron Aas  -- SBE prophylaxis discussed; she has RX'd amoxicillin . -- Encouraged CRP2 participation to build back stamina.  -- I will see back for 1 year office visit with echo.   NICM -- EF 25-30% -- Turned down for CRT-D due to frailty. --  GDMT limited by hypotension and bradycardia.   Chronic HFrEF: -- Appears euvolemic.  -- Continue spiro 12.5mg  daily and Lasix  20mg  daily.   Persistent atrial fibrillation: -- Maintaining sinus since DCCV in 04/2023. -- Continue amiodarone  200mg  daily.  -- Continue Eliquis  2.5mg  BID. Aaron Aas    Frailty: -- Body mass index is 17.29 kg/m. -- Continue good nutrition.    Pulmonary nodule: -- Pre TAVR CTs showed an 8 mm nodule with pleural  stranding in the anterior apex of the left upper lobe. Non-contrast chest CT at 6-12 months is recommended. If the nodule is stable at time of repeat CT, then future CT at 18-24 months (from today's scan) is considered optional for low-risk patients, but is recommended for high-risk patients. -- Due to history of breast cancer, I will send her to the pulmonary nodule clinic for follow up over time.      Cardiac Rehabilitation Eligibility Assessment  The patient is ready to start cardiac rehabilitation from a cardiac standpoint.     Medication Adjustments/Labs and Tests Ordered: Current medicines are reviewed at length with the patient today.  Concerns regarding medicines are outlined above.  Orders Placed This Encounter  Procedures   Ambulatory referral to Pulmonology   No orders of the defined types were placed in this encounter.   Patient Instructions  Medication Instructions:  Your physician recommends that you continue on your current medications as directed. Please refer to the Current Medication list given to you today.  *If you need a refill on your cardiac medications before your next appointment, please call your pharmacy*  Lab Work: NONE If you have labs (blood work) drawn today and your tests are completely normal, you will receive your results only by: MyChart Message (if you have MyChart) OR A paper copy in the mail If you have any lab test that is abnormal or we need to change your treatment, we will call you to review the  results.  Testing/Procedures: NONE  Follow-Up: At [facility], you and your health needs are our priority.  As part of our continuing mission to provide you with exceptional heart care, our providers are all part of one team.  This team includes your primary Cardiologist (physician) and Advanced Practice Providers or APPs (Physician Assistants and Nurse Practitioners) who all work together to provide you with the care you need, when you need it.  Your next appointment:   KEEP SCHEDULED FOLLOW-UP  We recommend signing up for the patient portal called "MyChart".  Sign up information is provided on this After Visit Summary.  MyChart is used to connect with patients for Virtual Visits (Telemedicine).  Patients are able to view lab/test results, encounter notes, upcoming appointments, etc.  Non-urgent messages can be sent to your provider as well.   To learn more about what you can do with MyChart, go to [email or web address].          Signed, Abagail Hoar, PA-C  10/18/2023 5:03 PM     [facility]

## 2023-10-18 NOTE — Patient Instructions (Signed)
 Medication Instructions:  Your physician recommends that you continue on your current medications as directed. Please refer to the Current Medication list given to you today.  *If you need a refill on your cardiac medications before your next appointment, please call your pharmacy*  Lab Work: NONE If you have labs (blood work) drawn today and your tests are completely normal, you will receive your results only by: MyChart Message (if you have MyChart) OR A paper copy in the mail If you have any lab test that is abnormal or we need to change your treatment, we will call you to review the results.  Testing/Procedures: NONE  Follow-Up: At [facility], you and your health needs are our priority.  As part of our continuing mission to provide you with exceptional heart care, our providers are all part of one team.  This team includes your primary Cardiologist (physician) and Advanced Practice Providers or APPs (Physician Assistants and Nurse Practitioners) who all work together to provide you with the care you need, when you need it.  Your next appointment:   KEEP SCHEDULED FOLLOW-UP  We recommend signing up for the patient portal called "MyChart".  Sign up information is provided on this After Visit Summary.  MyChart is used to connect with patients for Virtual Visits (Telemedicine).  Patients are able to view lab/test results, encounter notes, upcoming appointments, etc.  Non-urgent messages can be sent to your provider as well.   To learn more about what you can do with MyChart, go to [email or web address].

## 2023-10-23 ENCOUNTER — Encounter (HOSPITAL_COMMUNITY): Payer: Self-pay

## 2023-10-23 ENCOUNTER — Telehealth (HOSPITAL_COMMUNITY): Payer: Self-pay

## 2023-10-23 NOTE — Telephone Encounter (Signed)
 Attempted to call patient in regards to Cardiac Rehab - LM on VM Mailed letter

## 2023-10-24 ENCOUNTER — Other Ambulatory Visit: Payer: Self-pay | Admitting: Physician Assistant

## 2023-10-24 ENCOUNTER — Telehealth: Payer: Self-pay | Admitting: Cardiovascular Disease

## 2023-10-24 ENCOUNTER — Other Ambulatory Visit

## 2023-10-24 DIAGNOSIS — R55 Syncope and collapse: Secondary | ICD-10-CM

## 2023-10-24 NOTE — Telephone Encounter (Signed)
 The pt's daughter called into the office and states that last night she was speaking to the patient on the phone and the pt seemed to pass out for about 15 seconds.  When the patient came to she had normal neurologic function per daughter and could answer questions.     I spoke with the pt and last night she was sitting in a chair speaking with her daughter on the phone.  She said that her vision became blurry and seemed to darken before she passed out.  When the pt came to she did not notice any symptoms.  The pt did not lose bowel or bladder function during this episode.  The pt states that yesterday she worked in her vegetable garden for 2 hours and did a lot of bending over.  She ate a snack and drank an ensure.  The pt wonders if spell may have been related to dehydration or not eating enough.  Will discuss with Jeronimo Moors PA-C.

## 2023-10-24 NOTE — Telephone Encounter (Signed)
 New Message:      Patient's daughter would like for Lauren to please call her.

## 2023-10-24 NOTE — Progress Notes (Unsigned)
Enrolled patient for a 14 day Zio AT monitor to be mailed to patients home  [place] to read

## 2023-10-30 ENCOUNTER — Other Ambulatory Visit: Payer: Self-pay

## 2023-10-30 MED ORDER — AMIODARONE HCL 200 MG PO TABS: 200.0000 mg | ORAL_TABLET | Freq: Every day | ORAL | 3 refills | Status: DC

## 2023-10-31 ENCOUNTER — Other Ambulatory Visit: Payer: Self-pay

## 2023-10-31 ENCOUNTER — Telehealth: Payer: Self-pay | Admitting: Cardiovascular Disease

## 2023-10-31 MED ORDER — AMIODARONE HCL 200 MG PO TABS: 200.0000 mg | ORAL_TABLET | Freq: Every day | ORAL | 1 refills | Status: DC

## 2023-10-31 NOTE — Telephone Encounter (Signed)
 Patient calling the office for samples of medication:   1.  What medication and dosage are you requesting samples for? amiodarone  (PACERONE ) 200 MG tablet   apixaban  (ELIQUIS ) 2.5 MG TABS tablet   2.  Are you currently out of this medication?    2 days She is having problems with her mail order

## 2023-10-31 NOTE — Telephone Encounter (Signed)
-----   Message from Abagail Hoar sent at 10/31/2023  4:39 PM EDT ----- Regarding: call pt Can someone please call pt? She has questions about samples. Also please give her the main cardiology line to call for these types of questions.  Thank you! Katie

## 2023-10-31 NOTE — Telephone Encounter (Signed)
 Called and spoke to pt regarding her inquiry on Samples for Eliquis  and Amiodarone . She states her RX plan has changed and it is separate from her medical plan. It will take a few days to transition (to [employer]  new plan) and she is only taking Eliquis  once daily because she is running low. Advised her to take as prescribed (BID) and we would hopefully be able to have samples for her. She will be able to have someone bring her to the [place] location to pick them up.   Will route to Pharm D and samples pool. Advised pt to call [phone] and ask to speak to a Triage nurse if she has not heard anything by afternoon of 11/01/23.

## 2023-11-01 ENCOUNTER — Other Ambulatory Visit: Payer: Self-pay | Admitting: *Deleted

## 2023-11-01 ENCOUNTER — Telehealth: Payer: Self-pay | Admitting: Cardiovascular Disease

## 2023-11-01 MED ORDER — LETROZOLE 2.5 MG PO TABS
2.5000 mg | ORAL_TABLET | Freq: Every day | ORAL | 3 refills | Status: AC
Start: 2023-11-01 — End: ?

## 2023-11-01 MED ORDER — APIXABAN 2.5 MG PO TABS: 2.5000 mg | ORAL_TABLET | Freq: Two times a day (BID) | ORAL | 0 refills | Status: DC

## 2023-11-01 NOTE — Telephone Encounter (Signed)
 Ok, we will prepare Eliquis  2.5 mg twice daily for a week for her.

## 2023-11-01 NOTE — Telephone Encounter (Signed)
 This was in Pt calls. Please advise.

## 2023-11-01 NOTE — Telephone Encounter (Signed)
 Pt requesting two weeks worth of samples. Please advise

## 2023-11-01 NOTE — Telephone Encounter (Signed)
*  STAT* If patient is at the pharmacy, call can be transferred to refill team.   1. Which medications need to be refilled? (please list name of each medication and dose if known) amiodarone  (PACERONE ) 200 MG tablet   2. Which pharmacy/location (including street and city if local pharmacy) is medication to be sent to? [facility] - [place], [place] - [address]    3. Do they need a 30 day or 90 day supply? 90 only has 2 left

## 2023-11-02 ENCOUNTER — Other Ambulatory Visit (HOSPITAL_COMMUNITY): Payer: Self-pay

## 2023-11-02 ENCOUNTER — Telehealth: Payer: Self-pay | Admitting: Pharmacy Technician

## 2023-11-02 MED ORDER — AMIODARONE HCL 200 MG PO TABS: 200.0000 mg | ORAL_TABLET | Freq: Every day | ORAL | 3 refills | Status: DC

## 2023-11-02 NOTE — Telephone Encounter (Signed)
 PAP: Patient assistance application for Eliquis  through [employer] ([employer]) has been mailed to pt's home address on file. Provider portion of application will be faxed to provider's office.  Will upload provider portion to media

## 2023-11-02 NOTE — Telephone Encounter (Signed)
 Pt's medication was sent to pt's pharmacy as requested. Confirmation received.

## 2023-11-02 NOTE — Telephone Encounter (Signed)
 Pt calling for a update. Please advise

## 2023-11-02 NOTE — Telephone Encounter (Signed)
 Returned call to patient.  I let her know the one week of samples is ready for pick up.  Adv to please take twice a day for effectiveness and she voices understanding.  She has different prescription insurance since her husband passed away and now cannot use the copay card.  The cost is around $200/mon.  I gave her the number for patient assistance w [employer].  She will call to see if she may be eligible.  Since she's applying, I adv she would be eligible for a second week of samples that we can get ready for her today.  Her son in law should be able to pick them up today for her.  She only has 4 tablets left.  Pt grateful for assistance.

## 2023-11-05 ENCOUNTER — Telehealth: Payer: Self-pay | Admitting: Cardiovascular Disease

## 2023-11-05 NOTE — Telephone Encounter (Signed)
 New Message:      Please call, question about wearing her Monitor.

## 2023-11-05 NOTE — Telephone Encounter (Signed)
 Patient identification verified by 2 forms. Tara Ellen, RN     Called and spoke to patient  Patient states:  - She has not put the monitor on yet. She was worried about it sweating off and it getting wet.    Interventions/Plan: - Reviewed monitor instructions again with patient. \   ZIO AT Long term monitor-Live Telemetry  Your physician has requested you wear a ZIO patch monitor for 14 days.  This is a single patch monitor. [employer] supplies one patch monitor per enrollment. Additional  stickers are not available.  Please do not apply patch if you will be having a Nuclear Stress Test, Echocardiogram, Cardiac CT, MRI,  or Chest Xray during the period you would be wearing the monitor. The patch cannot be worn during  these tests. You cannot remove and re-apply the ZIO AT patch monitor.  Your ZIO patch monitor will be mailed 3 day USPS to your address on file. It may take 3-5 days to  receive your monitor after you have been enrolled.  Once you have received your monitor, please review the enclosed instructions. Your monitor has  already been registered assigning a specific monitor serial # to you.   Billing and Patient Assistance Program information  [place] has been supplied with any insurance information on record for billing. [employer] offers a sliding scale Patient Assistance Program for patients without insurance, or whose  insurance does not completely cover the cost of the ZIO patch monitor. You must apply for the  Patient Assistance Program to qualify for the discounted rate. To apply, call [employer] at [phone],  select option 4, select option 2 , ask to apply for the Patient Assistance Program, (you can request an  interpreter if needed). [employer] will ask your household income and how many people are in your  household. [employer] will quote your out-of-pocket cost based on this information. They will also be able  to set up a 12 month interest free payment plan if  needed.  Applying the monitor   Shave hair from upper left chest.  Hold the abrader disc by orange tab. Rub the abrader in 40 strokes over left upper chest as indicated in  your monitor instructions.  Clean area with 4 enclosed alcohol pads. Use all pads to ensure the area is cleaned thoroughly. Let  dry.  Apply patch as indicated in monitor instructions. Patch will be placed under collarbone on left side of  chest with arrow pointing upward.  Rub patch adhesive wings for 2 minutes. Remove the white label marked 1. Remove the white label  marked 2. Rub patch adhesive wings for 2 additional minutes.  While looking in a mirror, press and release button in center of patch. A small green light will flash 3-4  times. This will be your only indicator that the monitor has been turned on.  Do not shower for the first 24 hours. You may shower after the first 24 hours.  Press the button if you feel a symptom. You will hear a small click. Record Date, Time and Symptom in  the Patient Log.   Starting the Gateway  In your kit there is a Audiological scientist box the size of a cellphone. This is Buyer, retail. It transmits all your  recorded data to [facility]. This box must always stay within 10 feet of you. Open the box and push the *  button. There will be a light that blinks orange and then green a few times. When the light  stops  blinking, the Gateway is connected to the ZIO patch. Call [employer] at [phone] to confirm your monitor is transmitting.  Returning your monitor  Remove your patch and place it inside the Gateway. In the lower half of the Gateway there is a white  bag with prepaid postage on it. Place Gateway in bag and seal. Mail package back to [place] as soon as  possible. Your physician should have your final report approximately 7 days after you have mailed back  your monitor. Call [facility] Customer Care at [phone] if you have questions regarding your ZIO AT  patch  monitor. Call them immediately if you see an orange light blinking on your monitor.  If your monitor falls off in less than 4 days, contact our Monitor department at [phone]. If your  monitor becomes loose or falls off after 4 days call [employer] at [phone] for suggestions on  securing your monitor    Patient verbalized understanding, no further questions at this time

## 2023-11-08 ENCOUNTER — Telehealth (HOSPITAL_COMMUNITY): Payer: Self-pay

## 2023-11-08 ENCOUNTER — Other Ambulatory Visit (HOSPITAL_COMMUNITY): Payer: Self-pay | Admitting: Physician Assistant

## 2023-11-08 DIAGNOSIS — I359 Nonrheumatic aortic valve disorder, unspecified: Secondary | ICD-10-CM

## 2023-11-08 DIAGNOSIS — Z952 Presence of prosthetic heart valve: Secondary | ICD-10-CM

## 2023-11-08 NOTE — Telephone Encounter (Signed)
 Spoke to patient regarding cardiac rehab. She states she is having issues with her eyes and is not sure about her ability to drive anywhere, and was unsure whether she would like to come to [facility] or [facility] if she did decide to do it. Patient asked if Medicare A would cover her rehab and I informed her it would not but we could do an Engineer, manufacturing systems with her [facility] insurance on file.  Patient stated she will call us  back if she changes her mind but agreed to close the referral for now. Closing referral.

## 2023-11-09 ENCOUNTER — Other Ambulatory Visit: Payer: Self-pay | Admitting: Cardiovascular Disease

## 2023-11-09 DIAGNOSIS — I4819 Other persistent atrial fibrillation: Secondary | ICD-10-CM

## 2023-11-09 NOTE — Telephone Encounter (Signed)
 Prescription refill request for Eliquis  received. Indication: Afib  Last office visit: 10/18/23 Tara Hardin)  Scr: 1.16 (09/13/23)  Age: 83 Weight: 47.6kg  Appropriate dose. Refill sent.

## 2023-11-12 NOTE — Telephone Encounter (Signed)
 I spoke to the patient and she said she got the paperwork and is going to fill it out and bring in tomorrow. She is actually out of eliquis  and took half of a tablet this morning to get through today. Can she have any samples until we get the decision from the assistance? She could pick up tomorrow

## 2023-11-12 NOTE — Telephone Encounter (Signed)
 Please let me know if we have to prepare Eliquis  samples for her.

## 2023-11-27 ENCOUNTER — Ambulatory Visit (HOSPITAL_BASED_OUTPATIENT_CLINIC_OR_DEPARTMENT_OTHER)
Admission: RE | Admit: 2023-11-27 | Discharge: 2023-11-27 | Disposition: A | Discharge status: Home / Self Care | Source: Ambulatory Visit | Attending: Physician Assistant | Admitting: Physician Assistant

## 2023-11-27 ENCOUNTER — Ambulatory Visit (HOSPITAL_BASED_OUTPATIENT_CLINIC_OR_DEPARTMENT_OTHER)

## 2023-11-27 DIAGNOSIS — I359 Nonrheumatic aortic valve disorder, unspecified: Secondary | ICD-10-CM | POA: Insufficient documentation

## 2023-11-30 ENCOUNTER — Ambulatory Visit: Payer: Self-pay | Admitting: Physician Assistant

## 2023-11-30 LAB — ECHOCARDIOGRAM COMPLETE
AR max vel: 1.99 cm2
AV Area VTI: 1.74 cm2
AV Area mean vel: 1.88 cm2
AV Mean grad: 8.8 mmHg
AV Peak grad: 16.6 mmHg
Ao pk vel: 2.04 m/s
Area-P 1/2: 6.04 cm2
Calc EF: 35 %
S' Lateral: 4.9 cm
Single Plane A2C EF: 32.4 %
Single Plane A4C EF: 35 %

## 2023-12-02 ENCOUNTER — Emergency Department (HOSPITAL_COMMUNITY)

## 2023-12-02 ENCOUNTER — Other Ambulatory Visit: Payer: Self-pay

## 2023-12-02 ENCOUNTER — Inpatient Hospital Stay (HOSPITAL_COMMUNITY)
Admission: EM | Admit: 2023-12-02 | Discharge: 2023-12-04 | DRG: 243 | Disposition: A | Discharge status: Home / Self Care | Attending: Internal Medicine | Admitting: Internal Medicine

## 2023-12-02 ENCOUNTER — Encounter (HOSPITAL_COMMUNITY): Payer: Self-pay

## 2023-12-02 DIAGNOSIS — I5022 Chronic systolic (congestive) heart failure: Secondary | ICD-10-CM | POA: Diagnosis present

## 2023-12-02 DIAGNOSIS — N1832 Chronic kidney disease, stage 3b: Secondary | ICD-10-CM | POA: Diagnosis present

## 2023-12-02 DIAGNOSIS — I480 Paroxysmal atrial fibrillation: Secondary | ICD-10-CM | POA: Diagnosis present

## 2023-12-02 DIAGNOSIS — Z79899 Other long term (current) drug therapy: Secondary | ICD-10-CM | POA: Diagnosis not present

## 2023-12-02 DIAGNOSIS — I442 Atrioventricular block, complete: Secondary | ICD-10-CM

## 2023-12-02 DIAGNOSIS — I428 Other cardiomyopathies: Secondary | ICD-10-CM | POA: Diagnosis present

## 2023-12-02 DIAGNOSIS — Z79811 Long term (current) use of aromatase inhibitors: Secondary | ICD-10-CM

## 2023-12-02 DIAGNOSIS — Z7901 Long term (current) use of anticoagulants: Secondary | ICD-10-CM

## 2023-12-02 DIAGNOSIS — R55 Syncope and collapse: Principal | ICD-10-CM | POA: Diagnosis present

## 2023-12-02 DIAGNOSIS — I447 Left bundle-branch block, unspecified: Secondary | ICD-10-CM | POA: Diagnosis present

## 2023-12-02 DIAGNOSIS — Z8249 Family history of ischemic heart disease and other diseases of the circulatory system: Secondary | ICD-10-CM

## 2023-12-02 DIAGNOSIS — E861 Hypovolemia: Secondary | ICD-10-CM | POA: Diagnosis present

## 2023-12-02 DIAGNOSIS — I441 Atrioventricular block, second degree: Principal | ICD-10-CM | POA: Diagnosis present

## 2023-12-02 DIAGNOSIS — Z953 Presence of xenogenic heart valve: Secondary | ICD-10-CM

## 2023-12-02 DIAGNOSIS — I495 Sick sinus syndrome: Secondary | ICD-10-CM | POA: Diagnosis present

## 2023-12-02 DIAGNOSIS — E7800 Pure hypercholesterolemia, unspecified: Secondary | ICD-10-CM | POA: Diagnosis present

## 2023-12-02 DIAGNOSIS — I080 Rheumatic disorders of both mitral and aortic valves: Secondary | ICD-10-CM | POA: Diagnosis present

## 2023-12-02 DIAGNOSIS — I459 Conduction disorder, unspecified: Secondary | ICD-10-CM | POA: Diagnosis not present

## 2023-12-02 DIAGNOSIS — I5043 Acute on chronic combined systolic (congestive) and diastolic (congestive) heart failure: Secondary | ICD-10-CM | POA: Diagnosis not present

## 2023-12-02 DIAGNOSIS — R001 Bradycardia, unspecified: Secondary | ICD-10-CM | POA: Diagnosis not present

## 2023-12-02 LAB — COMPREHENSIVE METABOLIC PANEL WITH GFR
ALT: 21 U/L (ref 0–44)
AST: 35 U/L (ref 15–41)
Albumin: 3.4 g/dL — ABNORMAL LOW (ref 3.5–5.0)
Alkaline Phosphatase: 74 U/L (ref 38–126)
Anion gap: 9 (ref 5–15)
BUN: 26 mg/dL — ABNORMAL HIGH (ref 8–23)
CO2: 23 mmol/L (ref 22–32)
Calcium: 8.5 mg/dL — ABNORMAL LOW (ref 8.9–10.3)
Chloride: 106 mmol/L (ref 98–111)
Creatinine, Ser: 1.59 mg/dL — ABNORMAL HIGH (ref 0.44–1.00)
GFR, Estimated: 32 mL/min — ABNORMAL LOW (ref >60)
Glucose, Bld: 108 mg/dL — ABNORMAL HIGH (ref 70–99)
Potassium: 4.2 mmol/L (ref 3.5–5.1)
Sodium: 138 mmol/L (ref 135–145)
Total Bilirubin: 0.7 mg/dL (ref 0.0–1.2)
Total Protein: 6.6 g/dL (ref 6.5–8.1)

## 2023-12-02 LAB — CBC WITH DIFFERENTIAL/PLATELET
Abs Immature Granulocytes: 0.03 K/uL (ref 0.00–0.07)
Basophils Absolute: 0.1 K/uL (ref 0.0–0.1)
Basophils Relative: 1 %
Eosinophils Absolute: 0.1 K/uL (ref 0.0–0.5)
Eosinophils Relative: 1 %
HCT: 33.3 % — ABNORMAL LOW (ref 36.0–46.0)
Hemoglobin: 10.8 g/dL — ABNORMAL LOW (ref 12.0–15.0)
Immature Granulocytes: 0 %
Lymphocytes Relative: 10 %
Lymphs Abs: 0.8 K/uL (ref 0.7–4.0)
MCH: 31.8 pg (ref 26.0–34.0)
MCHC: 32.4 g/dL (ref 30.0–36.0)
MCV: 97.9 fL (ref 80.0–100.0)
Monocytes Absolute: 0.6 K/uL (ref 0.1–1.0)
Monocytes Relative: 8 %
Neutro Abs: 6.4 K/uL (ref 1.7–7.7)
Neutrophils Relative %: 80 %
Platelets: 176 K/uL (ref 150–400)
RBC: 3.4 MIL/uL — ABNORMAL LOW (ref 3.87–5.11)
RDW: 13.7 % (ref 11.5–15.5)
WBC: 8 K/uL (ref 4.0–10.5)
nRBC: 0 % (ref 0.0–0.2)

## 2023-12-02 LAB — TSH: TSH: 5.023 u[IU]/mL — ABNORMAL HIGH (ref 0.350–4.500)

## 2023-12-02 LAB — MAGNESIUM: Magnesium: 2.1 mg/dL (ref 1.7–2.4)

## 2023-12-02 MED ORDER — SODIUM CHLORIDE 0.9 % IV BOLUS
500.0000 mL | Freq: Once | INTRAVENOUS | Status: AC
  Administered 2023-12-02: 500 mL via INTRAVENOUS

## 2023-12-02 MED ORDER — SALINE SPRAY 0.65 % NA SOLN: 1.0000 | Freq: Three times a day (TID) | NASAL | Status: DC | PRN

## 2023-12-02 MED ORDER — AMIODARONE HCL 200 MG PO TABS
200.0000 mg | ORAL_TABLET | Freq: Every day | ORAL | Status: DC
  Administered 2023-12-04: 200 mg via ORAL
  Filled 2023-12-02 (×2): qty 1

## 2023-12-02 MED ORDER — POLYETHYLENE GLYCOL 3350 17 G PO PACK
17.0000 g | PACK | Freq: Every day | ORAL | Status: DC | PRN
Start: 2023-12-02 — End: 2023-12-04

## 2023-12-02 MED ORDER — ORAL CARE MOUTH RINSE: 15.0000 mL | OROMUCOSAL | Status: DC | PRN

## 2023-12-02 MED ORDER — LETROZOLE 2.5 MG PO TABS
2.5000 mg | ORAL_TABLET | Freq: Every day | ORAL | Status: DC
  Administered 2023-12-04: 2.5 mg via ORAL
  Filled 2023-12-02 (×2): qty 1

## 2023-12-02 MED ORDER — DOCUSATE SODIUM 100 MG PO CAPS
100.0000 mg | ORAL_CAPSULE | Freq: Two times a day (BID) | ORAL | Status: DC | PRN
Start: 2023-12-02 — End: 2023-12-04

## 2023-12-02 NOTE — ED Provider Notes (Signed)
 [place] EMERGENCY DEPARTMENT AT [facility] Provider Note   CSN: [id number] Arrival date & time: 12/02/23  1639     Patient presents with: Near Syncope   Tara Hardin is a 83 y.o. female.   HPI 83 year old female presents with syncope.  History is from patient.  She was on the phone in her house after she had been walking around her room.  She was talking to her daughter.  She was finding out that her daughter was not coming over today.  Patient quickly felt faint and then passed out briefly.  She states the feeling faint prior to the passing out was very brief.  She denies any chest pain before or after.  No headache.  No focal weakness.  States she passed out about a month ago and has been due to set up a heart monitor but has not yet had it started.  She had a TAVR about 2 months ago. No recent change in her medications.  Prior to Admission medications   Medication Sig Start Date End Date Taking? Authorizing Provider  amiodarone  (PACERONE ) 200 MG tablet Take 1 tablet (200 mg total) by mouth daily. Patient taking differently: Take 200 mg by mouth in the morning. 11/02/23  Yes Verlin Lonni BIRCH, MD  apixaban  (ELIQUIS ) 2.5 MG TABS tablet Take 2.5 mg by mouth 2 (two) times daily.   Yes [provider]  bismuth  subsalicylate (PEPTO BISMOL) 262 MG/15ML suspension Take 30 mLs by mouth every 6 (six) hours as needed for indigestion or diarrhea or loose stools.   Yes [provider]  CALCIUM PO Take 1 tablet by mouth daily.   Yes [provider]  furosemide  (LASIX ) 20 MG tablet Take 20 mg by mouth in the morning.   Yes [provider]  letrozole  (FEMARA ) 2.5 MG tablet Take 1 tablet (2.5 mg total) by mouth daily. Patient taking differently: Take 2.5 mg by mouth in the morning. 11/01/23  Yes Gudena, Vinay, MD  Multiple Vitamin (MULTIVITAMIN PO) Take 1 tablet by mouth in the morning.   Yes [provider]  Omega-3 Fatty Acids (FISH OIL  PO) Take 1 tablet by mouth every other day. Alternate between Fish Oil and Red Yeast Rice Tablets   Yes [provider]  Red Yeast Rice Extract (RED YEAST RICE PO) Take 1 tablet by mouth every other day. Alternate between Red Yeast Rice Tablets and Fish Oil.   Yes [provider]  sodium chloride  (OCEAN) 0.65 % nasal spray Place 1 spray into the nose every 8 (eight) hours as needed (Dry nasal passages).   Yes [provider]  spironolactone  (ALDACTONE ) 25 MG tablet Take 12.5 mg by mouth in the morning.   Yes [provider]    Allergies: Patient has no known allergies.    Review of Systems  Constitutional:  Negative for fever.  Respiratory:  Negative for shortness of breath.   Cardiovascular:  Negative for chest pain.  Neurological:  Positive for syncope and light-headedness. Negative for headaches.    Updated Vital Signs BP (!) 113/101   Pulse (!) 49   Temp 97.6 F (36.4 C) (Oral)   Resp 14   Ht 5' 4 (1.626 m)   Wt 47.6 kg   SpO2 100%   BMI 18.01 kg/m   Physical Exam Vitals and nursing note reviewed.  Constitutional:      General: She is not in acute distress.    Appearance: She is well-developed. She is not ill-appearing  or diaphoretic.  HENT:     Head: Normocephalic and atraumatic.  Cardiovascular:     Rate and Rhythm: Regular rhythm. Bradycardia present.     Pulses:          Radial pulses are 2+ on the right side.     Heart sounds: Normal heart sounds.  Pulmonary:     Effort: Pulmonary effort is normal.     Breath sounds: Normal breath sounds.  Abdominal:     General: There is no distension.  Skin:    General: Skin is warm and dry.  Neurological:     Mental Status: She is alert.     (all labs ordered are listed, but only abnormal results are displayed) Labs Reviewed  COMPREHENSIVE METABOLIC PANEL WITH GFR - Abnormal; Notable for the following components:      Result Value   Glucose, Bld 108 (*)    BUN 26 (*)     Creatinine, Ser 1.59 (*)    Calcium 8.5 (*)    Albumin 3.4 (*)    GFR, Estimated 32 (*)    All other components within normal limits  CBC WITH DIFFERENTIAL/PLATELET - Abnormal; Notable for the following components:   RBC 3.40 (*)    Hemoglobin 10.8 (*)    HCT 33.3 (*)    All other components within normal limits  MAGNESIUM   TSH    EKG: EKG Interpretation Date/Time:  Sunday December 02 2023 16:53:30 EDT Ventricular Rate:  37 PR Interval:  433 QRS Duration:  169 QT Interval:  610 QTC Calculation: 479 R Axis:   268  Text Interpretation: Sinus bradycardia with mobitz II block Prolonged PR interval Left bundle branch block Confirmed by Freddi Hamilton [phone]) on 12/02/2023 5:00:53 PM  Radiology: DG Chest Portable 1 View Result Date: 12/02/2023 CLINICAL DATA:  Syncopal episode. EXAM: PORTABLE CHEST 1 VIEW COMPARISON:  09/07/2023 and older studies. FINDINGS: Stable enlargement of cardiac silhouette. TAVR performed since the previous chest radiograph. No mediastinal or hilar masses. Clear lungs.  No pleural effusion or pneumothorax. Skeletal structures are grossly intact. IMPRESSION: 1. No acute cardiopulmonary disease. Electronically Signed   By: Alm Parkins M.D.   On: 12/02/2023 18:03     Procedures   Medications Ordered in the ED  sodium chloride  0.9 % bolus 500 mL (500 mLs Intravenous New Bag/Given 12/02/23 1820)                                    Medical Decision Making Amount and/or Complexity of Data Reviewed External Data Reviewed: notes. Labs: ordered.    Details: Mild AKI Radiology: ordered and independent interpretation performed.    Details: No CHF ECG/medicine tests: ordered and independent interpretation performed.    Details: Mobitz 2 AV block  Risk Decision regarding hospitalization.   Patient has intermittently had bradycardia and what appears to be a Mobitz 2 AV block on the monitor.  This was also captured on the ECG.  No recurrent syncope or hypotension.   I do think that her syncope was related to heart block/arrhythmia.  Discussed with Dr. Charlott of cardiology, who will come admit.     Final diagnoses:  Syncope, cardiogenic    ED Discharge Orders     None          Freddi Hamilton, MD 12/02/23 [phone]

## 2023-12-02 NOTE — ED Triage Notes (Signed)
 PT BIB [facility] from  home. PT had been outside walking, came back in and set on the edge of her bed while talking to her daughter.PT became lightheaded and had a syncopal episode.PT contributes it to a panic attack, she had a episode June 10th. PT is supposed to be on a heart monitor but hasn't set it up. A+OX 4. No cp/sob/ loc and did not hit her head.

## 2023-12-02 NOTE — ED Notes (Signed)
 Called and placed PT on monitor with CCMD.

## 2023-12-02 NOTE — H&P (Addendum)
 Cardiology Admission History and Physical   Patient ID: Tara Hardin MRN: [id number]; DOB: [birth year]   Admission date: 12/02/2023  PCP:  Claudene Pellet, MD   [facility] Providers Cardiologist:  Lonni Cash, MD  Electrophysiologist:  OLE ONEIDA HOLTS, MD  Structural Heart:  Lonni Cash, MD     Chief Complaint:  Fainting  Patient Profile: Tara Hardin is a 83 y.o. 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   History of Present Illness: Ms. Dungee reports at around 2 pm she walked into her house from being outside working in her garden.  She sat down and while talking to her daughter on the phone, she suddenly noticed the television became blurry and she soon awakened lying backwards in her bed.  She feels that she was out and unconscious for a few seconds.  After waking up, she reports just feeling funny but denied dizziness or lightheadedness.  Her daughter who was talking to her on the phone convinced her to call 911.  Patient was brought to our hospital by emergency medical services.  Of note, patient reports a similar episode in June of this year.  Symptoms occurred while sitting in her bed for which soon noticed waking up and being unconscious.  She decided not to pursue medical attention at the time.    At this time, patient denies any ongoing symptoms such as dizziness or lightheadedness.  She denies chest pain, shortness of breath, shortness of breath with lying  flat, waking up due to shortness of breath or leg swelling.    In the emergency department, ECG revealed likely 2nd degree high-grade AV block with 2:1 block (concerning for Mobitz II likely) with a heart rate ranging from 37 bpm to 74 bpm.  Patient was hemodynamically stable.     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 Susy and Dr. Lucas.   Severe aortic stenosis    Past Surgical History:  Procedure Laterality Date   BREAST LUMPECTOMY WITH RADIOACTIVE SEED LOCALIZATION Left 08/11/2021   Procedure: LEFT BREAST LUMPECTOMY WITH RADIOACTIVE SEED LOCALIZATION X2;  Surgeon: Curvin Deward MOULD, MD;  Location: [facility];  Service: General;  Laterality: Left;   CARDIOVERSION N/A 05/15/2023   Procedure: CARDIOVERSION;  Surgeon: Rolan Ezra RAMAN, MD;  Location: [facility] INVASIVE CV LAB;  Service: Cardiovascular;  Laterality: N/A;   INTRAOPERATIVE TRANSTHORACIC ECHOCARDIOGRAM N/A 09/11/2023   Procedure: ECHOCARDIOGRAM, TRANSTHORACIC;  Surgeon: Cash Lonni BIRCH, MD;  Location: [facility] INVASIVE CV LAB;  Service: Cardiovascular;  Laterality: N/A;   RE-EXCISION OF BREAST CANCER,SUPERIOR MARGINS Left 10/07/2021   Procedure: RE-EXCISION OF LEFT BREAST INFERIOR MARGIN;  Surgeon: Curvin Deward III,  MD;  Location: [facility];  Service: General;  Laterality: Left;   RIGHT HEART CATH AND CORONARY ANGIOGRAPHY N/A 05/14/2023   Procedure: RIGHT HEART CATH AND CORONARY ANGIOGRAPHY;  Surgeon: Rolan Ezra RAMAN, MD;  Location: [facility] INVASIVE CV LAB;  Service: Cardiovascular;  Laterality: N/A;    TRANSESOPHAGEAL ECHOCARDIOGRAM (CATH LAB) N/A 05/15/2023   Procedure: TRANSESOPHAGEAL ECHOCARDIOGRAM;  Surgeon: Rolan Ezra RAMAN, MD;  Location: [facility] INVASIVE CV LAB;  Service: Cardiovascular;  Laterality: N/A;     Medications Prior to Admission: Prior to Admission medications   Medication Sig Start Date End Date Taking? Authorizing Provider  amiodarone  (PACERONE ) 200 MG tablet Take 1 tablet (200 mg total) by mouth daily. 11/02/23   Verlin Lonni BIRCH, MD  amoxicillin  (AMOXIL ) 500 MG tablet Take 4 tablets (2,000 mg total) by mouth as directed. Take 1 hour prior to dental work including cleanings to protect heart valve from becoming infected. 09/13/23   Sebastian Lamarr SAUNDERS, PA-C  apixaban  (ELIQUIS ) 2.5 MG TABS tablet Take 1 tablet by mouth twice daily 11/09/23   Verlin Lonni BIRCH, MD  bismuth  subsalicylate (PEPTO BISMOL) 262 MG/15ML suspension Take 30 mLs by mouth every 8 (eight) hours as needed for indigestion or diarrhea or loose stools.    [provider]  furosemide  (LASIX ) 20 MG tablet Take 2 tablets (40 mg total) by mouth daily. Patient taking differently: Take 20 mg by mouth daily. 06/20/23   Glena Harlene HERO, FNP  letrozole  (FEMARA ) 2.5 MG tablet Take 1 tablet (2.5 mg total) by mouth daily. 11/01/23   Gudena, Vinay, MD  Red Yeast Rice Extract 600 MG CAPS Take by mouth. 09/19/23   [provider]  sodium chloride  (OCEAN) 0.65 % nasal spray Place 1 spray into the nose every 8 (eight) hours as needed (Dry nasal passages).    [provider]  spironolactone  (ALDACTONE ) 25 MG tablet Take 0.5 tablets (12.5 mg total) by mouth daily. 06/20/23 09/30/24  Glena Harlene HERO, FNP     Allergies:   No Known Allergies  Social History:   Social History   Socioeconomic History   Marital status: Married    Spouse name: Not on file   Number of children: 3   Years of education: Not on file   Highest education level: Not on file  Occupational History   Occupation: Retired  Information systems manager  Tobacco Use   Smoking status: Never   Smokeless tobacco: Never   Tobacco comments:    Never smoked 06/20/23  Vaping Use   Vaping status: Never Used  Substance and Sexual Activity   Alcohol use: Not Currently   Drug use: Never   Sexual activity: Not Currently    Birth control/protection: Post-menopausal  Other Topics Concern   Not on file  Social History Narrative   Not on file   Social Drivers of Health   Financial Resource Strain: Not on file  Food Insecurity: Patient Declined (09/12/2023)   Hunger Vital Sign    Worried About Running Out of Food in the Last Year: Patient declined    Ran Out of Food in the Last Year: Patient declined  Transportation Needs: Patient Declined (09/12/2023)   PRAPARE - Administrator, Civil Service (Medical): Patient declined    Lack of Transportation (Non-Medical): Patient declined  Physical Activity: Not on file  Stress: Not on file  Social Connections: Patient Declined (09/12/2023)   Social Connection and Isolation Panel    Frequency of Communication with Friends and Family: Patient  declined    Frequency of Social Gatherings with Friends and Family: Patient declined    Attends Religious Services: Patient declined    Active Member of Clubs or Organizations: Patient declined    Attends Banker Meetings: Patient declined    Marital Status: Patient declined  Intimate Partner Violence: Patient Declined (09/12/2023)   Humiliation, Afraid, Rape, and Kick questionnaire    Fear of Current or Ex-Partner: Patient declined    Emotionally Abused: Patient declined    Physically Abused: Patient declined    Sexually Abused: Patient declined     Family History:   The patient's family history includes Atrial fibrillation in her brother; Breast cancer in her cousin and paternal aunt; Heart attack in her brother and sister; Hypertension in her brother and sister; Irregular heart beat in her brother; Lung cancer in her  mother.    ROS:  Please see the history of present illness.  All other ROS reviewed and negative.     Physical Exam/Data: Vitals:   12/02/23 1702 12/02/23 1730 12/02/23 1733 12/02/23 1734  BP: 117/72 (!) 134/46    Pulse: 74 65    Resp: 14 17 14 18   Temp: 97.6 F (36.4 C)     TempSrc: Oral     SpO2: 100% 100% 100% 96%  Weight:      Height:       No intake or output data in the 24 hours ending 12/02/23 1822    12/02/2023    4:50 PM 10/18/2023    2:15 PM 10/01/2023    2:55 PM  Last 3 Weights  Weight (lbs) 104 lb 15 oz 105 lb 107 lb 3.2 oz  Weight (kg) 47.6 kg 47.628 kg 48.626 kg     Body mass index is 18.01 kg/m.  General:  Well nourished, well developed, in no acute distress HEENT: normal Neck: no JVD Vascular: No carotid bruits; Distal pulses 2+ bilaterally   Cardiac:  normal S1, S2; RRR; 2/6 crescendo murmur heard best RUSB Lungs:  clear to auscultation bilaterally, no wheezing, rhonchi or rales  Abd: soft, nontender, no hepatomegaly  Ext: no edema Musculoskeletal:  No deformities, BUE and BLE strength normal and equal Skin: warm and dry  Neuro:  CNs 2-12 intact, no focal abnormalities noted Psych:  Normal affect   EKG:  The ECG that was done 12/02/23 (16:51 hours) was personally reviewed and demonstrates 2nd degree 2:1 AV block with left bundle branch block.  Repeat ECG done at 16:53 demonstrated 2:1 AV block with left bundle branch block with prolonging of PR interval with heart rate at 37 bpm.    Relevant CV Studies: # Echocardiogram 11/27/23: IMPRESSIONS   1. Left ventricular ejection fraction, by estimation, is 25 to 30%. Left  ventricular ejection fraction by 3D volume is 30 %. The left ventricle has  severely decreased function. The left ventricle has no regional wall  motion abnormalities. Left  ventricular diastolic parameters are consistent with Grade II diastolic  dysfunction (pseudonormalization). The average left ventricular global  longitudinal strain is  -11.9 %. The global longitudinal strain is  abnormal.   2. Right ventricular systolic function is normal. The right ventricular  size is normal.   3. Left atrial size was severely dilated.   4. The mitral valve is normal in structure. Mild mitral valve  regurgitation. No evidence of mitral stenosis.   5. The aortic valve has been repaired/replaced. Aortic valve  regurgitation is trivial. No aortic stenosis is present. There is a  23 mm  Sapien prosthetic (TAVR) valve present in the aortic position. Echo  findings are consistent with normal structure and  function of the aortic valve prosthesis. Aortic valve mean gradient  measures 8.8 mmHg.   6. The inferior vena cava is normal in size with greater than 50%  respiratory variability, suggesting right atrial pressure of 3 mmHg.   Laboratory Data: High Sensitivity Troponin:  No results for input(s): TROPONINIHS in the last 720 hours.    Chemistry Recent Labs  Lab 12/02/23 1705  NA 138  K 4.2  CL 106  CO2 23  GLUCOSE 108*  BUN 26*  CREATININE 1.59*  CALCIUM 8.5*  MG 2.1  GFRNONAA 32*  ANIONGAP 9    Recent Labs  Lab 12/02/23 1705  PROT 6.6  ALBUMIN 3.4*  AST 35  ALT 21  ALKPHOS 74  BILITOT 0.7   Lipids No results for input(s): CHOL, TRIG, HDL, LABVLDL, LDLCALC, CHOLHDL in the last 168 hours. Hematology Recent Labs  Lab 12/02/23 1705  WBC 8.0  RBC 3.40*  HGB 10.8*  HCT 33.3*  MCV 97.9  MCH 31.8  MCHC 32.4  RDW 13.7  PLT 176   Thyroid No results for input(s): TSH, FREET4 in the last 168 hours. BNPNo results for input(s): BNP, PROBNP in the last 168 hours.  DDimer No results for input(s): DDIMER in the last 168 hours.  Radiology/Studies:  DG Chest Portable 1 View Result Date: 12/02/2023 CLINICAL DATA:  Syncopal episode. EXAM: PORTABLE CHEST 1 VIEW COMPARISON:  09/07/2023 and older studies. FINDINGS: Stable enlargement of cardiac silhouette. TAVR performed since the previous chest  radiograph. No mediastinal or hilar masses. Clear lungs.  No pleural effusion or pneumothorax. Skeletal structures are grossly intact. IMPRESSION: 1. No acute cardiopulmonary disease. Electronically Signed   By: Alm Parkins M.D.   On: 12/02/2023 18:03     Assessment and Plan: Syncope: Patient with two episodes of syncope with noted 2:1 high-grade AV block on ECG.  Patient is eligible for CRT-D but refused prior but willing to consider given current situation. Bradyarrhythmia likely multifactorial (recent transcatheter aortic valve replacement, age-related conduction changes, and possible amiodarone ). She is hemodynamically stable but telemetry demonstrates heart rates drops to 37 bpm periodically. --Admit to the CICU for closer monitoring.   --Hold apixaban  given pending procedure.   --Check TSH levels. --Place transcutaneous pacemaker pads for back up support if needed. --Discuss with electrophysiology in the morning CRT-D placement.    Acute on chronic kidney disease:  Known stage IIIa chronic kidney disease now with acute worsening of creatinine.  Likely secondary to hypovolemia and acute pre-renal azotemia.   --Give gentle IV fluid resuscitation. --Start diet. --Repeat BMP later this evening.   --Reassess need to restart furosemide  after BMP.    Aortic valve replacement in situ: Patient with severe aortic valve stenosis now status post 23 mm Edwards Sapien 3 Ultra transcatheter aortic valve placement.  She is without symptoms of chest pain or shortness of breath.  Of note, now with high-grade AV block with symptomatic 2:1 AV block, possibly secondary to aortic valve replacement.  Currently taking apixaban  2.5 mg twice daily only for valve thrombosis prophylaxis. --Hold apixaban  as above. --May need to consider bridge with heparin  if procedure does not occur tomorrow.    Non-ischemic cardiomyopathy: Patient with known non-ischemic cardiomypathy with noted heart failure with reduced  ejection fraction (last known left ventricular EF reported at 25%-30%).  Patient turned down CRT-D prior.  Guideline directed medical therapies apparently  limited due to hypotension and bradycardia.  Currently taking furosemide  daily for volume control. --Offer CRT-D as above. --Further guideline directed medical therapy management outpatient.    Paroxysmal atrial fibrillation: Status post DC cardioversion in 04/2023 with sustained rhythm control.  Placed on amiodarone  prior.  Now with high-grade AV block which amiodarone  is likely part of a multi-factorial etiology.  CHA2DS2 VASc score of 4, making patient moderate risk for atrial fibrillation related thrombo-embolism.  Taking low-dose apixaban  prior due to age and frailty.   --Continue amiodarone  and apixaban  for now.    Risk Assessment/Risk Scores:      New [place]  Heart Association (NYHA) Functional Class NYHA Class I  CHA2DS2-VASc Score = 4   This indicates a 4.8% annual risk of stroke. The patient's score is based upon: CHF History: 1 HTN History: 0 Diabetes History: 0 Stroke History: 0 Vascular Disease History: 0 Age Score: 2 Gender Score: 1    Code Status: Full Code  Severity of Illness: The appropriate patient status for this patient is INPATIENT. Inpatient status is judged to be reasonable and necessary in order to provide the required intensity of service to ensure the patient's safety. The patient's presenting symptoms, physical exam findings, and initial radiographic and laboratory data in the context of their chronic comorbidities is felt to place them at high risk for further clinical deterioration. Furthermore, it is not anticipated that the patient will be medically stable for discharge from the hospital within 2 midnights of admission.   * I certify that at the point of admission it is my clinical judgment that the patient will require inpatient hospital care spanning beyond 2 midnights from the point of admission due  to high intensity of service, high risk for further deterioration and high frequency of surveillance required.*  For questions or updates, please contact [facility] Please consult [email or web address] for contact info under     Signed, Lue JONETTA Sauers, MD  12/02/2023 6:22 PM

## 2023-12-03 ENCOUNTER — Inpatient Hospital Stay (HOSPITAL_COMMUNITY)

## 2023-12-03 ENCOUNTER — Other Ambulatory Visit: Payer: Self-pay

## 2023-12-03 ENCOUNTER — Encounter (HOSPITAL_COMMUNITY)
Admission: EM | Disposition: A | Discharge status: Home / Self Care | Payer: Self-pay | Source: Home / Self Care | Attending: Internal Medicine

## 2023-12-03 DIAGNOSIS — I441 Atrioventricular block, second degree: Principal | ICD-10-CM

## 2023-12-03 DIAGNOSIS — R001 Bradycardia, unspecified: Secondary | ICD-10-CM

## 2023-12-03 DIAGNOSIS — I495 Sick sinus syndrome: Secondary | ICD-10-CM

## 2023-12-03 LAB — CBC
HCT: 29.9 % — ABNORMAL LOW (ref 36.0–46.0)
Hemoglobin: 10 g/dL — ABNORMAL LOW (ref 12.0–15.0)
MCH: 32.4 pg (ref 26.0–34.0)
MCHC: 33.4 g/dL (ref 30.0–36.0)
MCV: 96.8 fL (ref 80.0–100.0)
Platelets: 162 K/uL (ref 150–400)
RBC: 3.09 MIL/uL — ABNORMAL LOW (ref 3.87–5.11)
RDW: 13.9 % (ref 11.5–15.5)
WBC: 8.4 K/uL (ref 4.0–10.5)
nRBC: 0 % (ref 0.0–0.2)

## 2023-12-03 LAB — BASIC METABOLIC PANEL WITH GFR
Anion gap: 8 (ref 5–15)
BUN: 27 mg/dL — ABNORMAL HIGH (ref 8–23)
CO2: 23 mmol/L (ref 22–32)
Calcium: 8.4 mg/dL — ABNORMAL LOW (ref 8.9–10.3)
Chloride: 111 mmol/L (ref 98–111)
Creatinine, Ser: 1.71 mg/dL — ABNORMAL HIGH (ref 0.44–1.00)
GFR, Estimated: 30 mL/min — ABNORMAL LOW (ref >60)
Glucose, Bld: 116 mg/dL — ABNORMAL HIGH (ref 70–99)
Potassium: 3.8 mmol/L (ref 3.5–5.1)
Sodium: 142 mmol/L (ref 135–145)

## 2023-12-03 LAB — SURGICAL PCR SCREEN
MRSA, PCR: NEGATIVE
Staphylococcus aureus: NEGATIVE

## 2023-12-03 SURGERY — BIV PACEMAKER INSERTION CRT-P

## 2023-12-03 MED ORDER — SODIUM CHLORIDE 0.9% FLUSH: 3.0000 mL | INTRAVENOUS | Status: DC | PRN

## 2023-12-03 MED ORDER — MIDAZOLAM HCL 2 MG/2ML IJ SOLN
INTRAMUSCULAR | Status: AC
  Filled 2023-12-03: qty 2

## 2023-12-03 MED ORDER — ACETAMINOPHEN 325 MG PO TABS: 325.0000 mg | ORAL_TABLET | ORAL | Status: DC | PRN

## 2023-12-03 MED ORDER — SODIUM CHLORIDE 0.9 % IV SOLN: INTRAVENOUS | Status: DC

## 2023-12-03 MED ORDER — LIDOCAINE HCL (PF) 1 % IJ SOLN
INTRAMUSCULAR | Status: AC
  Filled 2023-12-03: qty 60

## 2023-12-03 MED ORDER — CHLORHEXIDINE GLUCONATE 4 % EX SOLN
60.0000 mL | Freq: Once | CUTANEOUS | Status: AC
  Filled 2023-12-03: qty 60

## 2023-12-03 MED ORDER — ONDANSETRON HCL 4 MG/2ML IJ SOLN
4.0000 mg | Freq: Four times a day (QID) | INTRAMUSCULAR | Status: DC | PRN
  Administered 2023-12-03: 4 mg via INTRAVENOUS
  Filled 2023-12-03: qty 2

## 2023-12-03 MED ORDER — IODIXANOL 320 MG/ML IV SOLN
INTRAVENOUS | Status: DC | PRN
  Administered 2023-12-03: 11 mL
  Administered 2023-12-03: 15 mL

## 2023-12-03 MED ORDER — SODIUM CHLORIDE 0.9 % IV SOLN
INTRAVENOUS | Status: AC | PRN
  Administered 2023-12-03: 250 mL via INTRAVENOUS

## 2023-12-03 MED ORDER — SODIUM CHLORIDE 0.9 % IV SOLN
80.0000 mg | INTRAVENOUS | Status: AC
  Administered 2023-12-03: 80 mg
  Filled 2023-12-03: qty 2

## 2023-12-03 MED ORDER — FENTANYL CITRATE (PF) 100 MCG/2ML IJ SOLN
INTRAMUSCULAR | Status: AC
Start: 2023-12-03 — End: 2023-12-03
  Filled 2023-12-03: qty 2

## 2023-12-03 MED ORDER — SODIUM CHLORIDE 0.9% FLUSH
3.0000 mL | Freq: Two times a day (BID) | INTRAVENOUS | Status: DC
  Administered 2023-12-03 – 2023-12-04 (×3): 3 mL via INTRAVENOUS

## 2023-12-03 MED ORDER — SODIUM CHLORIDE 0.9 % IV SOLN: 250.0000 mL | INTRAVENOUS | Status: DC

## 2023-12-03 MED ORDER — HEPARIN (PORCINE) IN NACL 1000-0.9 UT/500ML-% IV SOLN
INTRAVENOUS | Status: DC | PRN
  Administered 2023-12-03: 500 mL

## 2023-12-03 MED ORDER — CEFAZOLIN SODIUM-DEXTROSE 2-4 GM/100ML-% IV SOLN
INTRAVENOUS | Status: AC
  Filled 2023-12-03: qty 100

## 2023-12-03 MED ORDER — GENTAMICIN SULFATE 40 MG/ML IJ SOLN
INTRAMUSCULAR | Status: AC
  Filled 2023-12-03: qty 2

## 2023-12-03 MED ORDER — CHLORHEXIDINE GLUCONATE CLOTH 2 % EX PADS
6.0000 | MEDICATED_PAD | Freq: Every day | CUTANEOUS | Status: DC
Start: 2023-12-03 — End: 2023-12-04
  Administered 2023-12-03 – 2023-12-04 (×2): 6 via TOPICAL

## 2023-12-03 MED ORDER — CEFAZOLIN SODIUM-DEXTROSE 2-4 GM/100ML-% IV SOLN
2.0000 g | INTRAVENOUS | Status: AC
  Administered 2023-12-03: 2 g via INTRAVENOUS

## 2023-12-03 MED ORDER — LIDOCAINE HCL (PF) 1 % IJ SOLN
INTRAMUSCULAR | Status: DC | PRN
  Administered 2023-12-03: 60 mL

## 2023-12-03 MED ORDER — CHLORHEXIDINE GLUCONATE 4 % EX SOLN
60.0000 mL | Freq: Once | CUTANEOUS | Status: AC
  Administered 2023-12-03: 4 via TOPICAL

## 2023-12-03 MED ORDER — FENTANYL CITRATE (PF) 100 MCG/2ML IJ SOLN
INTRAMUSCULAR | Status: AC
  Filled 2023-12-03: qty 2

## 2023-12-03 MED ORDER — FENTANYL CITRATE (PF) 100 MCG/2ML IJ SOLN
INTRAMUSCULAR | Status: DC | PRN
  Administered 2023-12-03: 50 ug via INTRAVENOUS
  Administered 2023-12-03: 12.5 ug via INTRAVENOUS
  Administered 2023-12-03 (×3): 25 ug via INTRAVENOUS
  Administered 2023-12-03: 12.5 ug via INTRAVENOUS
  Administered 2023-12-03: 25 ug via INTRAVENOUS

## 2023-12-03 MED ORDER — MIDAZOLAM HCL 5 MG/5ML IJ SOLN
INTRAMUSCULAR | Status: DC | PRN
Start: 2023-12-03 — End: 2023-12-03
  Administered 2023-12-03 (×4): .5 mg via INTRAVENOUS
  Administered 2023-12-03: 1 mg via INTRAVENOUS

## 2023-12-03 MED ORDER — CEFAZOLIN SODIUM-DEXTROSE 1-4 GM/50ML-% IV SOLN
1.0000 g | Freq: Two times a day (BID) | INTRAVENOUS | Status: AC
  Administered 2023-12-03 – 2023-12-04 (×2): 1 g via INTRAVENOUS
  Filled 2023-12-03 (×3): qty 50

## 2023-12-03 SURGICAL SUPPLY — 24 items
BALLOON COR SINUS VENO 6FR 80 (BALLOONS) IMPLANT
CABLE SURGICAL S-101-97-12 (CABLE) ×1 IMPLANT
CATH ATTAIN SEL SURV 6248V-130 (CATHETERS) IMPLANT
CATH CPS AIM UNI II 5.8FR (MISCELLANEOUS) IMPLANT
CATH CPS DIRECT 115 DS2C019 (CATHETERS) IMPLANT
CATH CPS DIRECT 135 DS2C020 (CATHETERS) IMPLANT
CATH CPS LOCATOR 3D MED (CATHETERS) IMPLANT
KIT ESSENTIALS PG (KITS) IMPLANT
LEAD QUARTET 1458Q-86CM (Lead) IMPLANT
LEAD ULTIPACE 52 LPA1231/52 (Lead) IMPLANT
LEAD ULTIPACE 65 LPA1231/65 (Lead) IMPLANT
PACEMAKER ASSURITY DR-RF (Pacemaker) IMPLANT
PAD DEFIB RADIO PHYSIO CONN (PAD) ×1 IMPLANT
SHEATH 7FR PRELUDE SNAP 13 (SHEATH) IMPLANT
SHEATH 9.5FR PRELUDE SNAP 13 (SHEATH) IMPLANT
SHEATH 9FR PRELUDE SNAP 13 (SHEATH) IMPLANT
SHEATH PROBE COVER 6X72 (BAG) IMPLANT
SLITTER AGILIS HISPRO (INSTRUMENTS) IMPLANT
TOOL HELIX LOCKING (MISCELLANEOUS) IMPLANT
TRAY PACEMAKER INSERTION (PACKS) ×1 IMPLANT
WIRE ACUITY WHISPER EDS 4648 (WIRE) IMPLANT
WIRE HI TORQ VERSACORE-J 145CM (WIRE) IMPLANT
WIRE MICRO SET SILHO 5FR 7 (SHEATH) IMPLANT
WIRE RUNTHROUGH .014X180CM (WIRE) IMPLANT

## 2023-12-03 NOTE — Consult Note (Addendum)
 ELECTROPHYSIOLOGY CONSULT NOTE    Patient ID: Tara Hardin MRN: [id number], DOB/AGE: 82/11/1940 81 y.o.  Admit date: 12/02/2023 Date of Consult: 12/03/2023  Primary Physician: Claudene Pellet, MD Primary Cardiologist: Lonni Cash, MD  Electrophysiologist: Previously consulted by Dr. Cindie, but no follow up   Referring Provider: Dr. Charlott  Patient Profile: Tara Hardin is a 83 y.o. 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 Charlott.  HPI:  [name] is a 83 y.o. female with history as above.   Seen by Dr. Cindie 05/2023 with bradycardia s/p [facility] and offered CRT-D but pt deferred.   Seen by AF clinic 06/20/2023 and 09/17/2023 for amiodarone  maintenance.   Presented 7/20 for syncope and was found to have 2:1 HB in the ED with HRs as low as 37 bpm. EP asked to see for PPM consideration.   Feeling OK today. Willing to proceed with pacemaker. Had syncope in June as well after working out in the garden. Attributed it to heat, but Zio order. No results available.   Labs Potassium3.8 (07/21 9486) Magnesium   2.1 (07/20 1705) Creatinine, ser  1.71* (07/21 0513) PLT  162 (07/21 0513) HGB  10.0* (07/21 0513) WBC 8.4 (07/21 0513)  .    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 Susy and Dr. Lucas.   Severe aortic stenosis      Surgical History:  Past Surgical History:  Procedure Laterality Date   BREAST LUMPECTOMY WITH RADIOACTIVE SEED LOCALIZATION Left 08/11/2021   Procedure: LEFT BREAST LUMPECTOMY WITH RADIOACTIVE SEED LOCALIZATION X2;  Surgeon: Curvin Deward MOULD, MD;  Location: [facility];  Service: General;  Laterality: Left;   CARDIOVERSION N/A 05/15/2023   Procedure: CARDIOVERSION;  Surgeon: Rolan Ezra RAMAN, MD;  Location: [facility] INVASIVE CV LAB;  Service: Cardiovascular;  Laterality: N/A;   INTRAOPERATIVE TRANSTHORACIC ECHOCARDIOGRAM N/A 09/11/2023   Procedure: ECHOCARDIOGRAM, TRANSTHORACIC;  Surgeon: Cash Lonni BIRCH, MD;  Location: [facility] INVASIVE CV LAB;  Service: Cardiovascular;  Laterality: N/A;   RE-EXCISION OF BREAST CANCER,SUPERIOR MARGINS Left 10/07/2021   Procedure: RE-EXCISION OF LEFT BREAST INFERIOR MARGIN;  Surgeon: Curvin Deward MOULD, MD;  Location: [facility];  Service: General;  Laterality: Left;   RIGHT HEART CATH AND CORONARY ANGIOGRAPHY N/A 05/14/2023   Procedure: RIGHT HEART CATH AND CORONARY ANGIOGRAPHY;  Surgeon: Rolan Ezra RAMAN, MD;  Location: [facility] INVASIVE CV LAB;  Service: Cardiovascular;  Laterality: N/A;   TRANSESOPHAGEAL ECHOCARDIOGRAM (CATH LAB) N/A 05/15/2023   Procedure: TRANSESOPHAGEAL ECHOCARDIOGRAM;  Surgeon: Rolan Ezra RAMAN, MD;  Location: [facility] INVASIVE CV LAB;  Service: Cardiovascular;  Laterality: N/A;     Medications Prior  to Admission  Medication Sig Dispense Refill Last Dose/Taking   amiodarone  (PACERONE ) 200 MG tablet Take 1 tablet (200 mg total) by mouth daily. (Patient taking differently: Take 200 mg by mouth in the morning.) 90 tablet 3 12/02/2023   apixaban  (ELIQUIS ) 2.5 MG TABS  tablet Take 2.5 mg by mouth 2 (two) times daily.   12/02/2023 at  2:00 PM   bismuth  subsalicylate (PEPTO BISMOL) 262 MG/15ML suspension Take 30 mLs by mouth every 6 (six) hours as needed for indigestion or diarrhea or loose stools.   Unknown   CALCIUM PO Take 1 tablet by mouth daily.   12/01/2023   furosemide  (LASIX ) 20 MG tablet Take 20 mg by mouth in the morning.   12/02/2023   letrozole  (FEMARA ) 2.5 MG tablet Take 1 tablet (2.5 mg total) by mouth daily. (Patient taking differently: Take 2.5 mg by mouth in the morning.) 90 tablet 3 12/02/2023   Multiple Vitamin (MULTIVITAMIN PO) Take 1 tablet by mouth in the morning.   Past Week   Omega-3 Fatty Acids (FISH OIL PO) Take 1 tablet by mouth every other day. Alternate between Fish Oil and Red Yeast Rice Tablets   Past Week   Red Yeast Rice Extract (RED YEAST RICE PO) Take 1 tablet by mouth every other day. Alternate between Red Yeast Rice Tablets and Fish Oil.   Past Week   sodium chloride  (OCEAN) 0.65 % nasal spray Place 1 spray into the nose every 8 (eight) hours as needed (Dry nasal passages).   Unknown   spironolactone  (ALDACTONE ) 25 MG tablet Take 12.5 mg by mouth in the morning.   12/02/2023    Inpatient Medications:   amiodarone   200 mg Oral Daily   letrozole   2.5 mg Oral Daily    Allergies: No Known Allergies  Family History  Problem Relation Age of Onset   Lung cancer Mother        d. 49   Heart attack Sister    Hypertension Sister    Atrial fibrillation Brother    Hypertension Brother    Heart attack Brother    Irregular heart beat Brother    Breast cancer Paternal Aunt        dx after 76   Breast cancer Cousin        paternal female cousin     Physical Exam: Vitals:   12/03/23 0500 12/03/23 0600 12/03/23 0700 12/03/23 0812  BP: (!) 101/51 (!) 116/56 (!) 106/57   Pulse: (!) 47 (!) 56 (!) 56   Resp: 19 (!) 22 (!) 21   Temp:    97.8 F (36.6 C)  TempSrc:    Oral  SpO2: 95% 96% 95%   Weight: 46 kg     Height:         GEN- NAD, A&O x 3, normal affect HEENT: Normocephalic, atraumatic Lungs- CTAB, Normal effort.  Heart- Slow but regular rate and rhythm with occasional skipped beats, No M/G/R.  GI- Soft, NT, ND.  Extremities- No clubbing, cyanosis, or edema   Radiology/Studies: DG Chest Portable 1 View Result Date: 12/02/2023 CLINICAL DATA:  Syncopal episode. EXAM: PORTABLE CHEST 1 VIEW COMPARISON:  09/07/2023 and older studies. FINDINGS: Stable enlargement of cardiac silhouette. TAVR performed since the previous chest radiograph. No mediastinal or hilar masses. Clear lungs.  No pleural effusion or pneumothorax. Skeletal structures are grossly intact. IMPRESSION: 1. No acute cardiopulmonary disease. Electronically Signed   By: Alm Parkins M.D.   On: 12/02/2023 18:03   ECHOCARDIOGRAM COMPLETE  Result Date: 11/30/2023    ECHOCARDIOGRAM REPORT   Patient Name:   ERNESHA RAMONE Date of Exam: 11/27/2023 Medical Rec #:  [id number]      Height:       64.0 in Accession #:    [id number]     Weight:       105.0 lb Date of Birth:  [birth year]       BSA:          1.488 m Patient Age:    82 years       BP:           110/70 mmHg Patient Gender: F              HR:           64 bpm. Exam Location:  [place] Procedure: 2D Echo, 3D Echo, Cardiac Doppler, Color Doppler and Strain Analysis            (Both Spectral and Color Flow Doppler were utilized during            procedure). Indications:    Aortic valve disorder [I35.9 (ICD-10-CM)]  History:        Patient has prior history of Echocardiogram examinations, most                 recent 09/12/2023. CHF, Aortic Valve Disease, Arrythmias:Atrial                 Fibrillation; Risk Factors:Dyslipidemia and Non-Smoker.                 Aortic Valve: 23 mm Sapien prosthetic, stented (TAVR) valve is                 present in the aortic position.  Sonographer:    Alan Greenhouse RDMS, RVT, RDCS Referring Phys: [id number] LAMARR SAUNDERS THOMPSON IMPRESSIONS  1. Left ventricular ejection fraction, by  estimation, is 25 to 30%. Left ventricular ejection fraction by 3D volume is 30 %. The left ventricle has severely decreased function. The left ventricle has no regional wall motion abnormalities. Left ventricular diastolic parameters are consistent with Grade II diastolic dysfunction (pseudonormalization). The average left ventricular global longitudinal strain is -11.9 %. The global longitudinal strain is abnormal.  2. Right ventricular systolic function is normal. The right ventricular size is normal.  3. Left atrial size was severely dilated.  4. The mitral valve is normal in structure. Mild mitral valve regurgitation. No evidence of mitral stenosis.  5. The aortic valve has been repaired/replaced. Aortic valve regurgitation is trivial. No aortic stenosis is present. There is a 23 mm Sapien prosthetic (TAVR) valve present in the aortic position. Echo findings are consistent with normal structure and function of the aortic valve prosthesis. Aortic valve mean gradient measures 8.8 mmHg.  6. The inferior vena cava is normal in size with greater than 50% respiratory variability, suggesting right atrial pressure of 3 mmHg. Comparison(s): Echocardiogram done 09/12/23 showed an EF of 25-30% with an AV Mean Grad of 10 mmHg. FINDINGS  Left Ventricle: Left ventricular ejection fraction, by estimation, is 25 to 30%. Left ventricular ejection fraction by 3D volume is 30 %. The left ventricle has severely decreased function. The left ventricle has no regional wall motion abnormalities. The average left ventricular global longitudinal strain is -11.9 %. Strain was performed and the global longitudinal strain is abnormal. The left ventricular internal cavity size was normal in size. There is no left ventricular hypertrophy. Left ventricular diastolic parameters are  consistent with Grade II diastolic dysfunction (pseudonormalization). Right Ventricle: The right ventricular size is normal. No increase in right ventricular wall  thickness. Right ventricular systolic function is normal. Left Atrium: Left atrial size was severely dilated. Right Atrium: Right atrial size was normal in size. Pericardium: There is no evidence of pericardial effusion. Mitral Valve: The mitral valve is normal in structure. Mild mitral valve regurgitation. No evidence of mitral valve stenosis. Tricuspid Valve: The tricuspid valve is normal in structure. Tricuspid valve regurgitation is mild . No evidence of tricuspid stenosis. Aortic Valve: The aortic valve has been repaired/replaced. Aortic valve regurgitation is trivial. No aortic stenosis is present. Aortic valve mean gradient measures 8.8 mmHg. Aortic valve peak gradient measures 16.6 mmHg. Aortic valve area, by VTI measures 1.74 cm. There is a 23 mm Sapien prosthetic, stented (TAVR) valve present in the aortic position. Echo findings are consistent with normal structure and function of the aortic valve prosthesis. Pulmonic Valve: The pulmonic valve was normal in structure. Pulmonic valve regurgitation is not visualized. No evidence of pulmonic stenosis. Aorta: The aortic root is normal in size and structure. Venous: The inferior vena cava is normal in size with greater than 50% respiratory variability, suggesting right atrial pressure of 3 mmHg. IAS/Shunts: No atrial level shunt detected by color flow Doppler. Additional Comments: 3D was performed not requiring image post processing on an independent workstation and was abnormal.  LEFT VENTRICLE PLAX 2D LVIDd:         5.70 cm         Diastology LVIDs:         4.90 cm         LV e' medial:    4.68 cm/s LV PW:         0.90 cm         LV E/e' medial:  21.2 LV IVS:        0.80 cm         LV e' lateral:   3.98 cm/s LVOT diam:     1.90 cm         LV E/e' lateral: 25.0 LV SV:         69 LV SV Index:   47              2D Longitudinal LVOT Area:     2.84 cm        Strain                                2D Strain GLS   10.2 %                                (A4C): LV  Volumes (MOD)               2D Strain GLS   10.3 % LV vol d, MOD    185.0 ml      (A3C): A2C:                           2D Strain GLS   15.1 % LV vol d, MOD    123.0 ml      (A2C): A4C:                           2D Strain  GLS   -11.9 % LV vol s, MOD    125.0 ml      Avg: A2C: LV vol s, MOD    80.0 ml       3D Volume EF A4C:                           LV 3D EF:    Left LV SV MOD A2C:   60.0 ml                    ventricul LV SV MOD A4C:   123.0 ml                   ar LV SV MOD BP:    57.3 ml                    ejection                                             fraction                                             by 3D                                             volume is                                             30 %.                                 3D Volume EF:                                3D EF:        30 %                                LV EDV:       104 ml                                LV ESV:       73 ml                                LV SV:        31 ml RIGHT VENTRICLE RV S prime:     9.90 cm/s TAPSE (M-mode): 2.4 cm LEFT ATRIUM              Index        RIGHT ATRIUM           Index LA diam:  4.20 cm  2.82 cm/m   RA Area:     16.60 cm LA Vol (A2C):   104.0 ml 69.91 ml/m  RA Volume:   45.60 ml  30.65 ml/m LA Vol (A4C):   76.6 ml  51.49 ml/m LA Biplane Vol: 94.1 ml  63.25 ml/m  AORTIC VALVE AV Area (Vmax):    1.99 cm AV Area (Vmean):   1.88 cm AV Area (VTI):     1.74 cm AV Vmax:           203.60 cm/s AV Vmean:          137.000 cm/s AV VTI:            0.400 m AV Peak Grad:      16.6 mmHg AV Mean Grad:      8.8 mmHg LVOT Vmax:         143.00 cm/s LVOT Vmean:        90.800 cm/s LVOT VTI:          0.245 m LVOT/AV VTI ratio: 0.61  AORTA Ao Root diam: 2.95 cm Ao Asc diam:  3.40 cm MITRAL VALVE MV Area (PHT): 6.04 cm    SHUNTS MV Decel Time: 126 msec    Systemic VTI:  0.24 m MV E velocity: 99.45 cm/s  Systemic Diam: 1.90 cm MV A velocity: 45.85 cm/s MV E/A ratio:  2.17 Lamar Fitch MD  Electronically signed by Lamar Fitch MD Signature Date/Time: 11/30/2023/12:15:14 PM    Final     EKG: on arrival showed NSR with intermittent 2:1 AV block (personally reviewed)  TELEMETRY: Intermittent second degree AV block 30-60s (personally reviewed)  Assessment/Plan:  Second degree AV block Syncope With baseline LBBB and 1st degree AV block.  No clear reversible cause.  Amiodarone  for PAF.  Explained risks, benefits, and alternatives to PPM/ICD implantation, including but not limited to bleeding, infection, pneumothorax, pericardial effusion, lead dislodgement, heart attack, stroke, or death.  Pt verbalized understanding and agrees to proceed.    OAC held  NICM ? CRT-P given advanced age and LBBB Can also discuss CRT-D, but discussed limited data in primary prevention in her age group.   S/p TAVR Otherwise stable.    For questions or updates, please contact [facility] Please consult [email or web address] for contact info under     Signed, Ozell Prentice Passey, PA-C  12/03/2023, 8:40 AM    I have seen, examined the patient, and reviewed the above assessment and plan.    HPI: Ms. 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 ECG demonstrated 2:1 AV block. EP has been consulted for pacemaker implant.   General: Well developed, in no acute distress.  Neck: No JVD.  Cardiac: Bradycardic, regular rhythm.  Resp: Normal work of breathing.  Ext: No edema.  Neuro: No gross focal deficits.   QRS wider following TAVR. Psych: Normal affect.   Echo 11/27/23:   1. Left ventricular ejection fraction, by estimation, is 25 to 30%. Left  ventricular ejection fraction by 3D volume is 30 %. The left ventricle has  severely decreased function. The left ventricle  has no regional wall  motion abnormalities. Left  ventricular diastolic parameters are consistent with Grade II diastolic  dysfunction (pseudonormalization). The average left ventricular global  longitudinal strain is -11.9 %. The global longitudinal strain is  abnormal.   2. Right ventricular systolic function is normal. The right ventricular  size is normal.   3. Left atrial size was severely dilated.   4. The mitral valve is normal in structure. Mild mitral valve  regurgitation. No evidence of mitral stenosis.   5. The aortic valve has been repaired/replaced. Aortic valve  regurgitation is trivial. No aortic stenosis is present. There is a 23 mm  Sapien prosthetic (TAVR) valve present in the aortic position. Echo  findings are consistent with normal structure and  function of the aortic valve prosthesis. Aortic valve mean gradient  measures 8.8 mmHg.   6. The inferior vena cava is normal in size with greater than 50%  respiratory variability, suggesting right atrial pressure of 3 mmHg.    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  Has chronic left bundle branch block and long first-degree AV delay.  QRS does appear wider following TAVR.  She presents with symptomatic bradycardia and second-degree AV block.  She also has a history of atrial fibrillation for which she requires amiodarone ; thus, also caring diagnosis of tachycardia-bradycardia syndrome.  For these reasons, she meets criteria for permanent pacemaker implant.  Given her history of chronic systolic heart failure in the presence of left bundle branch block and with anticipation of significant pacing burden, we will pursue CRT-P implant.     #. Symptomatic bradycardia / tachycardia-bradycardia syndrome #. Second degree AV block #. LBBB #. First degree AV block #. Chronic systolic heart failure  Plan:  - CRT-P implant today. Explained risks, benefits, and alternatives to pacemaker implantation, including but not limited to bleeding, infection, damage to heart or lungs, heart attack, stroke, or death.  Pt verbalized understanding and agrees to proceed if indicated.    [name], MD 12/03/2023 12:29 PM

## 2023-12-03 NOTE — Interval H&P Note (Signed)
 History and Physical Interval Note:  12/03/2023 12:41 PM  Tara Hardin  has presented today for surgery, with the diagnosis of 2nd degree heart block and symptomatic bradycardia.  The various methods of treatment have been discussed with the patient and family. After consideration of risks, benefits and other options for treatment, the patient has consented to  Procedure(s): PACEMAKER IMPLANT (N/A) as a surgical intervention.  The patient's history has been reviewed, patient examined, no change in status, stable for surgery.  I have reviewed the patient's chart and labs.  Questions were answered to the patient's satisfaction.     [name]

## 2023-12-03 NOTE — H&P (View-Only) (Signed)
 ELECTROPHYSIOLOGY CONSULT NOTE    Patient ID: Tara Hardin MRN: [id number], DOB/AGE: 82/11/1940 81 y.o.  Admit date: 12/02/2023 Date of Consult: 12/03/2023  Primary Physician: Claudene Pellet, MD Primary Cardiologist: Lonni Cash, MD  Electrophysiologist: Previously consulted by Dr. Cindie, but no follow up   Referring Provider: Dr. Charlott  Patient Profile: Tara Hardin is a 83 y.o. 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 Charlott.  HPI:  [name] is a 83 y.o. female with history as above.   Seen by Dr. Cindie 05/2023 with bradycardia s/p [facility] and offered CRT-D but pt deferred.   Seen by AF clinic 06/20/2023 and 09/17/2023 for amiodarone  maintenance.   Presented 7/20 for syncope and was found to have 2:1 HB in the ED with HRs as low as 37 bpm. EP asked to see for PPM consideration.   Feeling OK today. Willing to proceed with pacemaker. Had syncope in June as well after working out in the garden. Attributed it to heat, but Zio order. No results available.   Labs Potassium3.8 (07/21 9486) Magnesium   2.1 (07/20 1705) Creatinine, ser  1.71* (07/21 0513) PLT  162 (07/21 0513) HGB  10.0* (07/21 0513) WBC 8.4 (07/21 0513)  .    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 Susy and Dr. Lucas.   Severe aortic stenosis      Surgical History:  Past Surgical History:  Procedure Laterality Date   BREAST LUMPECTOMY WITH RADIOACTIVE SEED LOCALIZATION Left 08/11/2021   Procedure: LEFT BREAST LUMPECTOMY WITH RADIOACTIVE SEED LOCALIZATION X2;  Surgeon: Curvin Deward MOULD, MD;  Location: [facility];  Service: General;  Laterality: Left;   CARDIOVERSION N/A 05/15/2023   Procedure: CARDIOVERSION;  Surgeon: Rolan Ezra RAMAN, MD;  Location: [facility] INVASIVE CV LAB;  Service: Cardiovascular;  Laterality: N/A;   INTRAOPERATIVE TRANSTHORACIC ECHOCARDIOGRAM N/A 09/11/2023   Procedure: ECHOCARDIOGRAM, TRANSTHORACIC;  Surgeon: Cash Lonni BIRCH, MD;  Location: [facility] INVASIVE CV LAB;  Service: Cardiovascular;  Laterality: N/A;   RE-EXCISION OF BREAST CANCER,SUPERIOR MARGINS Left 10/07/2021   Procedure: RE-EXCISION OF LEFT BREAST INFERIOR MARGIN;  Surgeon: Curvin Deward MOULD, MD;  Location: [facility];  Service: General;  Laterality: Left;   RIGHT HEART CATH AND CORONARY ANGIOGRAPHY N/A 05/14/2023   Procedure: RIGHT HEART CATH AND CORONARY ANGIOGRAPHY;  Surgeon: Rolan Ezra RAMAN, MD;  Location: [facility] INVASIVE CV LAB;  Service: Cardiovascular;  Laterality: N/A;   TRANSESOPHAGEAL ECHOCARDIOGRAM (CATH LAB) N/A 05/15/2023   Procedure: TRANSESOPHAGEAL ECHOCARDIOGRAM;  Surgeon: Rolan Ezra RAMAN, MD;  Location: [facility] INVASIVE CV LAB;  Service: Cardiovascular;  Laterality: N/A;     Medications Prior  to Admission  Medication Sig Dispense Refill Last Dose/Taking   amiodarone  (PACERONE ) 200 MG tablet Take 1 tablet (200 mg total) by mouth daily. (Patient taking differently: Take 200 mg by mouth in the morning.) 90 tablet 3 12/02/2023   apixaban  (ELIQUIS ) 2.5 MG TABS  tablet Take 2.5 mg by mouth 2 (two) times daily.   12/02/2023 at  2:00 PM   bismuth  subsalicylate (PEPTO BISMOL) 262 MG/15ML suspension Take 30 mLs by mouth every 6 (six) hours as needed for indigestion or diarrhea or loose stools.   Unknown   CALCIUM PO Take 1 tablet by mouth daily.   12/01/2023   furosemide  (LASIX ) 20 MG tablet Take 20 mg by mouth in the morning.   12/02/2023   letrozole  (FEMARA ) 2.5 MG tablet Take 1 tablet (2.5 mg total) by mouth daily. (Patient taking differently: Take 2.5 mg by mouth in the morning.) 90 tablet 3 12/02/2023   Multiple Vitamin (MULTIVITAMIN PO) Take 1 tablet by mouth in the morning.   Past Week   Omega-3 Fatty Acids (FISH OIL PO) Take 1 tablet by mouth every other day. Alternate between Fish Oil and Red Yeast Rice Tablets   Past Week   Red Yeast Rice Extract (RED YEAST RICE PO) Take 1 tablet by mouth every other day. Alternate between Red Yeast Rice Tablets and Fish Oil.   Past Week   sodium chloride  (OCEAN) 0.65 % nasal spray Place 1 spray into the nose every 8 (eight) hours as needed (Dry nasal passages).   Unknown   spironolactone  (ALDACTONE ) 25 MG tablet Take 12.5 mg by mouth in the morning.   12/02/2023    Inpatient Medications:   amiodarone   200 mg Oral Daily   letrozole   2.5 mg Oral Daily    Allergies: No Known Allergies  Family History  Problem Relation Age of Onset   Lung cancer Mother        d. 49   Heart attack Sister    Hypertension Sister    Atrial fibrillation Brother    Hypertension Brother    Heart attack Brother    Irregular heart beat Brother    Breast cancer Paternal Aunt        dx after 76   Breast cancer Cousin        paternal female cousin     Physical Exam: Vitals:   12/03/23 0500 12/03/23 0600 12/03/23 0700 12/03/23 0812  BP: (!) 101/51 (!) 116/56 (!) 106/57   Pulse: (!) 47 (!) 56 (!) 56   Resp: 19 (!) 22 (!) 21   Temp:    97.8 F (36.6 C)  TempSrc:    Oral  SpO2: 95% 96% 95%   Weight: 46 kg     Height:         GEN- NAD, A&O x 3, normal affect HEENT: Normocephalic, atraumatic Lungs- CTAB, Normal effort.  Heart- Slow but regular rate and rhythm with occasional skipped beats, No M/G/R.  GI- Soft, NT, ND.  Extremities- No clubbing, cyanosis, or edema   Radiology/Studies: DG Chest Portable 1 View Result Date: 12/02/2023 CLINICAL DATA:  Syncopal episode. EXAM: PORTABLE CHEST 1 VIEW COMPARISON:  09/07/2023 and older studies. FINDINGS: Stable enlargement of cardiac silhouette. TAVR performed since the previous chest radiograph. No mediastinal or hilar masses. Clear lungs.  No pleural effusion or pneumothorax. Skeletal structures are grossly intact. IMPRESSION: 1. No acute cardiopulmonary disease. Electronically Signed   By: Alm Parkins M.D.   On: 12/02/2023 18:03   ECHOCARDIOGRAM COMPLETE  Result Date: 11/30/2023    ECHOCARDIOGRAM REPORT   Patient Name:   ERNESHA RAMONE Date of Exam: 11/27/2023 Medical Rec #:  [id number]      Height:       64.0 in Accession #:    [id number]     Weight:       105.0 lb Date of Birth:  [birth year]       BSA:          1.488 m Patient Age:    82 years       BP:           110/70 mmHg Patient Gender: F              HR:           64 bpm. Exam Location:  [place] Procedure: 2D Echo, 3D Echo, Cardiac Doppler, Color Doppler and Strain Analysis            (Both Spectral and Color Flow Doppler were utilized during            procedure). Indications:    Aortic valve disorder [I35.9 (ICD-10-CM)]  History:        Patient has prior history of Echocardiogram examinations, most                 recent 09/12/2023. CHF, Aortic Valve Disease, Arrythmias:Atrial                 Fibrillation; Risk Factors:Dyslipidemia and Non-Smoker.                 Aortic Valve: 23 mm Sapien prosthetic, stented (TAVR) valve is                 present in the aortic position.  Sonographer:    Alan Greenhouse RDMS, RVT, RDCS Referring Phys: [id number] LAMARR SAUNDERS THOMPSON IMPRESSIONS  1. Left ventricular ejection fraction, by  estimation, is 25 to 30%. Left ventricular ejection fraction by 3D volume is 30 %. The left ventricle has severely decreased function. The left ventricle has no regional wall motion abnormalities. Left ventricular diastolic parameters are consistent with Grade II diastolic dysfunction (pseudonormalization). The average left ventricular global longitudinal strain is -11.9 %. The global longitudinal strain is abnormal.  2. Right ventricular systolic function is normal. The right ventricular size is normal.  3. Left atrial size was severely dilated.  4. The mitral valve is normal in structure. Mild mitral valve regurgitation. No evidence of mitral stenosis.  5. The aortic valve has been repaired/replaced. Aortic valve regurgitation is trivial. No aortic stenosis is present. There is a 23 mm Sapien prosthetic (TAVR) valve present in the aortic position. Echo findings are consistent with normal structure and function of the aortic valve prosthesis. Aortic valve mean gradient measures 8.8 mmHg.  6. The inferior vena cava is normal in size with greater than 50% respiratory variability, suggesting right atrial pressure of 3 mmHg. Comparison(s): Echocardiogram done 09/12/23 showed an EF of 25-30% with an AV Mean Grad of 10 mmHg. FINDINGS  Left Ventricle: Left ventricular ejection fraction, by estimation, is 25 to 30%. Left ventricular ejection fraction by 3D volume is 30 %. The left ventricle has severely decreased function. The left ventricle has no regional wall motion abnormalities. The average left ventricular global longitudinal strain is -11.9 %. Strain was performed and the global longitudinal strain is abnormal. The left ventricular internal cavity size was normal in size. There is no left ventricular hypertrophy. Left ventricular diastolic parameters are  consistent with Grade II diastolic dysfunction (pseudonormalization). Right Ventricle: The right ventricular size is normal. No increase in right ventricular wall  thickness. Right ventricular systolic function is normal. Left Atrium: Left atrial size was severely dilated. Right Atrium: Right atrial size was normal in size. Pericardium: There is no evidence of pericardial effusion. Mitral Valve: The mitral valve is normal in structure. Mild mitral valve regurgitation. No evidence of mitral valve stenosis. Tricuspid Valve: The tricuspid valve is normal in structure. Tricuspid valve regurgitation is mild . No evidence of tricuspid stenosis. Aortic Valve: The aortic valve has been repaired/replaced. Aortic valve regurgitation is trivial. No aortic stenosis is present. Aortic valve mean gradient measures 8.8 mmHg. Aortic valve peak gradient measures 16.6 mmHg. Aortic valve area, by VTI measures 1.74 cm. There is a 23 mm Sapien prosthetic, stented (TAVR) valve present in the aortic position. Echo findings are consistent with normal structure and function of the aortic valve prosthesis. Pulmonic Valve: The pulmonic valve was normal in structure. Pulmonic valve regurgitation is not visualized. No evidence of pulmonic stenosis. Aorta: The aortic root is normal in size and structure. Venous: The inferior vena cava is normal in size with greater than 50% respiratory variability, suggesting right atrial pressure of 3 mmHg. IAS/Shunts: No atrial level shunt detected by color flow Doppler. Additional Comments: 3D was performed not requiring image post processing on an independent workstation and was abnormal.  LEFT VENTRICLE PLAX 2D LVIDd:         5.70 cm         Diastology LVIDs:         4.90 cm         LV e' medial:    4.68 cm/s LV PW:         0.90 cm         LV E/e' medial:  21.2 LV IVS:        0.80 cm         LV e' lateral:   3.98 cm/s LVOT diam:     1.90 cm         LV E/e' lateral: 25.0 LV SV:         69 LV SV Index:   47              2D Longitudinal LVOT Area:     2.84 cm        Strain                                2D Strain GLS   10.2 %                                (A4C): LV  Volumes (MOD)               2D Strain GLS   10.3 % LV vol d, MOD    185.0 ml      (A3C): A2C:                           2D Strain GLS   15.1 % LV vol d, MOD    123.0 ml      (A2C): A4C:                           2D Strain  GLS   -11.9 % LV vol s, MOD    125.0 ml      Avg: A2C: LV vol s, MOD    80.0 ml       3D Volume EF A4C:                           LV 3D EF:    Left LV SV MOD A2C:   60.0 ml                    ventricul LV SV MOD A4C:   123.0 ml                   ar LV SV MOD BP:    57.3 ml                    ejection                                             fraction                                             by 3D                                             volume is                                             30 %.                                 3D Volume EF:                                3D EF:        30 %                                LV EDV:       104 ml                                LV ESV:       73 ml                                LV SV:        31 ml RIGHT VENTRICLE RV S prime:     9.90 cm/s TAPSE (M-mode): 2.4 cm LEFT ATRIUM              Index        RIGHT ATRIUM           Index LA diam:  4.20 cm  2.82 cm/m   RA Area:     16.60 cm LA Vol (A2C):   104.0 ml 69.91 ml/m  RA Volume:   45.60 ml  30.65 ml/m LA Vol (A4C):   76.6 ml  51.49 ml/m LA Biplane Vol: 94.1 ml  63.25 ml/m  AORTIC VALVE AV Area (Vmax):    1.99 cm AV Area (Vmean):   1.88 cm AV Area (VTI):     1.74 cm AV Vmax:           203.60 cm/s AV Vmean:          137.000 cm/s AV VTI:            0.400 m AV Peak Grad:      16.6 mmHg AV Mean Grad:      8.8 mmHg LVOT Vmax:         143.00 cm/s LVOT Vmean:        90.800 cm/s LVOT VTI:          0.245 m LVOT/AV VTI ratio: 0.61  AORTA Ao Root diam: 2.95 cm Ao Asc diam:  3.40 cm MITRAL VALVE MV Area (PHT): 6.04 cm    SHUNTS MV Decel Time: 126 msec    Systemic VTI:  0.24 m MV E velocity: 99.45 cm/s  Systemic Diam: 1.90 cm MV A velocity: 45.85 cm/s MV E/A ratio:  2.17 Lamar Fitch MD  Electronically signed by Lamar Fitch MD Signature Date/Time: 11/30/2023/12:15:14 PM    Final     EKG: on arrival showed NSR with intermittent 2:1 AV block (personally reviewed)  TELEMETRY: Intermittent second degree AV block 30-60s (personally reviewed)  Assessment/Plan:  Second degree AV block Syncope With baseline LBBB and 1st degree AV block.  No clear reversible cause.  Amiodarone  for PAF.  Explained risks, benefits, and alternatives to PPM/ICD implantation, including but not limited to bleeding, infection, pneumothorax, pericardial effusion, lead dislodgement, heart attack, stroke, or death.  Pt verbalized understanding and agrees to proceed.    OAC held  NICM ? CRT-P given advanced age and LBBB Can also discuss CRT-D, but discussed limited data in primary prevention in her age group.   S/p TAVR Otherwise stable.    For questions or updates, please contact [facility] Please consult [email or web address] for contact info under     Signed, Ozell Prentice Passey, PA-C  12/03/2023, 8:40 AM    I have seen, examined the patient, and reviewed the above assessment and plan.    HPI: Ms. 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 ECG demonstrated 2:1 AV block. EP has been consulted for pacemaker implant.   General: Well developed, in no acute distress.  Neck: No JVD.  Cardiac: Bradycardic, regular rhythm.  Resp: Normal work of breathing.  Ext: No edema.  Neuro: No gross focal deficits.   QRS wider following TAVR. Psych: Normal affect.   Echo 11/27/23:   1. Left ventricular ejection fraction, by estimation, is 25 to 30%. Left  ventricular ejection fraction by 3D volume is 30 %. The left ventricle has  severely decreased function. The left ventricle  has no regional wall  motion abnormalities. Left  ventricular diastolic parameters are consistent with Grade II diastolic  dysfunction (pseudonormalization). The average left ventricular global  longitudinal strain is -11.9 %. The global longitudinal strain is  abnormal.   2. Right ventricular systolic function is normal. The right ventricular  size is normal.   3. Left atrial size was severely dilated.   4. The mitral valve is normal in structure. Mild mitral valve  regurgitation. No evidence of mitral stenosis.   5. The aortic valve has been repaired/replaced. Aortic valve  regurgitation is trivial. No aortic stenosis is present. There is a 23 mm  Sapien prosthetic (TAVR) valve present in the aortic position. Echo  findings are consistent with normal structure and  function of the aortic valve prosthesis. Aortic valve mean gradient  measures 8.8 mmHg.   6. The inferior vena cava is normal in size with greater than 50%  respiratory variability, suggesting right atrial pressure of 3 mmHg.    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  Has chronic left bundle branch block and long first-degree AV delay.  QRS does appear wider following TAVR.  She presents with symptomatic bradycardia and second-degree AV block.  She also has a history of atrial fibrillation for which she requires amiodarone ; thus, also caring diagnosis of tachycardia-bradycardia syndrome.  For these reasons, she meets criteria for permanent pacemaker implant.  Given her history of chronic systolic heart failure in the presence of left bundle branch block and with anticipation of significant pacing burden, we will pursue CRT-P implant.     #. Symptomatic bradycardia / tachycardia-bradycardia syndrome #. Second degree AV block #. LBBB #. First degree AV block #. Chronic systolic heart failure  Plan:  - CRT-P implant today. Explained risks, benefits, and alternatives to pacemaker implantation, including but not limited to bleeding, infection, damage to heart or lungs, heart attack, stroke, or death.  Pt verbalized understanding and agrees to proceed if indicated.    [name], MD 12/03/2023 12:29 PM

## 2023-12-03 NOTE — Discharge Instructions (Signed)
 After Your Pacemaker   You have a [employer] Pacemaker  ACTIVITY Do not lift your arm above shoulder height for 1 week after your procedure. After 7 days, you may progress as below.  You should remove your sling 24 hours after your procedure, unless otherwise instructed by your provider.     Monday December 10, 2023  Tuesday December 11, 2023 Wednesday December 12, 2023 Thursday December 13, 2023   Do not lift, push, pull, or carry anything over 10 pounds with the affected arm until 6 weeks (Monday January 14, 2024 ) after your procedure.   You may drive AFTER your wound check, unless you have been told otherwise by your provider.   Ask your healthcare provider when you can go back to work   INCISION/Dressing If you are on a blood thinner such as Coumadin, Xarelto, Eliquis , Plavix, or Pradaxa please confirm with your provider when this should be resumed.   If large square, outer bandage is left in place, this can be removed after 24 hours from your procedure. Do not remove steri-strips or glue as below.   If a PRESSURE DRESSING (a bulky dressing that usually goes up over your shoulder) was applied or left in place, please follow instructions given by your provider on when to return to have this removed.   Monitor your Pacemaker site for redness, swelling, and drainage. Call the device clinic at [phone] if you experience these symptoms or fever/chills.  If your incision is sealed with Steri-strips or staples, you may shower 7 days after your procedure or when told by your provider. Do not remove the steri-strips or let the shower hit directly on your site. You may wash around your site with soap and water.    If you were discharged in a sling, please do not wear this during the day more than 48 hours after your surgery unless otherwise instructed. This may increase the risk of stiffness and soreness in your shoulder.   Avoid lotions, ointments, or perfumes over your incision until it is  well-healed.  You may use a hot tub or a pool AFTER your wound check appointment if the incision is completely closed.  Pacemaker Alerts:  Some alerts are vibratory and others beep. These are NOT emergencies. Please call our office to let us  know. If this occurs at night or on weekends, it can wait until the next business day. Send a remote transmission.  If your device is capable of reading fluid status (for heart failure), you will be offered monthly monitoring to review this with you.   DEVICE MANAGEMENT Remote monitoring is used to monitor your pacemaker from home. This monitoring is scheduled every 91 days by our office. It allows us  to keep an eye on the functioning of your device to ensure it is working properly. You will routinely see your Electrophysiologist annually (more often if necessary).   You should receive your ID card for your new device in 4-8 weeks. Keep this card with you at all times once received. Consider wearing a medical alert bracelet or necklace.  Your Pacemaker may be MRI compatible. This will be discussed at your next office visit/wound check.  You should avoid contact with strong electric or magnetic fields.   Do not use amateur (ham) radio equipment or electric (arc) welding torches. MP3 player headphones with magnets should not be used. Some devices are safe to use if held at least 12 inches (30 cm) from your Pacemaker. These include power tools, lawn  mowers, and speakers. If you are unsure if something is safe to use, ask your health care provider.  When using your cell phone, hold it to the ear that is on the opposite side from the Pacemaker. Do not leave your cell phone in a pocket over the Pacemaker.  You may safely use electric blankets, heating pads, computers, and microwave ovens.  Call the office right away if: You have chest pain. You feel more short of breath than you have felt before. You feel more light-headed than you have felt before. Your  incision starts to open up.  This information is not intended to replace advice given to you by your health care provider. Make sure you discuss any questions you have with your health care provider.

## 2023-12-04 ENCOUNTER — Encounter (HOSPITAL_COMMUNITY): Payer: Self-pay | Admitting: Cardiology

## 2023-12-04 ENCOUNTER — Other Ambulatory Visit (HOSPITAL_COMMUNITY)

## 2023-12-04 ENCOUNTER — Inpatient Hospital Stay (HOSPITAL_COMMUNITY)

## 2023-12-04 MED ORDER — ACETAMINOPHEN 325 MG PO TABS: 325.0000 mg | ORAL_TABLET | ORAL | Status: AC | PRN

## 2023-12-04 MED FILL — Midazolam HCl Inj 2 MG/2ML (Base Equivalent): INTRAMUSCULAR | Qty: 3 | Status: AC

## 2023-12-04 NOTE — Discharge Summary (Cosign Needed)
 ELECTROPHYSIOLOGY PROCEDURE DISCHARGE SUMMARY    Patient ID: Tara Hardin,  MRN: [id number], DOB/AGE: 1941-02-12 83 y.o.  Admit date: 12/02/2023 Discharge date: 12/04/2023  Primary Care Physician: Claudene Pellet, MD  Primary Cardiologist: Lonni Cash, MD  Electrophysiologist: Dr. 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  Implantation of a Abbott Dual Chamber PPM on 12/03/2023 by Dr. Kennyth. The patient received a Abbott Assurity [id number] with a Abbott Ultipace 1231-52 right atrial lead and a Abbott Ultipace 1231-65 right ventricular lead.  There were no immediate post procedure complications.   2.  CXR on 12/04/2023 demonstrated no pneumothorax status post device implantation.       Brief HPI: Tara Hardin is a 83 y.o. female was admitted for symptomatic bradycardia and syncope and electrophysiology team asked to see for consideration of PPM implantation.  Past medical history includes above.  The patient has had AV block without reversible causes identified.  Risks, benefits, and alternatives to PPM implantation were reviewed with the patient who wished to proceed.   Hospital Course:  The patient was admitted and underwent implantation of a Abbott dual chamber PPM with details as outlined above.  She was monitored on telemetry overnight which demonstrated appropriate pacing.  Left chest was without hematoma or ecchymosis.  The device was interrogated and found to be functioning normally.  CXR was obtained and demonstrated no pneumothorax status post device implantation.  Wound care, arm mobility, and restrictions were reviewed with the patient.  The patient was examined and considered stable for discharge to home.    Anticoagulation  resumption This patient should resume their Eliquis  on Thursday December 06, 2023    Physical Exam: Vitals:   12/04/23 0400 12/04/23 0500 12/04/23 0600 12/04/23 0700  BP: (!) 120/54 (!) 126/58 (!) 132/57 132/61  Pulse: 60 60 67 65  Resp: 18 18 20  (!) 21  Temp: 98.3 F (36.8 C)     TempSrc: Axillary     SpO2: 96% 95% 96% 96%  Weight:  46.8 kg    Height:        GEN- NAD. A&O x 3.  HEENT: Normocephalic, atraumatic Lungs- CTAB, Normal effort.  Heart- RRR, No M/G/R.  GI- Soft, NT, ND.  Extremities- No clubbing, cyanosis, or edema;  Skin- warm and dry, no rash or lesion, left chest without hematoma/ecchymosis  Discharge Medications:  Allergies as of 12/04/2023   No Known Allergies      Medication List     PAUSE taking these medications    apixaban  2.5 MG Tabs tablet Wait to take this until: December 06, 2023 Evening Commonly known as: ELIQUIS  Take 2.5 mg by mouth 2 (two) times daily.       TAKE these medications    acetaminophen  325 MG tablet Commonly known as: TYLENOL  Take 1-2 tablets (325-650 mg total) by mouth every 4 (four) hours as needed for mild pain (pain score 1-3).   amiodarone  200 MG tablet Commonly known as: PACERONE  Take 1 tablet (200 mg total) by mouth daily. What changed: when to take this   bismuth  subsalicylate 262 MG/15ML suspension Commonly known as: PEPTO BISMOL Take 30 mLs by mouth every 6 (six) hours as needed for indigestion or diarrhea or loose stools.   CALCIUM PO Take 1 tablet by mouth  daily.   FISH OIL PO Take 1 tablet by mouth every other day. Alternate between Fish Oil and Red Yeast Rice Tablets   furosemide  20 MG tablet Commonly known as: LASIX  Take 20 mg by mouth in the morning.   letrozole  2.5 MG tablet Commonly known as: FEMARA  Take 1 tablet (2.5 mg total) by mouth daily. What changed: when to take this   MULTIVITAMIN PO Take 1 tablet by mouth in the morning.   RED YEAST RICE PO Take 1 tablet by mouth every other day.  Alternate between Red Yeast Rice Tablets and Fish Oil.   sodium chloride  0.65 % nasal spray Commonly known as: OCEAN Place 1 spray into the nose every 8 (eight) hours as needed (Dry nasal passages).   spironolactone  25 MG tablet Commonly known as: ALDACTONE  Take 12.5 mg by mouth in the morning.        Disposition:  Home with usual follow up as in AVS  Signed, Ozell Prentice Passey, PA-C  12/04/2023 7:40 AM  I have seen, examined the patient, and reviewed the above assessment and plan.    Hospital course: Patient presented to ED with history of syncope.  Was found to be in 2-1 AV block.  Patient underwent dual-chamber pacemaker implant on 11/13/2023.  She was monitored overnight.  There were no acute complications.  On the morning of discharge, she reported feeling relatively well with no new or acute complaints.  General: Well developed, in no acute distress.  Neck: No JVD.  Cardiac: Normal rate, regular rhythm.  Left chest pacer pocket without bleeding or hematoma. Resp: Normal work of breathing.  Ext: No edema.  Neuro: No gross focal deficits.  Psych: Normal affect.   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  Has chronic left bundle branch block and long first-degree AV delay.  QRS does appear wider following TAVR.  She presents with symptomatic bradycardia and second-degree AV block.  She also has a history of atrial fibrillation for which she requires amiodarone ; thus, also caring diagnosis of tachycardia-bradycardia syndrome.  For these reasons, she meets criteria for permanent pacemaker implant.  Given her history of chronic systolic heart failure in the presence of left bundle branch block and with anticipation of significant pacing burden, we plans to pursue CRT-P implant.  Unfortunately, patient did not have any CS branches that were amenable to placement of coronary sinus pacing lead.  She did receive a dual-chamber pacemaker with a left  bundle branch area pacing lead and evidence of left-sided conduction system capture.   #. Symptomatic bradycardia / tachycardia-bradycardia syndrome #. Second degree AV block #. LBBB #. First degree AV block #. Chronic systolic heart failure   Plan:  - Chest x-ray with appropriate/stable lead positions. - Bedside device interrogation was performed with appropriate device function and stable lead parameters. - Usual post implant instructions regarding activity restrictions and wound care were provided. - Resume oral anticoagulation in 72 hours. - Follow-up in device clinic in approximately 10 to 14 days and with me in 3 months.  Duration of Discharge Encounter: 45 minutes  [name], MD 12/05/2023 8:48 AM

## 2023-12-04 NOTE — TOC CM/SW Note (Addendum)
 Referral received to assist pt with [facility] PT/aide. Met with pt and daughter, Lani, to discuss the DC plan and HH. Pt lives alone. Lani lives nearby. Norton is planning to stay with her mother at time of DC. Pt reports that she was independent prior to this admission. She reports that she feels weak after the procedure. They are agreeable with [facility] services. Pt prefers to use [facility] She reports that her husband has used [facility] and they liked them. She requested the same therapist, Sandra. Contacted Amy with [facility] and she accepted the referral. Informed Amy that the pt requested Sandra for therapist.

## 2023-12-07 ENCOUNTER — Telehealth: Payer: Self-pay

## 2023-12-07 NOTE — Telephone Encounter (Signed)
 Follow-up after same day discharge: Implant date: 12/03/2023 MD: [name] Device: Abbott PPM Location: Left Chest   Wound check visit: 7/31 90 day MD follow-up: 10/28  Remote Transmission received:No   Dressing/sling removed: yes  Confirm OAC restart on: yes  Please continue to monitor your cardiac device site for redness, swelling, and drainage. Call the device clinic at [phone] if you experience these symptoms, fever/chills, or have questions about your device.   Remote monitoring is used to monitor your cardiac device from home. This monitoring is scheduled every 91 days by our office. It allows us  to keep an eye on the functioning of your device to ensure it is working properly.  I spoke with the pt and her daughter. She is doing well.I answered all their questions.

## 2023-12-13 ENCOUNTER — Ambulatory Visit: Attending: Cardiology

## 2023-12-13 DIAGNOSIS — I5022 Chronic systolic (congestive) heart failure: Secondary | ICD-10-CM

## 2023-12-13 LAB — CUP PACEART INCLINIC DEVICE CHECK
Battery Remaining Longevity: 129 mo
Battery Voltage: 3.08 V
Brady Statistic RA Percent Paced: 27 %
Brady Statistic RV Percent Paced: 99.92 %
Date Time Interrogation Session: 20250731164015
Implantable Lead Connection Status: 753985
Implantable Lead Connection Status: 753985
Implantable Lead Implant Date: 20250721
Implantable Lead Implant Date: 20250721
Implantable Lead Location: 753859
Implantable Lead Location: 753860
Implantable Pulse Generator Implant Date: 20250721
Lead Channel Impedance Value: 400 Ohm
Lead Channel Impedance Value: 487.5 Ohm
Lead Channel Pacing Threshold Amplitude: 0.5 V
Lead Channel Pacing Threshold Amplitude: 0.625 V
Lead Channel Pacing Threshold Pulse Width: 0.5 ms
Lead Channel Pacing Threshold Pulse Width: 0.5 ms
Lead Channel Sensing Intrinsic Amplitude: 10.4 mV
Lead Channel Sensing Intrinsic Amplitude: 2.4 mV
Lead Channel Setting Pacing Amplitude: 0.75 V
Lead Channel Setting Pacing Amplitude: 1.625
Lead Channel Setting Pacing Pulse Width: 0.5 ms
Lead Channel Setting Sensing Sensitivity: 2 mV
Pulse Gen Model: 2272
Pulse Gen Serial Number: 8294240

## 2023-12-13 NOTE — Progress Notes (Signed)
 Normal dual chamber pacemaker wound check. Presenting rhythm: AS/VP 78 . Wound well healed. Routine testing performed. Thresholds, sensing, and impedances consistent with implant measurements. AMS episodes noted c/w RA FFOS. AT/AF burden <1% on OAC per EPIC. Reviewed arm restrictions to continue for 6 weeks total post op.  Pt enrolled in remote follow-up.

## 2023-12-13 NOTE — Patient Instructions (Signed)

## 2023-12-16 ENCOUNTER — Ambulatory Visit: Payer: Self-pay | Admitting: Cardiology

## 2023-12-26 ENCOUNTER — Telehealth: Payer: Self-pay | Admitting: Cardiovascular Disease

## 2023-12-26 NOTE — Telephone Encounter (Signed)
 Pt c/o medication issue:  1. Name of Medication: spironolactone  (ALDACTONE ) 25 MG tablet   2. How are you currently taking this medication (dosage and times per day)?   Take 12.5 mg by mouth in the morning.    3. Are you having a reaction (difficulty breathing--STAT)? No  4. What is your medication issue? Pt would like a c/b regarding whether she is able to stop taking above medication. Please advise

## 2023-12-26 NOTE — Telephone Encounter (Signed)
 Patient identification verified by 2 forms.  Pt states she has not had any swelling since January. Also states she has lost weight, maybe it's too much going out of my body.  Pt denies any diet changes.

## 2023-12-31 ENCOUNTER — Telehealth (HOSPITAL_COMMUNITY): Payer: Self-pay | Admitting: Cardiology

## 2023-12-31 NOTE — Telephone Encounter (Signed)
 Pt aware Would like to schedule with DB  Advised to call back 10/1 for appt

## 2023-12-31 NOTE — Telephone Encounter (Signed)
 Patient called to report great response with spiro and lasix . Reports her swelling has been down since January. Would like to know if she can stop spiro?  Reports weight is 100-103 everyday Denies swelling or SOB  No follow up scheduled but due 12/2023  Please advise

## 2024-01-03 NOTE — Telephone Encounter (Signed)
 Spoke w patient.  She voices understanding that she is taking spironolactone  for her heart function and she should stay on it.  She is feeling good overall.  Appetite not so great when its hot out, and she will try to increase fluids a little bit because she doesn't get much to drink.  She thought because she didn't have any swelling that she may not need the medicine.    No other needs at this time.  She is grateful for the call today.

## 2024-01-08 ENCOUNTER — Inpatient Hospital Stay: Attending: Hematology and Oncology | Admitting: Hematology and Oncology

## 2024-01-08 VITALS — BP 134/46 | HR 82 | Temp 97.6°F | Resp 16 | Wt 101.6 lb

## 2024-01-08 DIAGNOSIS — C50212 Malignant neoplasm of upper-inner quadrant of left female breast: Secondary | ICD-10-CM | POA: Diagnosis not present

## 2024-01-08 DIAGNOSIS — Z79811 Long term (current) use of aromatase inhibitors: Secondary | ICD-10-CM | POA: Insufficient documentation

## 2024-01-08 DIAGNOSIS — Z950 Presence of cardiac pacemaker: Secondary | ICD-10-CM | POA: Insufficient documentation

## 2024-01-08 DIAGNOSIS — Z1732 Human epidermal growth factor receptor 2 negative status: Secondary | ICD-10-CM | POA: Diagnosis not present

## 2024-01-08 DIAGNOSIS — Z1721 Progesterone receptor positive status: Secondary | ICD-10-CM | POA: Diagnosis not present

## 2024-01-08 DIAGNOSIS — Z7901 Long term (current) use of anticoagulants: Secondary | ICD-10-CM | POA: Insufficient documentation

## 2024-01-08 DIAGNOSIS — Z170 Estrogen receptor positive status [ER+]: Secondary | ICD-10-CM | POA: Insufficient documentation

## 2024-01-08 DIAGNOSIS — Z79899 Other long term (current) drug therapy: Secondary | ICD-10-CM | POA: Insufficient documentation

## 2024-01-08 NOTE — Progress Notes (Signed)
 Patient Care Team: Tara Pellet, MD as PCP - General (Family Medicine) Tara Lonni BIRCH, MD as PCP - Cardiology (Cardiology) Tara Ole DASEN, MD as PCP - Electrophysiology (Cardiology) Tara Lonni BIRCH, MD as PCP - Structural Heart (Cardiology) Tara Deward MOULD, MD as Consulting Physician (General Surgery) Tara Potts, MD as Consulting Physician (Hematology and Oncology) Tara Rush, MD as Consulting Physician (Radiation Oncology)  DIAGNOSIS:  Encounter Diagnosis  Name Primary?   Malignant neoplasm of upper-inner quadrant of left breast in female, estrogen receptor positive (HCC) Yes    SUMMARY OF ONCOLOGIC HISTORY: Oncology History  Malignant neoplasm of upper-inner quadrant of left breast in female, estrogen receptor positive (HCC)  07/18/2021 Initial Diagnosis   Screening mammogram detected mass lower medial quadrant, 2 adjacent masses 1 cm and 0.9 cm (total 1.9 cm) at 9 o'clock position: Biopsy: Grade 2-3 IDC with DCIS, ER 50%, PR 50%, HER2 equivocal, FISH pending, Ki-67 1%   07/20/2021 Cancer Staging   Staging form: Breast, AJCC 8th Edition - Clinical stage from 07/20/2021: Stage IA (cT1b, cN0, cM0, G3, ER+, PR+, HER2-) - Signed by Lanell Donald Stagger, PA-C on 07/20/2021 Stage prefix: Initial diagnosis Method of lymph node assessment: Clinical Histologic grading system: 3 grade system   08/11/2021 Surgery   Left lumpectomy: Grade 2 IDC 2 cm with intermediate grade DCIS, superior margin close of 0.1 cm, DCIS focally involves posterior and inferior margins ER 50%, PR 50%, HER2 negative, Ki-67 1%    10/07/2021 Surgery   Margin reexcision left inferior: Benign   11/24/2021 - 12/15/2021 Radiation Therapy   Adjuvant radiation   12/15/2021 -  Anti-estrogen oral therapy   2.5 mg Letrozole  x 5 years     CHIEF COMPLIANT:   HISTORY OF PRESENT ILLNESS:   History of Present Illness Tara Hardin is an 83 year old female with breast cancer who presents for oncology  follow-up.  She has been on letrozole  for two years and two months without significant issues. There is no breast pain or discomfort, and her last mammogram in April was normal, though her breast tissue is slightly dense.  Five weeks ago, she underwent pacemaker insertion and is still in the recovery phase.  She is scheduled for a bone density test later this week. She maintains her calcium intake through yogurt and supplements, taking at least 600 mg of calcium per day.     ALLERGIES:  has no known allergies.  MEDICATIONS:  Current Outpatient Medications  Medication Sig Dispense Refill   acetaminophen  (TYLENOL ) 325 MG tablet Take 1-2 tablets (325-650 mg total) by mouth every 4 (four) hours as needed for mild pain (pain score 1-3).     amiodarone  (PACERONE ) 200 MG tablet Take 1 tablet (200 mg total) by mouth daily. (Patient taking differently: Take 200 mg by mouth in the morning.) 90 tablet 3   apixaban  (ELIQUIS ) 2.5 MG TABS tablet Take 2.5 mg by mouth 2 (two) times daily.     bismuth  subsalicylate (PEPTO BISMOL) 262 MG/15ML suspension Take 30 mLs by mouth every 6 (six) hours as needed for indigestion or diarrhea or loose stools.     CALCIUM PO Take 1 tablet by mouth daily.     furosemide  (LASIX ) 20 MG tablet Take 20 mg by mouth in the morning.     letrozole  (FEMARA ) 2.5 MG tablet Take 1 tablet (2.5 mg total) by mouth daily. (Patient taking differently: Take 2.5 mg by mouth in the morning.) 90 tablet 3   Multiple Vitamin (MULTIVITAMIN PO) Take  1 tablet by mouth in the morning.     Omega-3 Fatty Acids (FISH OIL PO) Take 1 tablet by mouth every other day. Alternate between Fish Oil and Red Yeast Rice Tablets     Red Yeast Rice Extract (RED YEAST RICE PO) Take 1 tablet by mouth every other day. Alternate between Red Yeast Rice Tablets and Fish Oil.     sodium chloride  (OCEAN) 0.65 % nasal spray Place 1 spray into the nose every 8 (eight) hours as needed (Dry nasal passages).     spironolactone   (ALDACTONE ) 25 MG tablet Take 12.5 mg by mouth in the morning.     No current facility-administered medications for this visit.    PHYSICAL EXAMINATION: ECOG PERFORMANCE STATUS: 1 - Symptomatic but completely ambulatory  There were no vitals filed for this visit. There were no vitals filed for this visit.  Physical Exam   (exam performed in the presence of a chaperone)  LABORATORY DATA:  I have reviewed the data as listed    Latest Ref Rng & Units 12/03/2023    5:13 AM 12/02/2023    5:05 PM 09/13/2023    3:52 AM  CMP  Glucose 70 - 99 mg/dL 883  891  892   BUN 8 - 23 mg/dL 27  26  21    Creatinine 0.44 - 1.00 mg/dL 8.28  8.40  8.83   Sodium 135 - 145 mmol/L 142  138  137   Potassium 3.5 - 5.1 mmol/L 3.8  4.2  4.1   Chloride 98 - 111 mmol/L 111  106  104   CO2 22 - 32 mmol/L 23  23  24    Calcium 8.9 - 10.3 mg/dL 8.4  8.5  8.3   Total Protein 6.5 - 8.1 g/dL  6.6    Total Bilirubin 0.0 - 1.2 mg/dL  0.7    Alkaline Phos 38 - 126 U/L  74    AST 15 - 41 U/L  35    ALT 0 - 44 U/L  21      Lab Results  Component Value Date   WBC 8.4 12/03/2023   HGB 10.0 (L) 12/03/2023   HCT 29.9 (L) 12/03/2023   MCV 96.8 12/03/2023   PLT 162 12/03/2023   NEUTROABS 6.4 12/02/2023    ASSESSMENT & PLAN:  Malignant neoplasm of upper-inner quadrant of left breast in female, estrogen receptor positive (HCC) 08/11/2021:Left lumpectomy: Grade 2 IDC 2 cm with intermediate grade DCIS, superior margin close of 0.1 cm, DCIS focally involves posterior and inferior margins ER 50%, PR 50%, HER2 negative, Ki-67 1% margin reexcision 10/07/2021: Benign   11/24/2021-12/15/2021: Adjuvant radiation   Treatment plan: Adjuvant antiestrogen therapy with letrozole  2.5 mg daily x5 years started August 2023  Letrozole  toxicities: Doing extremely well without any major problems or concerns.  She walks with the help of a walker.  Does not report any hot flashes or joint symptoms.  Breast cancer surveillance: Mammogram at  [facility] 09/07/2023: Benign breast density category C Breast exam 01/08/2024: Benign  Assessment & Plan Breast cancer on adjuvant endocrine therapy Breast cancer managed with letrozole  for two years and two months. Normal mammogram in April. Annual mammograms sufficient for monitoring due to low-risk cancer. CT scans unnecessary. - Continue letrozole  therapy. - Ensure adequate refills of letrozole . - Continue annual mammograms.  Osteoporosis screening Osteoporosis screening due. Previous bone density results excellent. Bone density test scheduled for Friday. - Perform bone density test on Friday.  No orders of the defined  types were placed in this encounter.  The patient has a good understanding of the overall plan. she agrees with it. she will call with any problems that may develop before the next visit here. Total time spent: 30 mins including face to face time and time spent for planning, charting and co-ordination of care   Naomi MARLA Chad, MD 01/08/24

## 2024-01-08 NOTE — Assessment & Plan Note (Signed)
 08/11/2021:Left lumpectomy: Grade 2 IDC 2 cm with intermediate grade DCIS, superior margin close of 0.1 cm, DCIS focally involves posterior and inferior margins ER 50%, PR 50%, HER2 negative, Ki-67 1% margin reexcision 10/07/2021: Benign   11/24/2021-12/15/2021: Adjuvant radiation   Treatment plan: Adjuvant antiestrogen therapy with letrozole  2.5 mg daily x5 years started August 2023 Letrozole  toxicities:  Breast cancer surveillance: Mammogram at [facility] 09/07/2023: Benign breast density category C Breast exam 01/08/2024: Benign

## 2024-01-09 ENCOUNTER — Ambulatory Visit: Admitting: Emergency Medicine

## 2024-01-11 NOTE — Progress Notes (Addendum)
 [facility]   Primary Care: Claudene Pellet, MD Primary Cardiologist: Lonni Cash, MD HF Cardiologist: Dr. Cherrie  HPI: Tara Hardin is an 83 y.o. female with history of persistent AF, breast cancer, aortic stenosis. LBBB andHFrEF.   echo 12/23 EF 50%. Mild MR. Mild to moderate TR. Moderate to severe AS with mean gradient 28 mmHg, AVA 0.94 cm2, SVI 47, DI 0.25.  Plan was to follow her moderate AS with repeat echo in one year.   Developed AF in 6/24 and was started on Eliquis . Developed HF symptoms 02/2023. Echo 04/05/23 EF < 20%. Severe enlargement of the left atrium. Mild to moderate MR. Moderately severe aortic stenosis with mean gradient 29 mmHg, AVA 0.82-0.88cm2, SVI 42, DI 0.32.   She saw Dr. Cash on 04/17/23 and volume status looked good on lasix  20 daily, but BP too low to start GDMT. She was referred to AHF clinic for further evaluation.  Seen in the AHF Clinic 05/10/23 to establish care. She was severely volume overloaded. Given overload with low BP and fragility, decision was made to admit for IV diuretics. She was diuresed and started on milrinone . She underwent R/LHC which showed mild coronary disease (30% Cx stenosis), elevated right and left filling pressures, pulmonary venous hypertension and good CO on milrinone  0.25. For her AF, she underwent TEE guided DCCV to NSR. She was maintained of low dose amiodarone , digoxin  stopped. TEE confirmed low flow/low gradient aortic stenosis and moderate mitral regurgitation. GDMT was titrated and she was discharged to ALF, weight 104 lbs.  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   Admitted 7/25 with near syncope 2/2 2nd degree AVB. EP admitted and she is now s/p CRT-P.   Today she returns for AHF follow up. Overall feeling pretty good. Denies palpitations, CP, dizziness, edema, or PND/Orthopnea. Denies SOB. Appetite ok. No fever or chills. Does not weight at home. Taking all medications. Denies ETOH, tobacco or drug use. Drinks  ~48 oz fluid a day. SBP 130s at home.   Family history: Brother passed away, had a fib. Sister has an ICD. Sister passed away from CAD.   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 Susy and Dr. Lucas.   Severe aortic stenosis     Current Outpatient Medications  Medication Sig Dispense Refill   acetaminophen  (TYLENOL ) 325 MG tablet Take 1-2 tablets (325-650 mg total) by mouth every 4 (four) hours as needed for mild pain (pain score 1-3).     amiodarone  (PACERONE ) 200 MG tablet Take 1 tablet (200 mg total) by mouth daily. (Patient taking differently: Take 200 mg by mouth in the morning.) 90 tablet 3   apixaban  (ELIQUIS ) 2.5 MG TABS tablet Take 2.5 mg by mouth 2 (two) times daily.     bismuth  subsalicylate (PEPTO BISMOL) 262 MG/15ML suspension Take 30 mLs by mouth every 6 (six) hours as needed for indigestion or diarrhea or loose stools.     CALCIUM PO Take 1 tablet by mouth daily.     letrozole  (FEMARA ) 2.5 MG tablet Take 1 tablet (2.5 mg total) by  mouth daily. (Patient taking differently: Take 2.5 mg by mouth in the morning.) 90 tablet 3   Multiple Vitamin (MULTIVITAMIN PO) Take 1 tablet by mouth in the morning.     Omega-3 Fatty Acids (FISH OIL PO) Take 1 tablet by mouth every other day. Alternate between Fish Oil and Red Yeast Rice Tablets     Red Yeast Rice Extract (RED YEAST RICE PO) Take 1 tablet by mouth every other day. Alternate between Red Yeast Rice Tablets and Fish Oil.     sodium chloride  (OCEAN) 0.65 % nasal spray Place 1 spray into the nose every 8 (eight) hours as needed (Dry  nasal passages).     furosemide  (LASIX ) 20 MG tablet Take 1 tablet (20 mg total) by mouth daily as needed (FOR SWELLING, OR WEIGHT GAIN OF 3 POUNDS OVERNIGHT OR 5 POUNDS IN 1 WEEK). 30 tablet 5   spironolactone  (ALDACTONE ) 25 MG tablet Take 1 tablet (25 mg total) by mouth daily. 30 tablet 5   No current facility-administered medications for this encounter.    No Known Allergies    Social History   Socioeconomic History   Marital status: Married    Spouse name: Not on file   Number of children: 3   Years of education: Not on file   Highest education level: Not on file  Occupational History   Occupation: Retired Information systems manager  Tobacco Use   Smoking status: Never   Smokeless tobacco: Never   Tobacco comments:    Never smoked 06/20/23  Vaping Use   Vaping status: Never Used  Substance and Sexual Activity   Alcohol use: Not Currently   Drug use: Never   Sexual activity: Not Currently    Birth control/protection: Post-menopausal  Other Topics Concern   Not on file  Social History Narrative   Not on file   Social Drivers of Health   Financial Resource Strain: Not on file  Food Insecurity: Patient Declined (09/12/2023)   Hunger Vital Sign    Worried About Running Out of Food in the Last Year: Patient declined    Ran Out of Food in the Last Year: Patient declined  Transportation Needs: No Transportation Needs (12/04/2023)   PRAPARE - Administrator, Civil Service (Medical): No    Lack of Transportation (Non-Medical): No  Physical Activity: Not on file  Stress: Not on file  Social Connections: Moderately Integrated (12/04/2023)   Social Connection and Isolation Panel    Frequency of Communication with Friends and Family: More than three times a week    Frequency of Social Gatherings with Friends and Family: Three times a week    Attends Religious Services: More than 4 times per year    Active Member of Clubs or Organizations: Yes    Attends Banker  Meetings: More than 4 times per year    Marital Status: Widowed  Intimate Partner Violence: Not At Risk (12/04/2023)   Humiliation, Afraid, Rape, and Kick questionnaire    Fear of Current or Ex-Partner: No    Emotionally Abused: No    Physically Abused: No    Sexually Abused: No   Family History  Problem Relation Age of Onset   Lung cancer Mother        d. 53   Heart attack Sister    Hypertension Sister    Atrial fibrillation Brother    Hypertension Brother    Heart attack Brother    Irregular heart beat Brother    Breast  cancer Paternal Aunt        dx after 7   Breast cancer Cousin        paternal female cousin   BP (!) 142/62   Pulse 75   Ht 5' 4 (1.626 m)   Wt 46.3 kg (102 lb)   SpO2 98%   BMI 17.51 kg/m   Wt Readings from Last 3 Encounters:  01/21/24 46.3 kg (102 lb)  01/08/24 46.1 kg (101 lb 9.6 oz)  12/04/23 46.8 kg (103 lb 2.8 oz)    PHYSICAL EXAM: General:  elderly/frail appearing.  No respiratory difficulty. Arrived with walker.  Neck: JVD flat.  Cor: Regular rate & rhythm. No murmurs. Lungs: clear Extremities: no edema  Neuro: alert & oriented x 3. Affect pleasant.   St Jude device interrogation: >99% VP, AT/AF burden 3.7%   ASSESSMENT & PLAN: 1. Chronic systolic CHF:  - Echo in 12/23 with EF 50%, moderate-severe AS.   - Developed atrial fibrillation in 6/24 but rate has been controlled.  She has LBBB - Echo 11/24 shows EF 20-25% with septal-lateral dyssynchrony consistent with LBBB, mild RV dysfunction, possible low flow/low gradient severe AS, mild to mod MR - Cause of drop in EF is uncertain.  AS does not appear critical but think severe (see below).  No significant CAD on cath, => NICM - Echo 09/12/23- LVEF 25-30%  - Echo 7/25: EF 35-30%, GIIDD, normal RV, normal functioning TAVR. AV mean gradient 8.8 mmHg - Plan to check BMET/BNP today. If renal function down trending will start Losartan  12.5 mg daily.  - Change Lasix  20 mg daily>PRN. - Increase  spironolactone  12.5>25 mg daily. Repeat BMET in 1 week.  - Hold off on SGLT2i with frailty. - No beta blocker yet - Off dig with bradycardia/AVB  2. Atrial fibrillation: - Persistent since at least 6/24.  - She has a severely dilated LA, doubt she will hold NSR without anti-arrhythmic.  - s/p TEE-guided DCCV 12/24 to NSR - Per EP she is not a candidate for CRT-D with frailty, LV dilation and body mass.  - Now s/p CRT-P for AVB.  - Off digoxin  with bradycardia - Continue amiodarone  200 mg daily. - Continue Eliquis  2.5 mg bid. Denies abnormal bleeding.   3. Aortic stenosis:  - Suspected low flow/low gradient severe AS with mean gradient 34 mmHg and AVA 0.83.  TEE confirmed low flow/low gradient severe AS.  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 Mitral regurgitation:  - Moderate on TEE 12/24 - mild on echo 7/25 - HF optimization as above  5. PVCs: - She denies palp  - Continue amiodarone   6. CKD IIIb:  - baseline Scr 1.2-1.4 - BMET today.  Follow up in 4 months with Dr. Bensimhon.   Tara Hardin AGACNP-BC   01/21/24

## 2024-01-15 ENCOUNTER — Ambulatory Visit (INDEPENDENT_AMBULATORY_CARE_PROVIDER_SITE_OTHER)

## 2024-01-15 DIAGNOSIS — I5022 Chronic systolic (congestive) heart failure: Secondary | ICD-10-CM | POA: Diagnosis not present

## 2024-01-17 ENCOUNTER — Telehealth: Payer: Self-pay | Admitting: *Deleted

## 2024-01-17 LAB — CUP PACEART REMOTE DEVICE CHECK
Battery Remaining Longevity: 121 mo
Battery Remaining Percentage: 95.5 %
Battery Voltage: 3.02 V
Brady Statistic AP VP Percent: 34 %
Brady Statistic AP VS Percent: 1 %
Brady Statistic AS VP Percent: 66 %
Brady Statistic AS VS Percent: 1 %
Brady Statistic RA Percent Paced: 34 %
Brady Statistic RV Percent Paced: 99 %
Date Time Interrogation Session: 20250902020013
Implantable Lead Connection Status: 753985
Implantable Lead Connection Status: 753985
Implantable Lead Implant Date: 20250721
Implantable Lead Implant Date: 20250721
Implantable Lead Location: 753859
Implantable Lead Location: 753860
Implantable Pulse Generator Implant Date: 20250721
Lead Channel Impedance Value: 390 Ohm
Lead Channel Impedance Value: 410 Ohm
Lead Channel Pacing Threshold Amplitude: 0.5 V
Lead Channel Pacing Threshold Amplitude: 0.625 V
Lead Channel Pacing Threshold Pulse Width: 0.5 ms
Lead Channel Pacing Threshold Pulse Width: 0.5 ms
Lead Channel Sensing Intrinsic Amplitude: 10.4 mV
Lead Channel Sensing Intrinsic Amplitude: 2.1 mV
Lead Channel Setting Pacing Amplitude: 0.75 V
Lead Channel Setting Pacing Amplitude: 1.625
Lead Channel Setting Pacing Pulse Width: 0.5 ms
Lead Channel Setting Sensing Sensitivity: 2 mV
Pulse Gen Model: 2272
Pulse Gen Serial Number: 8294240

## 2024-01-17 NOTE — Telephone Encounter (Signed)
 RN placed call to pt with recent bone denisty result from [facility] showing T Score -2.3.  pt states she is currently taking Vitamin D and Calcium supplements.

## 2024-01-18 ENCOUNTER — Telehealth (HOSPITAL_COMMUNITY): Payer: Self-pay

## 2024-01-18 NOTE — Telephone Encounter (Signed)
 Called to confirm/remind patient of their appointment at the [facility] on 01/21/24 2:00.   Appointment:   [x] Confirmed  [] Left mess   [] No answer/No voice mail  [] VM Full/unable to leave message  [] Phone not in service  Patient reminded to bring all medications and/or complete list.  Confirmed patient has transportation. Gave directions, instructed to utilize valet parking.

## 2024-01-20 ENCOUNTER — Ambulatory Visit: Payer: Self-pay | Admitting: Cardiology

## 2024-01-21 ENCOUNTER — Encounter (HOSPITAL_COMMUNITY): Payer: Self-pay

## 2024-01-21 ENCOUNTER — Encounter: Payer: Self-pay | Admitting: Hematology and Oncology

## 2024-01-21 ENCOUNTER — Ambulatory Visit (HOSPITAL_COMMUNITY): Payer: Self-pay | Admitting: Internal Medicine

## 2024-01-21 ENCOUNTER — Ambulatory Visit (HOSPITAL_COMMUNITY)
Admission: RE | Admit: 2024-01-21 | Discharge: 2024-01-21 | Disposition: A | Discharge status: Home / Self Care | Source: Ambulatory Visit | Attending: Internal Medicine | Admitting: Internal Medicine

## 2024-01-21 VITALS — BP 142/62 | HR 75 | Ht 64.0 in | Wt 102.0 lb

## 2024-01-21 DIAGNOSIS — I447 Left bundle-branch block, unspecified: Secondary | ICD-10-CM | POA: Insufficient documentation

## 2024-01-21 DIAGNOSIS — Z952 Presence of prosthetic heart valve: Secondary | ICD-10-CM | POA: Diagnosis not present

## 2024-01-21 DIAGNOSIS — I5022 Chronic systolic (congestive) heart failure: Secondary | ICD-10-CM | POA: Diagnosis not present

## 2024-01-21 DIAGNOSIS — I350 Nonrheumatic aortic (valve) stenosis: Secondary | ICD-10-CM | POA: Insufficient documentation

## 2024-01-21 DIAGNOSIS — I493 Ventricular premature depolarization: Secondary | ICD-10-CM | POA: Diagnosis not present

## 2024-01-21 DIAGNOSIS — I340 Nonrheumatic mitral (valve) insufficiency: Secondary | ICD-10-CM | POA: Diagnosis not present

## 2024-01-21 DIAGNOSIS — Z7901 Long term (current) use of anticoagulants: Secondary | ICD-10-CM | POA: Diagnosis not present

## 2024-01-21 DIAGNOSIS — Z853 Personal history of malignant neoplasm of breast: Secondary | ICD-10-CM | POA: Diagnosis present

## 2024-01-21 DIAGNOSIS — I4819 Other persistent atrial fibrillation: Secondary | ICD-10-CM | POA: Diagnosis present

## 2024-01-21 DIAGNOSIS — N1832 Chronic kidney disease, stage 3b: Secondary | ICD-10-CM | POA: Diagnosis not present

## 2024-01-21 DIAGNOSIS — N183 Chronic kidney disease, stage 3 unspecified: Secondary | ICD-10-CM

## 2024-01-21 LAB — BASIC METABOLIC PANEL WITH GFR
Anion gap: 11 (ref 5–15)
BUN: 28 mg/dL — ABNORMAL HIGH (ref 8–23)
CO2: 26 mmol/L (ref 22–32)
Calcium: 8.9 mg/dL (ref 8.9–10.3)
Chloride: 103 mmol/L (ref 98–111)
Creatinine, Ser: 1.5 mg/dL — ABNORMAL HIGH (ref 0.44–1.00)
GFR, Estimated: 35 mL/min — ABNORMAL LOW (ref >60)
Glucose, Bld: 119 mg/dL — ABNORMAL HIGH (ref 70–99)
Potassium: 4.5 mmol/L (ref 3.5–5.1)
Sodium: 140 mmol/L (ref 135–145)

## 2024-01-21 LAB — BRAIN NATRIURETIC PEPTIDE: B Natriuretic Peptide: 525.2 pg/mL — ABNORMAL HIGH (ref 0.0–100.0)

## 2024-01-21 MED ORDER — SPIRONOLACTONE 25 MG PO TABS: 25.0000 mg | ORAL_TABLET | Freq: Every day | ORAL | 5 refills | Status: AC

## 2024-01-21 MED ORDER — FUROSEMIDE 20 MG PO TABS: 20.0000 mg | ORAL_TABLET | Freq: Every day | ORAL | 5 refills | Status: AC | PRN

## 2024-01-21 NOTE — Patient Instructions (Addendum)
 Medication Changes:  INCREASE SPIRONOLACTONE  TO 25MG  ONCE DAILY   CHANGE LASIX  (FUROSEMIDE ) TO 20MG  ONCE DAILY ONLY AS NEEDED FOR SWELLING OR WEIGHT GAIN OF 3 POUNDS OVERNIGHT OR 5 POUNDS IN ONE WEEK  Lab Work:  Labs done today, your results will be available in MyChart, we will contact you for abnormal readings.  Follow-Up in: 4 MONTHS WITH DR. CHERRIE PLEASE CALL OUR OFFICE AROUND NOVEMBER TO GET SCHEDULED FOR YOUR JANUARY  APPOINTMENT. PHONE NUMBER IS [phone] OPTION 2   At the [facility], you and your health needs are our priority. We have a designated team specialized in the treatment of Heart Failure. This Care Team includes your primary Heart Failure Specialized Cardiologist (physician), Advanced Practice Providers (APPs- Physician Assistants and Nurse Practitioners), and Pharmacist who all work together to provide you with the care you need, when you need it.   You may see any of the following providers on your designated Care Team at your next follow up:  Dr. Toribio CHERRIE Dr. Ezra Shuck Dr. Ria Commander Dr. Odis Brownie Greig Mosses, NP Caffie Shed, [place] [facility] [place], [place] Beckey Coe, NP [place] Lee, NP Tinnie Redman, PharmD   Please be sure to bring in all your medications bottles to every appointment.   Need to Contact Us :  If you have any questions or concerns before your next appointment please send us  a message through [place] or call our office at [phone].    TO LEAVE A MESSAGE FOR THE NURSE SELECT OPTION 2, PLEASE LEAVE A MESSAGE INCLUDING: YOUR NAME DATE OF BIRTH CALL BACK NUMBER REASON FOR CALL**this is important as we prioritize the call backs  YOU WILL RECEIVE A CALL BACK THE SAME DAY AS LONG AS YOU CALL BEFORE 4:00 PM

## 2024-01-21 NOTE — Addendum Note (Signed)
 Encounter addended by: Hayes Beckey CROME, NP on: 01/21/2024 3:24 PM  Actions taken: Clinical Note Signed

## 2024-01-22 MED ORDER — LOSARTAN POTASSIUM 25 MG PO TABS
12.5000 mg | ORAL_TABLET | Freq: Every day | ORAL | 3 refills | Status: DC
Start: 2024-01-22 — End: 2024-03-24

## 2024-01-22 NOTE — Progress Notes (Signed)
 Remote PPM Transmission

## 2024-01-30 ENCOUNTER — Telehealth: Payer: Self-pay | Admitting: Cardiology

## 2024-01-30 ENCOUNTER — Ambulatory Visit: Admitting: Emergency Medicine

## 2024-01-30 NOTE — Telephone Encounter (Signed)
 Pt is inquiring about getting cataract surgery and would like Dr. Shaune advice. Please advise.

## 2024-01-30 NOTE — Telephone Encounter (Signed)
 Patient was previously scheduled for cataract surgery but had to cancel due to being hospitalized. She had a PPM placed 7/21. She should like to know if Dr. Kennyth is okay with her rescheduling her cataract surgery at this time or if she should post-pone. Advised that message was sent to him. Advised that if her surgeon is needing cardiac clearance they will need to fax over a request. Patient verbalized understanding.

## 2024-01-31 NOTE — Telephone Encounter (Signed)
 Advised patient that Dr. Kennyth is okay with her having cataract surgery.

## 2024-02-04 ENCOUNTER — Encounter: Payer: Self-pay | Admitting: Physician Assistant

## 2024-02-06 ENCOUNTER — Telehealth: Payer: Self-pay

## 2024-02-06 NOTE — Telephone Encounter (Signed)
   Pre-operative Risk Assessment    Patient Name: Tara Hardin  DOB: 1941-03-09 MRN: [id number]   Date of last office visit: 10/18/23 [name], PA-C Date of next office visit: 03/11/24 [name], MD   Request for Surgical Clearance    Procedure:  CATARACT SURGERY RIGHT EYE  Date of Surgery:  Clearance TBD                                Surgeon:  DR OCTAVIA Socks Group or Practice Name:  [facility] Phone number:  [phone] Fax number:  [phone]   Type of Clearance Requested:   - Medical  - Pharmacy:  Hold Apixaban  (Eliquis ) PER REQUEST MEDS DO NOT NEED TO BE DISCONTINUED FOR CATARACT SURGERY   Type of Anesthesia:  TOPICAL   Additional requests/questions:    Signed, Lucie DELENA Ku   02/06/2024, 8:54 AM

## 2024-02-06 NOTE — Telephone Encounter (Signed)
   Patient Name: Tara Hardin  DOB: 01/03/1941 MRN: [id number]  Primary Cardiologist: Lonni Cash, MD  Chart reviewed as part of pre-operative protocol coverage. Cataract extractions are recognized in guidelines as low risk surgeries that do not typically require specific preoperative testing or holding of blood thinner therapy. Therefore, given past medical history and time since last visit, based on ACC/AHA guidelines, Ronee Methot would be at acceptable risk for the planned procedure without further cardiovascular testing.   I will route this recommendation to the requesting party via Epic fax function and remove from pre-op pool.  Please call with questions.  Lamarr Satterfield, NP 02/06/2024, 9:05 AM

## 2024-02-13 NOTE — Telephone Encounter (Signed)
 2nd request received.  Will re fax over the preop clearance to the surgeons office

## 2024-03-11 ENCOUNTER — Encounter: Payer: Self-pay | Admitting: Cardiology

## 2024-03-11 ENCOUNTER — Ambulatory Visit: Attending: Cardiology | Admitting: Cardiology

## 2024-03-11 VITALS — BP 130/70 | HR 68 | Ht 64.0 in | Wt 106.4 lb

## 2024-03-11 DIAGNOSIS — Z79899 Other long term (current) drug therapy: Secondary | ICD-10-CM

## 2024-03-11 DIAGNOSIS — Z952 Presence of prosthetic heart valve: Secondary | ICD-10-CM

## 2024-03-11 DIAGNOSIS — I480 Paroxysmal atrial fibrillation: Secondary | ICD-10-CM | POA: Diagnosis not present

## 2024-03-11 DIAGNOSIS — I447 Left bundle-branch block, unspecified: Secondary | ICD-10-CM

## 2024-03-11 DIAGNOSIS — Z950 Presence of cardiac pacemaker: Secondary | ICD-10-CM | POA: Diagnosis not present

## 2024-03-11 DIAGNOSIS — D6869 Other thrombophilia: Secondary | ICD-10-CM

## 2024-03-11 DIAGNOSIS — I5022 Chronic systolic (congestive) heart failure: Secondary | ICD-10-CM | POA: Diagnosis not present

## 2024-03-11 LAB — CUP PACEART INCLINIC DEVICE CHECK
Battery Remaining Longevity: 118 mo
Battery Voltage: 3.01 V
Brady Statistic RA Percent Paced: 41 %
Brady Statistic RV Percent Paced: 99.94 %
Date Time Interrogation Session: 20251028165936
Implantable Lead Connection Status: 753985
Implantable Lead Connection Status: 753985
Implantable Lead Implant Date: 20250721
Implantable Lead Implant Date: 20250721
Implantable Lead Location: 753859
Implantable Lead Location: 753860
Implantable Pulse Generator Implant Date: 20250721
Lead Channel Impedance Value: 400 Ohm
Lead Channel Impedance Value: 425 Ohm
Lead Channel Pacing Threshold Amplitude: 0.625 V
Lead Channel Pacing Threshold Amplitude: 0.625 V
Lead Channel Pacing Threshold Pulse Width: 0.5 ms
Lead Channel Pacing Threshold Pulse Width: 0.5 ms
Lead Channel Sensing Intrinsic Amplitude: 1.6 mV
Lead Channel Sensing Intrinsic Amplitude: 12 mV
Lead Channel Setting Pacing Amplitude: 0.875
Lead Channel Setting Pacing Amplitude: 1.625
Lead Channel Setting Pacing Pulse Width: 0.5 ms
Lead Channel Setting Sensing Sensitivity: 2 mV
Pulse Gen Model: 2272
Pulse Gen Serial Number: 8294240

## 2024-03-11 NOTE — Progress Notes (Signed)
 Electrophysiology Office Note:   Date:  03/11/2024  ID:  Tara Hardin, DOB 10-23-1940, MRN [id number]  Primary Cardiologist: Lonni Cash, MD Electrophysiologist: [name], MD      History of Present Illness:   Tara Hardin is a 83 y.o. 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  Discussed the use of AI scribe software for clinical note transcription with the patient, who gave verbal consent to proceed.  History of Present Illness Tara Hardin is an 83 year old female with a history of pacemaker placement who presents for follow-up regarding her cardiac condition.  She has been stable since her hospital discharge following pacemaker placement, with no episodes of syncope. She consistently uses the pacemaker. Her history includes aortic valve replacement and stent placement.  She feels capable of increasing her physical activity, including walking on her street, although she sometimes experiences weakness or leg pain after excessive exercise. No shortness of breath or leg swelling is noted. She remains active in gardening, which she enjoys.  Her current medications include spironolactone , taken regularly, and Lasix , used as needed for fluid retention. She is also on amiodarone  to maintain normal heart rhythm. Her blood pressure has been stable, and she has not experienced fluid retention recently.  She has a history of high blood pressure, which she monitors closely, and she is  cautious about lifting heavy objects, adhering to a self-imposed limit of 25 pounds. 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   Review of systems complete and found to be negative unless listed in HPI.   EP Information / Studies Reviewed:    EKG is not ordered today. EKG from 12/04/23 reviewed which showed AS-VP       Echo 11/27/23:   1. Left ventricular ejection fraction, by estimation, is 25 to 30%. Left  ventricular ejection fraction by 3D volume is 30 %. The left ventricle has  severely decreased function. The left ventricle has no regional wall  motion abnormalities. Left  ventricular diastolic parameters are consistent with Grade II diastolic  dysfunction (pseudonormalization). The average left ventricular global  longitudinal strain is -11.9 %. The global longitudinal strain is  abnormal.   2. Right ventricular systolic function is normal. The right ventricular  size is normal.   3. Left atrial size was severely dilated.   4. The mitral valve is normal in structure. Mild mitral valve  regurgitation. No evidence of mitral stenosis.   5. The aortic valve has been repaired/replaced. Aortic valve  regurgitation is trivial. No aortic stenosis is present. There is a 23 mm  Sapien prosthetic (TAVR) valve present in the aortic position. Echo  findings are consistent with normal structure and  function of the aortic valve prosthesis. Aortic valve mean gradient  measures 8.8 mmHg.   6. The inferior vena cava is normal in size with greater than 50%  respiratory variability, suggesting right atrial pressure of 3 mmHg.       Physical Exam:   VS:  BP 130/70 (BP Location: Left Arm, Patient Position: Sitting, Cuff Size: Normal)  Pulse 68   Ht 5' 4 (1.626 m)   Wt 106 lb 6.4 oz (48.3 kg)   SpO2 98%   BMI 18.26 kg/m    Wt Readings from Last 3 Encounters:  03/11/24 106 lb 6.4 oz (48.3 kg)  01/21/24 102 lb (46.3 kg)  01/08/24 101 lb 9.6 oz (46.1  kg)     General: Well developed, in no acute distress.  Neck: No JVD.  Cardiac: Normal rate, regular rhythm. Well healed left chest pacer pocket. Resp: Normal work of breathing.  Ext: No edema.  Neuro: No gross focal deficits.  Psych: Normal affect.    ASSESSMENT AND PLAN:    # CHB s/p dual-chamber pacemaker with left bundle branch area pacing lead - In-clinic device interrogation performed today.  Appropriate device function stable lead parameters.  Battery okay.  Presenting rhythm AS-VP.  There was some far field oversensing on the atrial lead of the R wave.  This was causing AMS episodes.  We have adjusted the sensitivity in efforts to try and minimize far field are oversensing. -Continue remote monitoring.  # Paroxysmal atrial fibrillation: Given history of LV dysfunction, prioritize a rhythm control strategy.  She is on amiodarone . # High risk medication use: Amiodarone . # Hypercoagulable state due to atrial fibrillation: -Continue amiodarone  200 mg daily.  Likely reduced to 100 mg daily pending results of echocardiogram. - Continue Eliquis  2.5 mg twice daily.  # Chronic systolic heart failure: Well compensated on exam today.  No HF symptoms. # Left bundle branch block -She has selective left sided conduction system capture with her left bundle branch area pacing lead.  We will repeat an echocardiogram to see if LVEF improved with pacing. -Continue GDMT regimen and close follow-up with general cardiology.  # AS status post TAVR: Well compensated on exam today. -Continue follow-up with general cardiology.   Follow up with Dr. Kennyth in 3 months.  Will follow-up results of echocardiogram to see if LVEF improved with correction of left bundle.  If LVEF improved, then recommend decreasing amiodarone  to 100 mg once daily.  Signed, Fonda Kennyth, MD

## 2024-03-11 NOTE — Patient Instructions (Signed)
 Medication Instructions:  Your physician recommends that you continue on your current medications as directed. Please refer to the Current Medication list given to you today.  *If you need a refill on your cardiac medications before your next appointment, please call your pharmacy*  Testing/Procedures: Echocardiogram Your physician has requested that you have an echocardiogram. Echocardiography is a painless test that uses sound waves to create images of your heart. It provides your doctor with information about the size and shape of your heart and how well your heart's chambers and valves are working. This procedure takes approximately one hour. There are no restrictions for this procedure. Please do NOT wear cologne, perfume, aftershave, or lotions (deodorant is allowed). Please arrive 15 minutes prior to your appointment time.  Please note: We ask at that you not bring children with you during ultrasound (echo/ vascular) testing. Due to room size and safety concerns, children are not allowed in the ultrasound rooms during exams. Our front office staff cannot provide observation of children in our lobby area while testing is being conducted. An adult accompanying a patient to their appointment will only be allowed in the ultrasound room at the discretion of the ultrasound technician under special circumstances. We apologize for any inconvenience.  Follow-Up: At [facility], you and your health needs are our priority.  As part of our continuing mission to provide you with exceptional heart care, our providers are all part of one team.  This team includes your primary Cardiologist (physician) and Advanced Practice Providers or APPs (Physician Assistants and Nurse Practitioners) who all work together to provide you with the care you need, when you need it.  Your next appointment:   10 weeks  Provider:   [name], MD

## 2024-03-16 ENCOUNTER — Ambulatory Visit: Payer: Self-pay | Admitting: Cardiology

## 2024-03-17 ENCOUNTER — Telehealth: Payer: Self-pay | Admitting: Cardiology

## 2024-03-17 NOTE — Telephone Encounter (Signed)
 Spoke with the patient and advised to keep echocardiogram as scheduled for later this month. Patient verbalized understanding.

## 2024-03-17 NOTE — Telephone Encounter (Signed)
  Patient is calling to ask Dr. Kennyth if she needs to get an echo now or wait until April to have it done on the same day as her yearly follow-up with [name] for her TAVR follow-up. She currently made an appt for echo on 11/24 at [facility] [place]

## 2024-03-20 ENCOUNTER — Ambulatory Visit (HOSPITAL_COMMUNITY): Admitting: Physician Assistant

## 2024-03-20 ENCOUNTER — Other Ambulatory Visit: Payer: Self-pay | Admitting: Cardiology

## 2024-03-20 DIAGNOSIS — I447 Left bundle-branch block, unspecified: Secondary | ICD-10-CM

## 2024-03-20 DIAGNOSIS — Z79899 Other long term (current) drug therapy: Secondary | ICD-10-CM

## 2024-03-20 DIAGNOSIS — Z950 Presence of cardiac pacemaker: Secondary | ICD-10-CM

## 2024-03-20 DIAGNOSIS — I480 Paroxysmal atrial fibrillation: Secondary | ICD-10-CM

## 2024-03-20 DIAGNOSIS — Z952 Presence of prosthetic heart valve: Secondary | ICD-10-CM

## 2024-03-20 DIAGNOSIS — I5022 Chronic systolic (congestive) heart failure: Secondary | ICD-10-CM

## 2024-03-24 ENCOUNTER — Ambulatory Visit (HOSPITAL_COMMUNITY)
Admission: RE | Admit: 2024-03-24 | Discharge: 2024-03-24 | Disposition: A | Discharge status: Home / Self Care | Source: Ambulatory Visit | Attending: Physician Assistant | Admitting: Physician Assistant

## 2024-03-24 VITALS — BP 138/60 | HR 69 | Ht 64.0 in | Wt 106.4 lb

## 2024-03-24 DIAGNOSIS — I4891 Unspecified atrial fibrillation: Secondary | ICD-10-CM | POA: Diagnosis not present

## 2024-03-24 DIAGNOSIS — Z5181 Encounter for therapeutic drug level monitoring: Secondary | ICD-10-CM | POA: Diagnosis not present

## 2024-03-24 DIAGNOSIS — Z79899 Other long term (current) drug therapy: Secondary | ICD-10-CM

## 2024-03-24 DIAGNOSIS — D6869 Other thrombophilia: Secondary | ICD-10-CM | POA: Diagnosis not present

## 2024-03-24 DIAGNOSIS — I4819 Other persistent atrial fibrillation: Secondary | ICD-10-CM

## 2024-03-24 NOTE — Progress Notes (Addendum)
 Primary Care Physician: Claudene Pellet, MD Primary Cardiologist: Lonni Cash, MD Electrophysiologist: [name], MD  Referring Physician: ED   Tara Hardin is a 83 y.o. female with a history of HFrEF, LBBB, breast cancer, aortic stenosis, atrial fibrillation who presents for follow up in the [facility]. Patient is on Eliquis  for stroke prevention. Her diagnosis of atrial fibrillation dates to June 2024. At that time she was started on Eliquis . Her EF shortly after diagnosis showed an ejection fraction of less than 20% with severe left atrial enlargement and severe aortic stenosis. She presented to heart failure clinic on May 10 2023 markedly volume overloaded. She was admitted to the hospital for IV diuresis. She was started on amiodarone . She was started on milrinone  and diuresed. She underwent a TEE/DCCV on May 15 2023. She was bradycardic post DCCV and EP was consulted. Her digoxin  was discontinued. 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 She was admitted 12/02/23 with syncope and 2nd degree AV block, s/p PPM implant.  Patient returns for follow up for atrial fibrillation and amiodarone  monitoring. She reports that she has felt well since her last visit. Her last device interrogation showed 8% afib burden but this was felt to be due to over sensing, adjustments made at her visit on 10/28. No bleeding issues on anticoagulation.   Today, she  denies symptoms of palpitations, chest pain, shortness of breath, orthopnea, PND, lower extremity edema, dizziness, presyncope, syncope, snoring, daytime somnolence, bleeding, or neurologic sequela. The patient is tolerating medications without difficulties and is otherwise without complaint today.    Atrial Fibrillation Risk Factors:  she does not have symptoms or diagnosis of sleep apnea. she does not have a history of rheumatic fever.   Atrial Fibrillation Management history:  Previous antiarrhythmic drugs:  amiodarone   Previous cardioversions: 05/15/23 Previous ablations: none Anticoagulation history: Eliquis   ROS- All systems are reviewed and negative except as per the HPI above.  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 Susy and Dr. Lucas.   Severe aortic stenosis     Current Outpatient Medications  Medication Sig Dispense Refill   acetaminophen  (TYLENOL ) 325 MG tablet Take 1-2 tablets (325-650 mg total) by mouth every 4 (four) hours as needed for mild pain (pain score 1-3). (Patient taking differently: Take 325-650 mg by mouth as needed for mild pain (pain score 1-3).)     amiodarone  (PACERONE ) 200 MG tablet Take 1 tablet (200 mg total) by mouth daily. 90 tablet 3   apixaban  (ELIQUIS ) 2.5 MG TABS tablet Take 2.5 mg by mouth 2 (two) times daily.     bismuth  subsalicylate (PEPTO BISMOL) 262 MG/15ML suspension Take 30 mLs by mouth every 6 (six) hours as needed for indigestion or diarrhea or loose stools. (Patient taking differently: Take 30 mLs by mouth as needed for indigestion or diarrhea or loose stools.)     CALCIUM PO Take 1 tablet by mouth daily.     furosemide  (LASIX ) 20 MG tablet Take 1 tablet (20 mg total) by mouth daily as needed (FOR SWELLING, OR WEIGHT GAIN  OF 3 POUNDS OVERNIGHT OR 5 POUNDS IN 1 WEEK). 30 tablet 5   letrozole  (FEMARA ) 2.5 MG tablet Take 1 tablet (2.5 mg total) by mouth daily. 90 tablet 3   Multiple Vitamin (MULTIVITAMIN PO) Take 1 tablet by mouth in the morning.     Multiple Vitamins-Minerals (HAIR SKIN AND NAILS FORMULA PO) Take 1 capsule by mouth 3 (three) times a  week.     Omega-3 Fatty Acids (FISH OIL PO) Take 1 tablet by mouth every other day. Alternate between Fish Oil and Red Yeast Rice Tablets     Red Yeast Rice Extract (RED YEAST RICE PO) Take 1 tablet by mouth every other day. Alternate between Red Yeast Rice Tablets and Fish Oil.     sodium chloride  (OCEAN) 0.65 % nasal spray Place 1 spray into the nose every 8 (eight) hours as needed (Dry nasal passages).     spironolactone  (ALDACTONE ) 25 MG tablet Take 1 tablet (25 mg total) by mouth daily. 30 tablet 5   No current facility-administered medications for this encounter.    Physical Exam: BP 138/60   Pulse 69   Ht 5' 4 (1.626 m)   Wt 48.3 kg   BMI 18.26 kg/m   GEN: Well nourished, well developed in no acute distress CARDIAC: Regular rate and rhythm, no rubs, gallops, 2/6 systolic murmur RESPIRATORY:  Clear to auscultation without rales, wheezing or rhonchi  ABDOMEN: Soft, non-tender, non-distended EXTREMITIES:  No edema; No deformity    Wt Readings from Last 3 Encounters:  03/24/24 48.3 kg  03/11/24 48.3 kg  01/21/24 46.3 kg     EKG today demonstrates A sense V paced rhythm Vent. rate 69 BPM PR interval 208 ms QRS duration 156 ms QT/QTcB 460/492 ms   Echo 11/27/23 demonstrated   1. Left ventricular ejection fraction, by estimation, is 25 to 30%. Left  ventricular ejection fraction by 3D volume is 30 %. The left ventricle has  severely decreased function. The left ventricle has no regional wall  motion abnormalities. Left ventricular diastolic parameters are consistent with Grade II diastolic dysfunction (pseudonormalization). The average left ventricular global longitudinal strain is -11.9 %. The global longitudinal strain is  abnormal.   2. Right ventricular systolic function is normal. The right ventricular  size is normal.   3. Left atrial size was severely dilated.   4. The mitral valve is normal in structure. Mild mitral valve  regurgitation. No evidence of mitral  stenosis.   5. The aortic valve has been repaired/replaced. Aortic valve  regurgitation is trivial. No aortic stenosis is present. There is a 23 mm  Sapien prosthetic (TAVR) valve present in the aortic position. Echo  findings are consistent with normal structure and  function of the aortic valve prosthesis. Aortic valve mean gradient  measures 8.8 mmHg.   6. The inferior vena cava is normal in size with greater than 50%  respiratory variability, suggesting right atrial pressure of 3 mmHg.   Comparison(s): Echocardiogram done 09/12/23 showed an EF of 25-30% with an AV Mean Grad of 10 mmHg.    CHA2DS2-VASc Score = 4  The patient's score is based upon: CHF History: 1 HTN History: 0 Diabetes History: 0 Stroke History: 0 Vascular Disease History: 0 Age Score: 2 Gender Score: 1       ASSESSMENT AND PLAN: Persistent Atrial Fibrillation (ICD10:  I48.19) The patient's CHA2DS2-VASc score is 4, indicating a 4.8% annual risk of stroke.   Patient appears to be maintaining SR Continue amiodarone  200  mg daily. Can consider decreasing to 100 mg daily if she continues to maintain SR. Continue Eliquis  2.5 mg BID  Secondary Hypercoagulable State (ICD10:  D68.69) The patient is at significant risk for stroke/thromboembolism based upon her CHA2DS2-VASc Score of 4.  Continue Apixaban  (Eliquis ).   High Risk Medication Monitoring (ICD 10: J342684) Patient requires ongoing monitoring for anti-arrhythmic medication which has the potential to cause life threatening arrhythmias. Intervals on ECG acceptable for amiodarone  monitoring. Check cmet/TSH today.     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      Daril Kicks PA-C Afib Clinic [facility] [address] [place], [place] [address] [phone]

## 2024-03-25 ENCOUNTER — Ambulatory Visit (HOSPITAL_COMMUNITY): Payer: Self-pay | Admitting: Physician Assistant

## 2024-03-25 LAB — COMPREHENSIVE METABOLIC PANEL WITH GFR
ALT: 27 IU/L (ref 0–32)
AST: 33 IU/L (ref 0–40)
Albumin: 4.3 g/dL (ref 3.7–4.7)
Alkaline Phosphatase: 111 IU/L (ref 48–129)
BUN/Creatinine Ratio: 22 (ref 12–28)
BUN: 27 mg/dL (ref 8–27)
Bilirubin Total: 0.3 mg/dL (ref 0.0–1.2)
CO2: 24 mmol/L (ref 20–29)
Calcium: 8.9 mg/dL (ref 8.7–10.3)
Chloride: 103 mmol/L (ref 96–106)
Creatinine, Ser: 1.24 mg/dL — ABNORMAL HIGH (ref 0.57–1.00)
Globulin, Total: 2.3 g/dL (ref 1.5–4.5)
Glucose: 93 mg/dL (ref 70–99)
Potassium: 4.7 mmol/L (ref 3.5–5.2)
Sodium: 139 mmol/L (ref 134–144)
Total Protein: 6.6 g/dL (ref 6.0–8.5)
eGFR: 43 mL/min/1.73 — ABNORMAL LOW (ref >59)

## 2024-03-25 LAB — TSH: TSH: 4.63 u[IU]/mL — ABNORMAL HIGH (ref 0.450–4.500)

## 2024-04-02 ENCOUNTER — Other Ambulatory Visit: Payer: Self-pay | Admitting: Physician Assistant

## 2024-04-02 ENCOUNTER — Ambulatory Visit: Admitting: Emergency Medicine

## 2024-04-02 ENCOUNTER — Encounter: Payer: Self-pay | Admitting: Emergency Medicine

## 2024-04-02 VITALS — BP 130/58 | HR 70 | Temp 97.3°F | Ht 64.0 in | Wt 106.2 lb

## 2024-04-02 DIAGNOSIS — R911 Solitary pulmonary nodule: Secondary | ICD-10-CM | POA: Diagnosis not present

## 2024-04-02 DIAGNOSIS — Z952 Presence of prosthetic heart valve: Secondary | ICD-10-CM

## 2024-04-02 NOTE — Progress Notes (Signed)
 Subjective:    Patient ID: Tara Hardin, female    DOB: 11/26/1940, 83 y.o.   MRN: [id number]  HPI Discussed the use of AI scribe software for clinical note transcription with the patient, who gave verbal consent to proceed.  History of Present Illness Tara Hardin is an 83 year old female with a history of breast cancer who presents for evaluation of a pulmonary nodule.  A CT scan of the chest and abdomen performed in March 2025 revealed an 8 mm anterior left upper lobe nodule with some pleural stranding. This finding was new to her, and she has no history of being informed about a lung nodule previously. 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 Her mother, who was a smoker, was diagnosed with lung cancer  She has a history of breast cancer, for which she underwent two procedures in March 2023 and 2020. The initial surgery did not remove all of the cancer, necessitating a second procedure. She also received radiation therapy and has been on a medication regimen for five years, with two years remaining.  Her cardiac history includes aortic valve replacement via TAVR due to severe aortic stenosis, atrial fibrillation, and heart failure with reduced ejection fraction. She has undergone cardioversion in the past and subsequently developed second-degree AV block, leading to pacemaker placement. She is currently on amiodarone .  She also has mitral regurgitation and prediabetes.  CT scan of the chest done on 08/09/2023 reviewed by me showed an 8 mm anterior left upper lobe pulmonary nodule with some pleural stranding.  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 Susy and Dr. Lucas.   Severe aortic stenosis     Family History  Problem Relation Age of Onset   Lung cancer Mother        d. 24   Heart attack Sister    Hypertension Sister    Atrial fibrillation Brother    Hypertension Brother    Heart attack Brother    Irregular heart beat Brother    Breast cancer Paternal Aunt        dx after 38   Breast cancer Cousin        paternal female cousin     Social History   Socioeconomic History   Marital status: Married    Spouse name: Not on file   Number of children: 3   Years of education: Not on file   Highest education level: Not on file  Occupational History   Occupation: Retired Information Systems Manager  Tobacco Use   Smoking status: Never   Smokeless tobacco: Never   Tobacco comments:    Never smoked 06/20/23  Vaping Use   Vaping status: Never Used  Substance and Sexual Activity   Alcohol use: Not Currently   Drug use: Never   Sexual activity: Not Currently  Birth control/protection: Post-menopausal  Other Topics Concern   Not on file  Social History Narrative   Not on file   Social Drivers of Health   Financial Resource Strain: Not on file  Food Insecurity: Patient Declined (09/12/2023)   Hunger Vital Sign    Worried About Running Out of Food in the Last Year: Patient declined    Ran Out of Food in the Last Year: Patient declined  Transportation Needs: No Transportation Needs (12/04/2023)   PRAPARE - Administrator, Civil Service (Medical): No    Lack of Transportation (Non-Medical): No  Physical Activity: Not on file  Stress: Not on file  Social Connections: Moderately Integrated (12/04/2023)   Social Connection and Isolation Panel     Frequency of Communication with Friends and Family: More than three times a week    Frequency of Social Gatherings with Friends and Family: Three times a week    Attends Religious Services: More than 4 times per year    Active Member of Clubs or Organizations: Yes    Attends Banker Meetings: More than 4 times per year    Marital Status: Widowed  Intimate Partner Violence: Not At Risk (12/04/2023)   Humiliation, Afraid, Rape, and Kick questionnaire    Fear of Current or Ex-Partner: No    Emotionally Abused: No    Physically Abused: No    Sexually Abused: No    No Known Allergies  Current Outpatient Medications on File Prior to Visit  Medication Sig Dispense Refill   acetaminophen  (TYLENOL ) 325 MG tablet Take 1-2 tablets (325-650 mg total) by mouth every 4 (four) hours as needed for mild pain (pain score 1-3). (Patient taking differently: Take 325-650 mg by mouth as needed for mild pain (pain score 1-3).)     amiodarone  (PACERONE ) 200 MG tablet Take 1 tablet (200 mg total) by mouth daily. 90 tablet 3   apixaban  (ELIQUIS ) 2.5 MG TABS tablet Take 2.5 mg by mouth 2 (two) times daily.     bismuth  subsalicylate (PEPTO BISMOL) 262 MG/15ML suspension Take 30 mLs by mouth every 6 (six) hours as needed for indigestion or diarrhea or loose stools.     CALCIUM PO Take 1 tablet by mouth daily.     letrozole  (FEMARA ) 2.5 MG tablet Take 1 tablet (2.5 mg total) by mouth daily. 90 tablet 3   Multiple Vitamin (MULTIVITAMIN PO) Take 1 tablet by mouth in the morning.     Multiple Vitamins-Minerals (HAIR SKIN AND NAILS FORMULA PO) Take 1 capsule by mouth 3 (three) times a week.     Omega-3 Fatty Acids (FISH OIL PO) Take 1 tablet by mouth every other day. Alternate between Fish Oil and Red Yeast Rice Tablets     Red Yeast Rice Extract (RED YEAST RICE PO) Take 1 tablet by mouth every other day. Alternate between Red Yeast Rice Tablets and Fish Oil.     sodium chloride  (OCEAN) 0.65 % nasal spray  Place 1 spray into the nose every 8 (eight) hours as needed (Dry nasal passages).     spironolactone  (ALDACTONE ) 25 MG tablet Take 1 tablet (25 mg total) by mouth daily. 30 tablet 5   furosemide  (LASIX ) 20 MG tablet Take 1 tablet (20 mg total) by mouth daily as needed (FOR SWELLING, OR WEIGHT GAIN OF 3 POUNDS OVERNIGHT OR 5 POUNDS IN 1 WEEK). (Patient not taking: Reported on 04/02/2024) 30 tablet 5   No current facility-administered medications on file prior to visit.  Objective:    Vitals:   04/02/24 1559  BP: (!) 130/58  Pulse: 70  Temp: (!) 97.3 F (36.3 C)  SpO2: 98%  Weight: 106 lb 3.2 oz (48.2 kg)  Height: 5' 4 (1.626 m)   Physical Exam Gen: Pleasant, very thin woman, in no distress,  normal affect  ENT: No lesions,  mouth clear,  oropharynx clear, no postnasal drip  Neck: No JVD, no stridor  Lungs: No use of accessory muscles, distant, no wheezes or crackles  Cardiovascular: RRR, 2/6 systolic murmur, no edema  Musculoskeletal: No deformities, no cyanosis or clubbing  Neuro: alert, awake, non focal  Skin: Warm, no lesions or rash       Assessment & Plan:   Assessment & Plan Pulmonary nodule   Assessment and Plan Assessment & Plan Left upper lobe pulmonary nodule 8 mm anterior left upper lobe nodule with pleural stranding identified. Differential includes benign causes; early lung cancer less likely. No prior nodules or significant smoking history. Family history of lung cancer noted. Significance uncertain without further imaging. - Ordered repeat CT scan for comparison with March 2025 scan. - [facility] approval for CT scan. - Scheduled CT scan at [facility]. - Monitor nodule for changes; if stable, consider no further action after a couple of years. - If there is an interval change or significant suspicion for malignancy then we will need to talk about the pros and cons of possible biopsy versus referral for empiric SBRT if we  believe her procedural risk precludes a biopsy.  I do not believe she would be a surgical candidate    Return for With Dr. Shelah, To review CT scan of the chest.   Lamar Shelah, MD, PhD 04/02/2024, 4:26 PM [facility] [phone] or if no answer before 7:00PM call [phone] For any issues after 7:00PM please call eLink [phone]

## 2024-04-02 NOTE — Patient Instructions (Signed)
  VISIT SUMMARY: Today, we discussed the findings of your recent CT scan, which showed a new 8 mm nodule in the left upper lobe of your lung. We reviewed your medical history, including your past breast cancer treatments and heart conditions. We also talked about the next steps for monitoring the lung nodule.  YOUR PLAN: -LEFT UPPER LOBE PULMONARY NODULE: A pulmonary nodule is a small, round growth in the lung. Your CT scan showed an 8 mm nodule in the left upper lobe of your lung. While it could be benign, we need to monitor it to rule out early lung cancer. We have ordered a repeat CT scan to compare with your previous scan from March 2025. The scan is scheduled at [facility], and we have coordinated with your insurance for approval. If the nodule remains stable over time, we may not need to take further action after a couple of years.  INSTRUCTIONS: Please attend the scheduled CT scan at [facility]. We will review the results to determine the next steps. If you have any questions or concerns, feel free to contact our office.

## 2024-04-03 ENCOUNTER — Other Ambulatory Visit: Payer: Self-pay | Admitting: Cardiovascular Disease

## 2024-04-04 MED ORDER — AMIODARONE HCL 200 MG PO TABS: 200.0000 mg | ORAL_TABLET | Freq: Every day | ORAL | 2 refills | Status: AC

## 2024-04-07 ENCOUNTER — Ambulatory Visit (HOSPITAL_BASED_OUTPATIENT_CLINIC_OR_DEPARTMENT_OTHER)

## 2024-04-07 ENCOUNTER — Ambulatory Visit (HOSPITAL_BASED_OUTPATIENT_CLINIC_OR_DEPARTMENT_OTHER)
Admission: RE | Admit: 2024-04-07 | Discharge: 2024-04-07 | Disposition: A | Discharge status: Home / Self Care | Source: Ambulatory Visit | Attending: Cardiology | Admitting: Cardiology

## 2024-04-07 ENCOUNTER — Other Ambulatory Visit: Payer: Self-pay | Admitting: Cardiology

## 2024-04-07 DIAGNOSIS — I5022 Chronic systolic (congestive) heart failure: Secondary | ICD-10-CM | POA: Insufficient documentation

## 2024-04-07 DIAGNOSIS — I480 Paroxysmal atrial fibrillation: Secondary | ICD-10-CM | POA: Diagnosis present

## 2024-04-07 LAB — ECHOCARDIOGRAM COMPLETE
AR max vel: 0.56 cm2
AV Area VTI: 0.63 cm2
AV Area mean vel: 0.59 cm2
AV Mean grad: 12 mmHg
AV Peak grad: 21.6 mmHg
AV Vena cont: 0.3 cm
Ao pk vel: 2.33 m/s
Area-P 1/2: 4.57 cm2
Calc EF: 39.6 %
Est EF: 35
MV M vel: 4.97 m/s
MV Peak grad: 98.8 mmHg
S' Lateral: 4.3 cm
Single Plane A2C EF: 39.1 %
Single Plane A4C EF: 40.2 %

## 2024-04-15 ENCOUNTER — Ambulatory Visit

## 2024-04-15 DIAGNOSIS — I5022 Chronic systolic (congestive) heart failure: Secondary | ICD-10-CM | POA: Diagnosis not present

## 2024-04-16 LAB — CUP PACEART REMOTE DEVICE CHECK
Battery Remaining Longevity: 110 mo
Battery Remaining Percentage: 95.5 %
Battery Voltage: 3.01 V
Brady Statistic AP VP Percent: 58 %
Brady Statistic AP VS Percent: 1 %
Brady Statistic AS VP Percent: 42 %
Brady Statistic AS VS Percent: 1 %
Brady Statistic RA Percent Paced: 58 %
Brady Statistic RV Percent Paced: 99 %
Date Time Interrogation Session: 20251202020033
Implantable Lead Connection Status: 753985
Implantable Lead Connection Status: 753985
Implantable Lead Implant Date: 20250721
Implantable Lead Implant Date: 20250721
Implantable Lead Location: 753859
Implantable Lead Location: 753860
Implantable Pulse Generator Implant Date: 20250721
Lead Channel Impedance Value: 390 Ohm
Lead Channel Impedance Value: 400 Ohm
Lead Channel Pacing Threshold Amplitude: 0.5 V
Lead Channel Pacing Threshold Amplitude: 0.625 V
Lead Channel Pacing Threshold Pulse Width: 0.5 ms
Lead Channel Pacing Threshold Pulse Width: 0.5 ms
Lead Channel Sensing Intrinsic Amplitude: 1.3 mV
Lead Channel Sensing Intrinsic Amplitude: 12 mV
Lead Channel Setting Pacing Amplitude: 0.75 V
Lead Channel Setting Pacing Amplitude: 1.625
Lead Channel Setting Pacing Pulse Width: 0.5 ms
Lead Channel Setting Sensing Sensitivity: 2 mV
Pulse Gen Model: 2272
Pulse Gen Serial Number: 8294240

## 2024-04-18 NOTE — Progress Notes (Signed)
 Remote PPM Transmission

## 2024-04-21 ENCOUNTER — Telehealth: Payer: Self-pay | Admitting: Cardiovascular Disease

## 2024-04-21 NOTE — Telephone Encounter (Signed)
 Spoke with pt, aware due to her TAVR, she will need to take 2 gm of amoxicillin  prior to dental appointments. She reports she has the tablets on hand and understands how to take.

## 2024-04-21 NOTE — Telephone Encounter (Signed)
 Patient is having a dental cleaning and check up  04/22/2024 and wants to know if  she needs to take antibiotics before. Please advise.

## 2024-04-24 ENCOUNTER — Ambulatory Visit (HOSPITAL_BASED_OUTPATIENT_CLINIC_OR_DEPARTMENT_OTHER)

## 2024-04-25 ENCOUNTER — Ambulatory Visit (HOSPITAL_BASED_OUTPATIENT_CLINIC_OR_DEPARTMENT_OTHER)

## 2024-04-30 ENCOUNTER — Telehealth: Payer: Self-pay | Admitting: Cardiovascular Disease

## 2024-04-30 ENCOUNTER — Ambulatory Visit: Admitting: Emergency Medicine

## 2024-04-30 NOTE — Telephone Encounter (Signed)
 Pt would like to know why Dr. Lucas helped with her surgery on 09/11/2023

## 2024-04-30 NOTE — Telephone Encounter (Signed)
 Per [name], PA: All TAVRs have to be done by an interventional cardiologist and a CT surgeon. I have never heard of one doctor not being covered. She call can [facility] billing if she has additional questions!   Spoke with patient in regards to insurance saying Dr. Lucas was out of network. Gave patient billing's number and forwarding message to them as well.  Pt verbalized understanding of plan.

## 2024-04-30 NOTE — Telephone Encounter (Signed)
 Patient states Dr. Lucas is out of network with her [employer] and she is asking why was he involved in her TAVR procedure in April.  Informed patient I would forward this message to the structural nurse navigator, Lauren, to follow-up with her about this.  Patient states if call is returned tomorrow (05/01/24) to call after 12:00 PM.

## 2024-05-11 ENCOUNTER — Ambulatory Visit (HOSPITAL_BASED_OUTPATIENT_CLINIC_OR_DEPARTMENT_OTHER)
Admission: RE | Admit: 2024-05-11 | Discharge: 2024-05-11 | Disposition: A | Discharge status: Home / Self Care | Source: Ambulatory Visit | Attending: Emergency Medicine | Admitting: Emergency Medicine

## 2024-05-11 DIAGNOSIS — R911 Solitary pulmonary nodule: Secondary | ICD-10-CM | POA: Diagnosis present

## 2024-05-16 ENCOUNTER — Telehealth: Payer: Self-pay

## 2024-05-16 ENCOUNTER — Telehealth: Payer: Self-pay | Admitting: Cardiovascular Disease

## 2024-05-16 NOTE — Telephone Encounter (Signed)
 Copied from CRM #[id number]. Topic: Clinical - Lab/Test Results >> May 14, 2024 12:30 PM Tara Hardin wrote: Reason for CRM: pt is requesting Hardin call back regarding CT results done on 05/11/24   Called and spoke with the pt and advised results have not been read by radiology.  Will contact pt once RB reviews them.

## 2024-05-16 NOTE — Telephone Encounter (Signed)
" °*  STAT* If patient is at the pharmacy, call can be transferred to refill team.   1. Which medications need to be refilled? (please list name of each medication and dose if known)  apixaban  (ELIQUIS ) 2.5 MG TABS tablet   2. Would you like to learn more about the convenience, safety, & potential cost savings by using the [facility]? no    3. Are you open to using the [facility] (Type [facility]. no   4. Which pharmacy/location (including street and city if local pharmacy) is medication to be sent to?  [facility] - [place], [place] - [address]     5. Do they need a 30 day or 90 day supply? 90 days  "

## 2024-05-18 ENCOUNTER — Ambulatory Visit: Payer: Self-pay | Admitting: Cardiology

## 2024-05-20 MED ORDER — APIXABAN 2.5 MG PO TABS: 2.5000 mg | ORAL_TABLET | Freq: Two times a day (BID) | ORAL | 1 refills | Status: AC

## 2024-05-20 NOTE — Telephone Encounter (Signed)
 Pt last saw Clint Fenton, PA on 03/24/24, last labs 03/24/24 Creat 1.24, age 84, weight 48.2kg, based on specified criteria pt is on appropriate dosage of Eliquis  2.5mg  BID for afib.  Will refill rx.

## 2024-05-22 ENCOUNTER — Ambulatory Visit: Attending: Student in an Organized Health Care Education/Training Program

## 2024-05-22 DIAGNOSIS — I5022 Chronic systolic (congestive) heart failure: Secondary | ICD-10-CM

## 2024-05-22 LAB — CUP PACEART INCLINIC DEVICE CHECK
Battery Remaining Longevity: 112 mo
Battery Voltage: 3.01 V
Brady Statistic RA Percent Paced: 57 %
Brady Statistic RV Percent Paced: 99.96 %
Date Time Interrogation Session: 20260108164727
Implantable Lead Connection Status: 753985
Implantable Lead Connection Status: 753985
Implantable Lead Implant Date: 20250721
Implantable Lead Implant Date: 20250721
Implantable Lead Location: 753859
Implantable Lead Location: 753860
Implantable Pulse Generator Implant Date: 20250721
Lead Channel Impedance Value: 400 Ohm
Lead Channel Impedance Value: 425 Ohm
Lead Channel Pacing Threshold Amplitude: 0.625 V
Lead Channel Pacing Threshold Amplitude: 1.125 V
Lead Channel Pacing Threshold Pulse Width: 0.5 ms
Lead Channel Pacing Threshold Pulse Width: 0.5 ms
Lead Channel Sensing Intrinsic Amplitude: 1.3 mV
Lead Channel Setting Pacing Amplitude: 1.25 V
Lead Channel Setting Pacing Amplitude: 1.625
Lead Channel Setting Pacing Pulse Width: 0.5 ms
Lead Channel Setting Sensing Sensitivity: 2 mV
Pulse Gen Model: 2272
Pulse Gen Serial Number: 8294240

## 2024-05-22 NOTE — Patient Instructions (Signed)
 Follow up as scheduled.

## 2024-05-22 NOTE — Progress Notes (Signed)
 Full device check not complete. Patient brought into device clinic per Dr. Kennyth d/t [facility] noted on remote transmission. Presenting rhythm AS/VP 72. 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  Patient previously seen by Dr. Kennyth on 03/11/2024 w/ AMS episodes c/w FFOS. In collaboration w/ [employer] Representative Atrial autosense was turned on during previous visit w/ max sensitivity @ 0.85mV.   Patient presented in-clinic today w/ initial PVAB programmed at w/ Atrial autosense turned ON. Attempt to reprogram PVAB to w/ Atrial sensitivity fixed @ 0.64mV. FFOS noted on presenting rhythm after making changes.  Final programming changes made per [employer] Representative (stated below) w/ no FFOS noted after making changes.   Programming Changes: - Atrial sensitivity reprogrammed from AUTO to FIXED @ 0.62mV. - PVAB reprogrammed from to . - Atrial Tachycardia Detection Rate reprogrammed from 170bpm to 140bpm.   Patient to F/U w/ Dr. Kennyth on 07/16/2024. Will continue to monitor and update accordingly.

## 2024-05-28 ENCOUNTER — Telehealth: Payer: Self-pay

## 2024-05-28 NOTE — Telephone Encounter (Signed)
 Copied from CRM [id number]. Topic: Clinical - Lab/Test Results >> May 28, 2024  4:01 PM Tara Hardin wrote: Reason for CRM: Patient would like Hardin call back in regards to her CT scan - Hardin little anxious about her results.   Callback number: [phone]   Please advise ct results

## 2024-05-28 NOTE — Telephone Encounter (Signed)
 Please let the patient know that her CT is stable compared with her prior. Good news. We can review further at her OV in February

## 2024-05-29 NOTE — Telephone Encounter (Signed)
 Patient aware.NFN

## 2024-06-02 ENCOUNTER — Ambulatory Visit: Payer: Self-pay | Admitting: Cardiology

## 2024-06-18 ENCOUNTER — Encounter: Payer: Self-pay | Admitting: Emergency Medicine

## 2024-06-18 ENCOUNTER — Ambulatory Visit: Admitting: Emergency Medicine

## 2024-06-18 VITALS — BP 126/69 | HR 64 | Temp 98.1°F | Ht 64.0 in | Wt 111.8 lb

## 2024-06-18 DIAGNOSIS — R911 Solitary pulmonary nodule: Secondary | ICD-10-CM | POA: Diagnosis not present

## 2024-06-18 NOTE — Patient Instructions (Addendum)
 We reviewed your CT scan of the chest today.  Your pulmonary nodule is stable in size and appearance.  Good news. We will plan to repeat your CT scan of the chest in December 2026. Your scan shows some evidence of possible liver inflammation that can sometimes be related to amiodarone .  Dr. Shelah will send a message to Drs. McAlhany and Kennyth to make sure that they are aware. Follow Dr. Shelah in December so we can review your scan.  Please call if you have any problems so we can see you sooner.

## 2024-06-18 NOTE — Assessment & Plan Note (Signed)
 7 mm apical left upper lobe with benign characteristics.  Stable over the 17-month interval.  Would like to follow this for 2 years total to ensure stability.  If no change over that time period then we should not need to continue surveillance imaging.  There was some hepatic hyperdensity noted in the radiology report that can sometimes correlate with amiodarone  use.  I will send a message to cardiology just to make sure they are aware.

## 2024-06-18 NOTE — Progress Notes (Signed)
 "  Subjective:    Patient ID: Tara Hardin, female    DOB: 12-27-1940, 84 y.o.   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  I saw her in November 2025 for 8 mm anterior left upper lobe pulmonary nodule seen on CT scan of the chest.  No new respiratory issues reported.  She is feeling well.  No cough.  Super D CT chest 05/11/2024 reviewed by me, shows no mediastinal or hilar adenopathy, stable 7 mm anterior apical left upper lobe nodule (no change), 3 mm perifissural left lower lobe nodule unchanged.  No new nodules or findings. Of note there was some liver density that may relate to amiodarone .    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 Susy and Dr. Lucas.   Severe aortic stenosis     Family History  Problem Relation Age of Onset   Lung cancer Mother        d. 69   Heart attack Sister    Hypertension Sister    Atrial fibrillation Brother    Hypertension Brother    Heart attack Brother    Irregular heart beat Brother    Breast cancer Paternal Aunt        dx after 74   Breast cancer Cousin        paternal female cousin     Social History   Socioeconomic History   Marital status: Married    Spouse name: Not on file   Number of children: 3   Years of education: Not on file   Highest education level: Not on file  Occupational  History   Occupation: Retired Information Systems Manager  Tobacco Use   Smoking status: Never   Smokeless tobacco: Never   Tobacco comments:    Never smoked 06/20/23  Vaping Use   Vaping status: Never Used  Substance and Sexual Activity   Alcohol use: Not Currently   Drug use: Never   Sexual activity: Not Currently    Birth control/protection: Post-menopausal  Other Topics Concern   Not on file  Social History Narrative   Not on file   Social Drivers of Health   Tobacco Use: Low Risk (06/18/2024)   Patient History    Smoking Tobacco Use: Never    Smokeless Tobacco Use: Never    Passive Exposure: Not on file  Financial Resource Strain: Not on file  Food Insecurity: Patient Declined (09/12/2023)   Hunger Vital Sign    Worried About Running Out of Food in the Last Year: Patient declined    Ran Out of Food in the Last Year: Patient declined  Transportation Needs: No Transportation Needs (12/04/2023)   Epic    Lack of Transportation (Medical): No    Lack of Transportation (Non-Medical): No  Physical Activity: Not on file  Stress: Not on file  Social  Connections: Moderately Integrated (12/04/2023)   Social Connection and Isolation Panel    Frequency of Communication with Friends and Family: More than three times a week    Frequency of Social Gatherings with Friends and Family: Three times a week    Attends Religious Services: More than 4 times per year    Active Member of Clubs or Organizations: Yes    Attends Banker Meetings: More than 4 times per year    Marital Status: Widowed  Intimate Partner Violence: Not At Risk (12/04/2023)   Epic    Fear of Current or Ex-Partner: No    Emotionally Abused: No    Physically Abused: No    Sexually Abused: No  Depression (PHQ2-9): Not on file  Alcohol Screen: Not on file  Housing: Low Risk (12/04/2023)   Epic    Unable to Pay for Housing in the Last Year: No    Number of Times Moved in the Last Year: 0    Homeless in the Last Year: No   Utilities: Not At Risk (12/04/2023)   Epic    Threatened with loss of utilities: No  Health Literacy: Not on file    No Known Allergies  Current Outpatient Medications on File Prior to Visit  Medication Sig Dispense Refill   acetaminophen  (TYLENOL ) 325 MG tablet Take 1-2 tablets (325-650 mg total) by mouth every 4 (four) hours as needed for mild pain (pain score 1-3).     amiodarone  (PACERONE ) 200 MG tablet Take 1 tablet (200 mg total) by mouth daily. 90 tablet 2   apixaban  (ELIQUIS ) 2.5 MG TABS tablet Take 1 tablet (2.5 mg total) by mouth 2 (two) times daily. 180 tablet 1   bismuth  subsalicylate (PEPTO BISMOL) 262 MG/15ML suspension Take 30 mLs by mouth every 6 (six) hours as needed for indigestion or diarrhea or loose stools.     CALCIUM PO Take 1 tablet by mouth daily.     cholecalciferol (VITAMIN D3) 25 MCG (1000 UNIT) tablet Take 1,000 Units by mouth daily.     letrozole  (FEMARA ) 2.5 MG tablet Take 1 tablet (2.5 mg total) by mouth daily. 90 tablet 3   Multiple Vitamin (MULTIVITAMIN PO) Take 1 tablet by mouth in the morning.     Multiple Vitamins-Minerals (HAIR SKIN AND NAILS FORMULA PO) Take 1 capsule by mouth 3 (three) times a week.     Omega-3 Fatty Acids (FISH OIL PO) Take 1 tablet by mouth every other day. Alternate between Fish Oil and Red Yeast Rice Tablets     Red Yeast Rice Extract (RED YEAST RICE PO) Take 1 tablet by mouth every other day. Alternate between Red Yeast Rice Tablets and Fish Oil.     spironolactone  (ALDACTONE ) 25 MG tablet Take 1 tablet (25 mg total) by mouth daily. 30 tablet 5   furosemide  (LASIX ) 20 MG tablet Take 1 tablet (20 mg total) by mouth daily as needed (FOR SWELLING, OR WEIGHT GAIN OF 3 POUNDS OVERNIGHT OR 5 POUNDS IN 1 WEEK). (Patient not taking: Reported on 06/18/2024) 30 tablet 5   ondansetron  (ZOFRAN -ODT) 4 MG disintegrating tablet Take 4 mg by mouth 3 (three) times daily as needed. (Patient not taking: Reported on 06/18/2024)     Oyster Shell Calcium  500 MG TABS 1 tablet with meals Orally Twice a day (Patient not taking: Reported on 06/18/2024)     sodium chloride  (OCEAN) 0.65 % nasal spray Place 1 spray into the nose every 8 (eight) hours as needed (Dry nasal  passages). (Patient not taking: Reported on 06/18/2024)     No current facility-administered medications on file prior to visit.       Objective:    Vitals:   06/18/24 1611  BP: 126/69  Pulse: 64  Temp: 98.1 F (36.7 C)  SpO2: 92%  Weight: 111 lb 12.8 oz (50.7 kg)  Height: 5' 4 (1.626 m)   Physical Exam Gen: Pleasant, very thin woman, in no distress,  normal affect  ENT: No lesions,  mouth clear,  oropharynx clear, no postnasal drip  Neck: No JVD, no stridor  Lungs: No use of accessory muscles, distant, no wheezes or crackles  Cardiovascular: RRR, 2/6 systolic murmur, no edema  Musculoskeletal: No deformities, no cyanosis or clubbing  Neuro: alert, awake, non focal  Skin: Warm, no lesions or rash       Assessment & Plan:   Pulmonary nodule 7 mm apical left upper lobe with benign characteristics.  Stable over the 23-month interval.  Would like to follow this for 2 years total to ensure stability.  If no change over that time period then we should not need to continue surveillance imaging.  There was some hepatic hyperdensity noted in the radiology report that can sometimes correlate with amiodarone  use.  I will send a message to cardiology just to make sure they are aware.   Return in about 10 months (around 04/28/2025) for With Dr. Shelah, To review CT scan of the chest.  I personally spent a total of 28 minutes in the care of the patient today including preparing to see the patient, getting/reviewing separately obtained history, performing a medically appropriate exam/evaluation, counseling and educating, placing orders, referring and communicating with other health care professionals, documenting clinical information in the EHR, independently interpreting  results, and communicating results.   Lamar Shelah, MD, PhD 06/18/2024, 4:45 PM [facility] [phone] or if no answer before 7:00PM call [phone] For any issues after 7:00PM please call eLink [phone]  "

## 2024-07-15 ENCOUNTER — Encounter

## 2024-07-16 ENCOUNTER — Ambulatory Visit: Admitting: Cardiology

## 2024-09-01 ENCOUNTER — Ambulatory Visit: Admitting: Physician Assistant

## 2024-09-01 ENCOUNTER — Other Ambulatory Visit (HOSPITAL_COMMUNITY)

## 2024-09-04 ENCOUNTER — Ambulatory Visit: Admitting: Physician Assistant

## 2024-09-04 ENCOUNTER — Other Ambulatory Visit (HOSPITAL_COMMUNITY)

## 2024-10-14 ENCOUNTER — Encounter

## 2025-01-07 ENCOUNTER — Ambulatory Visit: Admitting: Hematology and Oncology

## 2025-01-13 ENCOUNTER — Encounter
